# Patient Record
Sex: Female | Born: 2000 | Hispanic: No | Marital: Single | Smoking: Never smoker
Health system: Southern US, Academic
[De-identification: ages and names within clinical notes are randomized; demographics above are authoritative.]

## PROBLEM LIST (undated history)

## (undated) DIAGNOSIS — Z9889 Other specified postprocedural states: Secondary | ICD-10-CM

## (undated) DIAGNOSIS — F32A Depression, unspecified: Secondary | ICD-10-CM

## (undated) DIAGNOSIS — R110 Nausea: Secondary | ICD-10-CM

## (undated) DIAGNOSIS — N390 Urinary tract infection, site not specified: Secondary | ICD-10-CM

## (undated) DIAGNOSIS — E039 Hypothyroidism, unspecified: Secondary | ICD-10-CM

## (undated) DIAGNOSIS — Z87898 Personal history of other specified conditions: Secondary | ICD-10-CM

## (undated) DIAGNOSIS — S92909A Unspecified fracture of unspecified foot, initial encounter for closed fracture: Secondary | ICD-10-CM

## (undated) DIAGNOSIS — K589 Irritable bowel syndrome without diarrhea: Secondary | ICD-10-CM

## (undated) DIAGNOSIS — E669 Obesity, unspecified: Secondary | ICD-10-CM

## (undated) DIAGNOSIS — E063 Autoimmune thyroiditis: Secondary | ICD-10-CM

## (undated) DIAGNOSIS — Z86718 Personal history of other venous thrombosis and embolism: Secondary | ICD-10-CM

## (undated) DIAGNOSIS — R112 Nausea with vomiting, unspecified: Secondary | ICD-10-CM

## (undated) DIAGNOSIS — F17200 Nicotine dependence, unspecified, uncomplicated: Secondary | ICD-10-CM

## (undated) DIAGNOSIS — F411 Generalized anxiety disorder: Secondary | ICD-10-CM

## (undated) DIAGNOSIS — E559 Vitamin D deficiency, unspecified: Secondary | ICD-10-CM

## (undated) DIAGNOSIS — N809 Endometriosis, unspecified: Secondary | ICD-10-CM

## (undated) DIAGNOSIS — K5900 Constipation, unspecified: Secondary | ICD-10-CM

## (undated) DIAGNOSIS — F410 Panic disorder [episodic paroxysmal anxiety] without agoraphobia: Secondary | ICD-10-CM

## (undated) DIAGNOSIS — R519 Headache, unspecified: Secondary | ICD-10-CM

## (undated) DIAGNOSIS — G43909 Migraine, unspecified, not intractable, without status migrainosus: Secondary | ICD-10-CM

## (undated) DIAGNOSIS — F419 Anxiety disorder, unspecified: Secondary | ICD-10-CM

## (undated) HISTORY — DX: Unspecified fracture of unspecified foot, initial encounter for closed fracture: S92.909A

## (undated) HISTORY — PX: HX GALL BLADDER SURGERY/CHOLE: SHX55

## (undated) HISTORY — DX: Autoimmune thyroiditis: E06.3

## (undated) HISTORY — DX: Hypothyroidism, unspecified: E03.9

## (undated) HISTORY — PX: HX TONSILLECTOMY: SHX27

## (undated) HISTORY — PX: HX DENTAL EXTRACTION: 2100001168

## (undated) HISTORY — DX: Headache, unspecified: R51.9

## (undated) HISTORY — DX: Generalized anxiety disorder: F41.1

## (undated) HISTORY — PX: HX APPENDECTOMY: SHX54

## (undated) HISTORY — PX: HX TONSIL AND ADENOIDECTOMY: SHX28

## (undated) HISTORY — PX: GALLBLADDER SURGERY: SHX652

## (undated) HISTORY — DX: Obesity, unspecified: E66.9

## (undated) HISTORY — DX: Vitamin D deficiency, unspecified: E55.9

## (undated) HISTORY — PX: LAPAROSCOPIC ENDOMETRIOSIS FULGURATION: SUR769

## (undated) HISTORY — DX: Nausea: R11.0

## (undated) HISTORY — DX: Endometriosis, unspecified: N80.9

## (undated) HISTORY — PX: HX OTHER: 2100001105

## (undated) HISTORY — PX: HX FOOT SURGERY: 2100001154

## (undated) HISTORY — PX: FOOT SURGERY: SHX648

---
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
## 2018-02-09 ENCOUNTER — Encounter (INDEPENDENT_AMBULATORY_CARE_PROVIDER_SITE_OTHER): Payer: Self-pay | Admitting: NURSE PRACTITIONER

## 2018-03-01 ENCOUNTER — Encounter (INDEPENDENT_AMBULATORY_CARE_PROVIDER_SITE_OTHER): Payer: Self-pay | Admitting: Family Medicine

## 2018-03-08 ENCOUNTER — Telehealth (INDEPENDENT_AMBULATORY_CARE_PROVIDER_SITE_OTHER): Payer: Self-pay | Admitting: Family Medicine

## 2018-03-08 ENCOUNTER — Encounter (INDEPENDENT_AMBULATORY_CARE_PROVIDER_SITE_OTHER): Payer: Self-pay | Admitting: Family Medicine

## 2018-03-08 NOTE — Telephone Encounter (Signed)
Agree with going to ER.  Thank you

## 2018-03-08 NOTE — Telephone Encounter (Signed)
Call from patient, says that she was woken up at 2:00 am having severe stomach, low back and chest pain.  No shortness of breath, dizziness, arm pain etc.  Patient states that she does have someone at home that can drive her. Encouraged her to go to ER as soon as possible and to have someone drive her there. Stressed importance of having an examination by emergency care provider. Patient states understanding.

## 2018-03-08 NOTE — Telephone Encounter (Signed)
PT GAVE VERBAL CONSENT FOR CARE EVERYWHERE

## 2018-03-09 ENCOUNTER — Inpatient Hospital Stay (HOSPITAL_COMMUNITY)
Admission: EM | Admit: 2018-03-09 | Discharge: 2018-03-09 | Disposition: A | Discharge status: Home / Self Care | Payer: BC Managed Care – PPO | Source: Other Acute Inpatient Hospital

## 2018-03-09 DIAGNOSIS — R1031 Right lower quadrant pain: Secondary | ICD-10-CM

## 2018-03-09 NOTE — Telephone Encounter (Signed)
Agree with going to emergency room.  Thank you

## 2018-03-09 NOTE — Telephone Encounter (Signed)
Voicemail from patient, states she wants to talk about symptoms that she called in with yesterday.  Called patient, no answer, left message on machine for patient to call office back.

## 2018-03-09 NOTE — Telephone Encounter (Signed)
Call back from patient crying.  States that she went to [facility] yesterday and was told that she had appendicitis and if symptoms worsened to call PCP.  Patient says that she is having severe stomach and back pain and feels like she is going to vomit.  Advised patient to go to Ed immediately.  Patient states that she works for [employer] and is at work until 4 today.  Says that her boss told her she cannot leave until she finds someone to cover her shift.  Informed patient that she needs to go to ED for her own health and that this is something serious that needs to be taken care of. Patient begging for office to call her work because she says she cannot afford to lose her job.  Called [facility] in [place] and spoke with Alyssa to obtain records from yesterdays visit. Office notes from yesterday to be faxed. Reports obtained and scanned to chart.     Spoke with [employer] from [employer], advised of situation per patient.  Informed them that our office did not examine the patient.  We are going off of patients explanation of symptoms.  Staff and nurse in agreement that if patient has appendicitis and is having severe abdominal pain that she should report to ED.

## 2018-03-19 ENCOUNTER — Encounter (INDEPENDENT_AMBULATORY_CARE_PROVIDER_SITE_OTHER): Payer: Self-pay

## 2018-03-27 ENCOUNTER — Encounter (INDEPENDENT_AMBULATORY_CARE_PROVIDER_SITE_OTHER): Payer: Self-pay

## 2018-05-16 ENCOUNTER — Encounter (INDEPENDENT_AMBULATORY_CARE_PROVIDER_SITE_OTHER): Payer: Self-pay

## 2018-05-17 ENCOUNTER — Ambulatory Visit (INDEPENDENT_AMBULATORY_CARE_PROVIDER_SITE_OTHER): Payer: BC Managed Care – PPO

## 2018-05-17 ENCOUNTER — Encounter (INDEPENDENT_AMBULATORY_CARE_PROVIDER_SITE_OTHER): Payer: Self-pay

## 2018-05-17 VITALS — BP 124/88 | HR 80 | Temp 98.1°F | Ht 63.0 in | Wt 194.2 lb

## 2018-05-17 DIAGNOSIS — E559 Vitamin D deficiency, unspecified: Secondary | ICD-10-CM

## 2018-05-17 DIAGNOSIS — F419 Anxiety disorder, unspecified: Secondary | ICD-10-CM

## 2018-05-17 DIAGNOSIS — E669 Obesity, unspecified: Secondary | ICD-10-CM

## 2018-05-17 DIAGNOSIS — Z862 Personal history of diseases of the blood and blood-forming organs and certain disorders involving the immune mechanism: Secondary | ICD-10-CM

## 2018-05-17 MED ORDER — HYDROXYZINE HCL 10 MG TABLET
ORAL_TABLET | ORAL | 0 refills | Status: DC
Start: 2018-05-17 — End: 2018-05-28

## 2018-05-17 MED ORDER — ESCITALOPRAM 5 MG TABLET
5.0000 mg | ORAL_TABLET | Freq: Every day | ORAL | 0 refills | Status: DC
Start: 2018-05-17 — End: 2018-06-26

## 2018-05-17 NOTE — Nursing Note (Signed)
Pt here to discuss questions about her anxiety meds

## 2018-05-17 NOTE — Progress Notes (Signed)
[facility]  [address] [place] [address]  Phone: [phone]  Fax: [phone]    Encounter Date: 05/17/2018    Patient ID:  Traci [name]  HQR:[id number]    DOB: December 19, 2000  Age: 18 y.o. female    Subjective:     Chief Complaint   Patient presents with   . Medication Question     Pt here to discuss anxiety meds     Patient is here for acute visit. She complains of anxiety, onset "from high school". She was treated with Prozac and Zoloft in the past, but "did not feel right on medications". GAD7 is 16 today. Patient "worries about everything",  crying often, panic attacks+ (chest tightness, can not breathe, needs to go outside, lasting for few minutes). Patient works full time, will go back to school. Appetite: "some days I eat a little, some days  I overeat". Sleep: no insomnia, but due to anxiety sometimes it is difficult to fall asleep.  Depression, PHQ 9 is 13, no SI/SP/HI/HP. Patient denies ETOH/Drugs/tobacco use. She exercises.She denies periods of increased energy/insomnia/excessive spending/elevated mood.  History of anemia:no bleeding, not on supplements; history of Vit D deficiency; high BMI: started GYM 4 times a week, feels good when exercises, history of hypothyroidism: off medications for 2 years; endometriosis: UTD with GYN, can not take OCP, uses condoms, sex active with female partner, patient is working with her GYN closely.        No current outpatient medications on file.     Allergies   Allergen Reactions   . Sulfamethoxazole-Trimethoprim Hives/ Urticaria     Past Medical History:   Diagnosis Date   . Anxiety state    . Foot fracture    . Gastroesophageal reflux disease    . HA (headache)    . Hashimoto's thyroiditis    . Hemorrhage of right ovary    . Hypothyroidism    . Obesity          Past Surgical History:   Procedure Laterality Date   . GALLBLADDER SURGERY     . HX TONSIL AND ADENOIDECTOMY           Family Medical History:     None            Social  History     Tobacco Use   . Smoking status: Never Smoker   . Smokeless tobacco: Never Used   Substance Use Topics   . Alcohol use: Never     Frequency: Never   . Drug use: Never       Review of Systems   Constitutional: Positive for appetite change. Negative for activity change, chills, fatigue, fever and unexpected weight change.   HENT: Negative for congestion and sore throat.    Eyes: Negative for visual disturbance.   Respiratory: Negative for cough, shortness of breath and wheezing.    Cardiovascular: Negative.    Gastrointestinal: Negative for abdominal pain, constipation, diarrhea, nausea and vomiting.   Genitourinary: Negative for dysuria.   Musculoskeletal: Negative for joint swelling.   Skin: Negative for rash.   Neurological: Negative for dizziness and headaches.   Psychiatric/Behavioral: Positive for dysphoric mood and sleep disturbance. Negative for agitation, confusion, hallucinations, self-injury and suicidal ideas. The patient is nervous/anxious. The patient is not hyperactive.      Objective:   Vitals: BP 124/88 (Site: Left, Patient Position: Sitting, Cuff Size: Adult)   Pulse 80   Temp 36.7 C (98.1 F) (Tympanic)  Ht 1.6 m (5\' 3" )   Wt 88.1 kg (194 lb 3.2 oz)   SpO2 99%   BMI 34.40 kg/m   97 %ile (Z= 1.88) based on CDC (Girls, 2-20 Years) BMI-for-age based on BMI available as of 05/17/2018.      Physical Exam  Constitutional:       General: She is not in acute distress.     Appearance: She is not ill-appearing.   HENT:      Head: Normocephalic and atraumatic.      Right Ear: Tympanic membrane, ear canal and external ear normal.      Left Ear: Tympanic membrane, ear canal and external ear normal.      Mouth/Throat:      Mouth: Mucous membranes are moist.      Pharynx: Oropharynx is clear.   Eyes:      Conjunctiva/sclera: Conjunctivae normal.      Pupils: Pupils are equal, round, and reactive to light.   Neck:      Musculoskeletal: Normal range of motion.      Thyroid: No thyroid mass or  thyroid tenderness.      Comments: Thyroid fullness+  Cardiovascular:      Rate and Rhythm: Normal rate and regular rhythm.   Pulmonary:      Effort: Pulmonary effort is normal.      Breath sounds: Normal breath sounds.   Abdominal:      General: Bowel sounds are normal. There is no distension.      Palpations: Abdomen is soft.      Tenderness: There is no abdominal tenderness.      Comments: Old surgical scars (small)   Musculoskeletal:      Right lower leg: No edema.      Left lower leg: No edema.   Lymphadenopathy:      Cervical: No cervical adenopathy.   Skin:     General: Skin is warm and dry.      Findings: No rash.   Neurological:      General: No focal deficit present.      Mental Status: She is alert and oriented to person, place, and time.   Psychiatric:         Attention and Perception: Attention normal.         Mood and Affect: Mood normal.         Speech: Speech normal.         Behavior: Behavior normal.         Thought Content: Thought content normal.         Cognition and Memory: Cognition normal.         Assessment & Plan:     1. Anxiety  - OUTSIDE CONSULT/REFERRAL PROVIDER(AMB)  - TSH w/ Free T4 Reflex; Future  - CBC/DIFF; Future  - Comp Metabolic Panel-Fasting; Future  - hydrOXYzine HCl (ATARAX) 10 mg Oral Tablet; Use one tablet at bedtime as needed for anxiety  Dispense: 10 Tab; Refill: 0  - escitalopram oxalate (LEXAPRO) 5 mg Oral Tablet; Take 1 Tab (5 mg total) by mouth Once a day for 30 days  Dispense: 30 Tab; Refill: 0    2. Obesity (BMI 30.0-34.9)  - TSH w/ Free T4 Reflex; Future  - Comp Metabolic Panel-Fasting; Future    3. Vitamin D deficiency  - Vitamin D; Future    4. History of anemia  - CBC/DIFF; Future  - Ferritin; Future  - Comp Metabolic Panel-Fasting; Future  Gara Kroner, MD

## 2018-05-18 ENCOUNTER — Encounter (INDEPENDENT_AMBULATORY_CARE_PROVIDER_SITE_OTHER): Payer: Self-pay

## 2018-05-28 ENCOUNTER — Telehealth (INDEPENDENT_AMBULATORY_CARE_PROVIDER_SITE_OTHER): Payer: Self-pay | Admitting: Family

## 2018-05-28 DIAGNOSIS — F419 Anxiety disorder, unspecified: Secondary | ICD-10-CM

## 2018-05-28 MED ORDER — HYDROXYZINE HCL 10 MG TABLET
ORAL_TABLET | ORAL | 0 refills | Status: DC
Start: 2018-05-28 — End: 2018-06-26

## 2018-05-28 NOTE — Telephone Encounter (Signed)
Called patient, no answer, mailbox full and unable to leave message.

## 2018-05-28 NOTE — Telephone Encounter (Signed)
Pt called and made aware

## 2018-05-28 NOTE — Telephone Encounter (Signed)
Ok, I will send refill.

## 2018-05-28 NOTE — Telephone Encounter (Signed)
PT CALLED AND SAID THE HYDROXYZINE MEDS YOU GAVE HER FOR HER ANXIETY SEEMS TO BE WORKING VERY WELL.  YOU  HAD GIVEN HER A 10 DAY SUPPLY.  SHE HAD TO PUSH HER NEXT APPT BACK ABOUT A WEEK AND ASKED IF SHE COULD GET ANOTHER SHORT SUPPLY UNTIL SHE COMES IN FOR HER NEXT APPT.

## 2018-05-31 ENCOUNTER — Encounter (INDEPENDENT_AMBULATORY_CARE_PROVIDER_SITE_OTHER): Payer: Self-pay | Admitting: Family

## 2018-06-12 ENCOUNTER — Encounter (INDEPENDENT_AMBULATORY_CARE_PROVIDER_SITE_OTHER): Payer: Self-pay | Admitting: Family

## 2018-06-19 ENCOUNTER — Encounter (INDEPENDENT_AMBULATORY_CARE_PROVIDER_SITE_OTHER): Payer: Self-pay

## 2018-06-25 ENCOUNTER — Encounter (INDEPENDENT_AMBULATORY_CARE_PROVIDER_SITE_OTHER): Payer: Self-pay

## 2018-06-26 ENCOUNTER — Other Ambulatory Visit (INDEPENDENT_AMBULATORY_CARE_PROVIDER_SITE_OTHER): Payer: Self-pay | Admitting: Family Medicine

## 2018-06-26 DIAGNOSIS — F419 Anxiety disorder, unspecified: Secondary | ICD-10-CM

## 2018-06-26 MED ORDER — ESCITALOPRAM 5 MG TABLET
5.0000 mg | ORAL_TABLET | Freq: Every day | ORAL | 0 refills | Status: DC
Start: 2018-06-26 — End: 2018-07-03

## 2018-06-26 MED ORDER — HYDROXYZINE HCL 10 MG TABLET: Tab | ORAL | 0 refills | 0 days | Status: DC

## 2018-06-26 NOTE — Telephone Encounter (Signed)
Patient requesting refill of Lexapro and hydroxyzine. She tells me that she is completely out of medication.     Last OV: 05/17/18  Scheduled OV: 07/03/18  Last Filled:  05/17/18    Refills pended.

## 2018-07-03 ENCOUNTER — Encounter (INDEPENDENT_AMBULATORY_CARE_PROVIDER_SITE_OTHER): Payer: Self-pay

## 2018-07-03 ENCOUNTER — Ambulatory Visit (INDEPENDENT_AMBULATORY_CARE_PROVIDER_SITE_OTHER): Payer: BC Managed Care – PPO

## 2018-07-03 ENCOUNTER — Other Ambulatory Visit: Payer: Self-pay

## 2018-07-03 VITALS — BP 116/76 | HR 84 | Temp 97.6°F | Resp 16 | Ht 63.0 in | Wt 192.4 lb

## 2018-07-03 DIAGNOSIS — R1013 Epigastric pain: Principal | ICD-10-CM

## 2018-07-03 DIAGNOSIS — E669 Obesity, unspecified: Secondary | ICD-10-CM

## 2018-07-03 DIAGNOSIS — H538 Other visual disturbances: Secondary | ICD-10-CM

## 2018-07-03 DIAGNOSIS — F419 Anxiety disorder, unspecified: Secondary | ICD-10-CM

## 2018-07-03 DIAGNOSIS — N809 Endometriosis, unspecified: Secondary | ICD-10-CM

## 2018-07-03 LAB — COMPREHENSIVE METABOLIC PNL, FASTING
ALBUMIN: 4.3
ALKALINE PHOSPHATASE: 60
ALT (SGPT): 14
ANION GAP: 9
AST (SGOT): 17
BILIRUBIN, TOTAL: 0.4
BUN: 8
CALCIUM: 9.1
CARBON DIOXIDE: 22
CHLORIDE: 107
CREATININE: 0.7
ESTIMATED GLOMERULAR FILTRATION RATE: >60
GLUCOSE, FASTING: 82
POTASSIUM: 3.8
SODIUM: 138
SODIUM: 138
TOTAL PROTEIN: 7.5

## 2018-07-03 LAB — CBC/DIFF
BASOS ABS: 0
EOS ABS: 0.5
HCT: 38.5
HGB: 12.9
LYMPHS ABS: 21.2
MCH: 27.5
MCHC: 33.4
MCV: 82.2
MONOS ABS: 5.7
PLATELET COUNT: 280
RBC: 4.69
RDW: 13.9
WBC: 10.3

## 2018-07-03 LAB — THYROID STIMULATING HORMONE (SENSITIVE TSH): TSH: 1.48

## 2018-07-03 LAB — FERRITIN: FERRITIN: 22

## 2018-07-03 LAB — VITAMIN D: VITAMIN D: 23 ng/mL

## 2018-07-03 MED ORDER — ESCITALOPRAM 10 MG TABLET
10.0000 mg | ORAL_TABLET | Freq: Every day | ORAL | 0 refills | Status: DC
Start: 2018-07-03 — End: 2018-08-03

## 2018-07-03 MED ORDER — HYDROXYZINE HCL 10 MG TABLET
ORAL_TABLET | ORAL | 0 refills | Status: DC
Start: 2018-07-03 — End: 2018-11-03

## 2018-07-03 NOTE — Progress Notes (Signed)
[facility]  [address] [place] [address]  Phone: [phone]  Fax: [phone]    Encounter Date: 07/03/2018    Patient ID:  Traci [name]  SHU:[id number]    DOB: 2000-08-19  Age: 18 y.o. female    Subjective:     Chief Complaint   Patient presents with   . Follow Up     medication started   . Eye Redness     Left this morning     Patient is here for follow up, needs refill. She forgot to do her blood work.    Anxiety GAD 7 is 15, PHQ 9 is 10. Patient  feels a little better on Lexapro, no side effects. She takes Atarax as needed for    anxiety and insomnia due to it. Patient was referred to counseling, but did not go.    Patient will have laparocsopic surgery 08/01/2018 (endometriosis)      Patient reports abdominal (epigastric) pain after eating bread and pasta, occasionally nausea.      BM: constipation. She tried multiple medications. Patient is under GI care, will see them next week.      She reports left eye redness and blurry vision upon awakening today. She has contact lense.           Current Outpatient Medications   Medication Sig   . escitalopram oxalate (LEXAPRO) 5 mg Oral Tablet Take 1 Tab (5 mg total) by mouth Once a day   . gabapentin (NEURONTIN) 100 mg Oral Capsule TAKE 1 CAPSULE BY MOUTH THREE TIMES DAILY FOR 2 DAYS PRE-OP. THEN TAKE 1 TO 4 CAPSULES THREE TIMES DAILY POST-OP AS NEEDED FOR PAIN NOT ADEQ   . hydroxyzine HCL (ATARAX) 10 mg Oral Tablet Use one tablet at bedtime as needed for anxiety   . Ibuprofen (MOTRIN) 800 mg Oral Tablet TAKE ONE TABLET BY MOUTH EVERY 8 HOURS BEGINNING 2 DAYS PRIOR TO SURGERY AND CONTINUE FOLLOWING SURGERY AS NEEDED FOR PAIN CONTROL.     Allergies   Allergen Reactions   . Sulfamethoxazole-Trimethoprim Hives/ Urticaria     Past Medical History:   Diagnosis Date   . Anxiety state    . Foot fracture    . Gastroesophageal reflux disease    . HA (headache)    . Hashimoto's thyroiditis    . Hemorrhage of right ovary    .  Hypothyroidism    . Obesity          Past Surgical History:   Procedure Laterality Date   . FOOT SURGERY Right    . GALLBLADDER SURGERY     . HX TONSIL AND ADENOIDECTOMY           Family Medical History:     None            Social History     Tobacco Use   . Smoking status: Never Smoker   . Smokeless tobacco: Never Used   Substance Use Topics   . Alcohol use: Never     Frequency: Never   . Drug use: Never       Review of Systems   Constitutional: Negative.    HENT: Negative for congestion, ear pain, rhinorrhea, sore throat and trouble swallowing.    Eyes: Positive for redness and visual disturbance. Negative for photophobia, pain, discharge and itching.   Respiratory: Negative for cough, shortness of breath and wheezing.    Cardiovascular: Negative.    Gastrointestinal: Positive for abdominal pain,  constipation and nausea. Negative for blood in stool and vomiting.   Genitourinary: Negative for dysuria and hematuria.   Musculoskeletal: Negative for arthralgias.   Skin: Negative for rash.   Neurological: Negative for dizziness and headaches.   Psychiatric/Behavioral: Negative for confusion, hallucinations, self-injury and suicidal ideas. The patient is nervous/anxious.      Objective:   Vitals: BP 116/76 (Site: Left, Patient Position: Sitting, Cuff Size: Adult)   Pulse 84   Temp 36.4 C (97.6 F) (Tympanic)   Resp 16   Ht 1.6 m (5\' 3" )   Wt 87.3 kg (192 lb 6.4 oz)   SpO2 98%   BMI 34.08 kg/m   97 %ile (Z= 1.85) based on CDC (Girls, 2-20 Years) BMI-for-age based on BMI available as of 07/03/2018.      Physical Exam  Constitutional:       General: She is not in acute distress.     Appearance: She is obese.   HENT:      Head: Normocephalic and atraumatic.      Right Ear: Tympanic membrane, ear canal and external ear normal.      Left Ear: Tympanic membrane, ear canal and external ear normal.      Nose: Nose normal.      Mouth/Throat:      Mouth: Mucous membranes are moist.      Pharynx: Oropharynx is clear.      Eyes:      General: Lids are normal.         Right eye: No discharge.         Left eye: No discharge.      Conjunctiva/sclera:      Right eye: Right conjunctiva is not injected. No exudate.     Left eye: Left conjunctiva is injected. No exudate.     Pupils: Pupils are equal, round, and reactive to light.   Cardiovascular:      Rate and Rhythm: Normal rate and regular rhythm.      Pulses: Normal pulses.   Pulmonary:      Effort: Pulmonary effort is normal.      Breath sounds: Normal breath sounds.   Abdominal:      General: Bowel sounds are normal. There is no distension.      Palpations: Abdomen is soft.      Tenderness: There is abdominal tenderness. There is no guarding or rebound.      Comments: Mild epigastric, RUQ tenderness   Musculoskeletal:      Right lower leg: No edema.      Left lower leg: No edema.   Lymphadenopathy:      Cervical: No cervical adenopathy.   Skin:     General: Skin is warm and dry.      Findings: No rash.   Neurological:      General: No focal deficit present.      Mental Status: She is alert and oriented to person, place, and time.   Psychiatric:         Mood and Affect: Mood normal.         Behavior: Behavior normal.         Assessment & Plan:     1. Epigastric pain - mild, diet related, patient will see GI next week (established), she will continue diet (small frequent meals, to avoid bread/pasta)  - Celiac Screening Profile, IGA with Reflex to IGG, Serum; Future    2. Anxiety- Lexapro dose increased, Atarax as needed. She will do blood  work today.  - escitalopram oxalate (LEXAPRO) 10 mg Oral Tablet; Take 1 Tab (10 mg total) by mouth Once a day  Dispense: 30 Tab; Refill: 0  - hydroxyzine HCL (ATARAX) 10 mg Oral Tablet; Use one tablet at bedtime as needed for anxiety  Dispense: 30 Tab; Refill: 0    3. Obesity (BMI 30.0-34.9)    4. Blurry vision, left eye- for one day, patient will call her ophthalmologist today (uses contact lenses)    5. Endometriosis- follow up with GYN, for surgery  08/01/18        Gara Kroner, MD

## 2018-07-03 NOTE — Nursing Note (Signed)
Follow for starting medication, patient also states she woke up and her Left eye is pink and bothering her.

## 2018-07-04 ENCOUNTER — Other Ambulatory Visit (INDEPENDENT_AMBULATORY_CARE_PROVIDER_SITE_OTHER): Payer: Self-pay

## 2018-07-04 DIAGNOSIS — F419 Anxiety disorder, unspecified: Secondary | ICD-10-CM

## 2018-07-04 DIAGNOSIS — Z862 Personal history of diseases of the blood and blood-forming organs and certain disorders involving the immune mechanism: Secondary | ICD-10-CM

## 2018-07-04 DIAGNOSIS — E669 Obesity, unspecified: Secondary | ICD-10-CM

## 2018-07-05 ENCOUNTER — Encounter (INDEPENDENT_AMBULATORY_CARE_PROVIDER_SITE_OTHER): Payer: Self-pay

## 2018-07-06 ENCOUNTER — Telehealth (INDEPENDENT_AMBULATORY_CARE_PROVIDER_SITE_OTHER): Payer: Self-pay

## 2018-07-06 ENCOUNTER — Other Ambulatory Visit (INDEPENDENT_AMBULATORY_CARE_PROVIDER_SITE_OTHER): Payer: Self-pay

## 2018-07-06 DIAGNOSIS — Z862 Personal history of diseases of the blood and blood-forming organs and certain disorders involving the immune mechanism: Secondary | ICD-10-CM

## 2018-07-06 NOTE — Telephone Encounter (Signed)
Called patient, no answer, VM full and unable to leave message.

## 2018-07-06 NOTE — Telephone Encounter (Signed)
-----   Message from Gara Kroner, MD sent at 07/06/2018  9:46 AM EDT -----  Labs: CMP, TSH, CBC are normal. Ferritin low normal.

## 2018-07-09 ENCOUNTER — Other Ambulatory Visit (INDEPENDENT_AMBULATORY_CARE_PROVIDER_SITE_OTHER): Payer: Self-pay

## 2018-07-09 DIAGNOSIS — E559 Vitamin D deficiency, unspecified: Secondary | ICD-10-CM

## 2018-07-09 NOTE — Telephone Encounter (Signed)
Patient aware and verbalized understanding. °

## 2018-07-12 ENCOUNTER — Telehealth (INDEPENDENT_AMBULATORY_CARE_PROVIDER_SITE_OTHER): Payer: Self-pay | Admitting: Family Medicine

## 2018-07-12 NOTE — Telephone Encounter (Signed)
-----   Message from Peter Minium, MD sent at 07/09/2018  2:11 PM EDT -----  Vitamin-D is still low.  Is she taking vitamin-D supplement?  Thank you

## 2018-07-12 NOTE — Telephone Encounter (Signed)
VM full. Unable to leave message. Will try again later.

## 2018-07-16 NOTE — Telephone Encounter (Signed)
2nd attempt to contact patient   Unable to leave message,

## 2018-07-17 MED ORDER — ERGOCALCIFEROL (VITAMIN D2) 1,250 MCG (50,000 UNIT) CAPSULE: 50000 [IU] | Cap | ORAL | 1 refills | 0 days | Status: AC

## 2018-07-17 NOTE — Telephone Encounter (Signed)
Patient returned phone call. She is informed of results and states she has not been prescribed a Vitamin D supplement in approximately 8 months but would be willing to re-start, if necessary.

## 2018-07-17 NOTE — Telephone Encounter (Signed)
Patient aware and verbalized understanding. °

## 2018-07-17 NOTE — Telephone Encounter (Signed)
I sent a supplement to be taken weekly.  After 4-6 weeks please let us know if she is regularly taking it and will recheck level.  Thank you

## 2018-07-31 ENCOUNTER — Encounter (INDEPENDENT_AMBULATORY_CARE_PROVIDER_SITE_OTHER): Payer: Self-pay | Admitting: Internal Medicine

## 2018-07-31 ENCOUNTER — Telehealth (INDEPENDENT_AMBULATORY_CARE_PROVIDER_SITE_OTHER): Payer: Self-pay | Admitting: Family Medicine

## 2018-07-31 NOTE — Telephone Encounter (Signed)
I personally offered the service to the patient, and obtained verbal consent to provide this service.    Damian Leavell, MA    Pt has video call schedule for 4/10 @ 1:30. Call pt at [phone]

## 2018-08-02 ENCOUNTER — Encounter (INDEPENDENT_AMBULATORY_CARE_PROVIDER_SITE_OTHER): Payer: Self-pay | Admitting: Internal Medicine

## 2018-08-02 ENCOUNTER — Telehealth (INDEPENDENT_AMBULATORY_CARE_PROVIDER_SITE_OTHER): Payer: Self-pay | Admitting: Internal Medicine

## 2018-08-02 NOTE — Progress Notes (Signed)
[facility]  [address] [place] [address]    Video Visit     Name: Traci [name]  MRN: [id number]    Date: 08/03/2018  Age: 18 y.o.                             Patient's location: Home [name] [address]   Patient/family aware of provider location: Yes  Patient/family consent for video visit: Yes  Interview and observation performed by: Bufford Buttner, MD    Chief Complaint: Anxiety    History of Present Illness:  Traci [name] is a 18 y.o. female who has provided consent for telemedicine audiovisual two-way visit communication.    Visit is for follow-up regarding her anxiety.  She saw Dr. Kelton Pillar at her last appointment was treated with Lexapro and hydroxyzine for her anxiety.  Doses were adjusted.  She feels that the medication change has helped control symptoms and she feels calmer, but she still has some breakthrough complaints of anxiety.  She uses the hydroxyzine about 4 times per week.  She asked if the Lexapro dose can be increased to assist with better control of symptoms.  She denies any specific triggers that her causing symptoms.  No bad dreams.  She has not had any depressive symptoms.  She reports that she was treated in the past with fluoxetine.  She did not like how the medication made her feel so she did not want to restart the medication at her last appointment.  When the anxiety bothers her she reports that her chest hurts.  No complaint of palpitations.    Patient also is on vitamin-D supplement for a low vitamin-D level.  She has not had any injuries or fractures.  She asked about getting a follow-up lab test completed.    Patient is also concerned about urinary symptoms.  She complains of dysuria that started recently with lower abdominal/suprapubic pressure.  She has not noticed any blood in her urine.  She denies any fever or chills.  No flank pain.  Urine has seems somewhat cloudy.  She started drinking more water due to the onset of symptoms.    Past  Medical History:   Diagnosis Date   . Anxiety state    . Foot fracture     Right   . Gastroesophageal reflux disease 02/19/2014   . HA (headache)    . Hashimoto's thyroiditis    . Hemorrhage of right ovary 02/19/2015   . Hypothyroidism    . Obesity    . Vitamin D deficiency      Past Surgical History:   Procedure Laterality Date   . FOOT SURGERY Right    . GALLBLADDER SURGERY      Cholecystectomy   . HX OTHER  10/30/2015    DIAG LAPARO SEPARATE PROC - fulgration of endometriotic implants   . HX OTHER      ORIF Right foot   . HX TONSIL AND ADENOIDECTOMY       Family Medical History:     Problem Relation (Age of Onset)    No Known Problems Mother, Father        Social History     Tobacco Use   . Smoking status: Never Smoker   . Smokeless tobacco: Never Used   Substance Use Topics   . Alcohol use: Never     Frequency: Never   . Drug use: Never  Allergies   Allergen Reactions   . Sulfamethoxazole-Trimethoprim Hives/ Urticaria     Current Outpatient Medications   Medication Sig   . ergocalciferol, vitamin D2, (VITAMIN D) 1,250 mcg (50,000 unit) Oral Capsule Take 1 Cap (50,000 Units total) by mouth Every 7 days   . escitalopram oxalate (LEXAPRO) 10 mg Oral Tablet Take 1 Tab (10 mg total) by mouth Once a day   . hydroxyzine HCL (ATARAX) 10 mg Oral Tablet Use one tablet at bedtime as needed for anxiety   . Ibuprofen (MOTRIN) 800 mg Oral Tablet TAKE ONE TABLET BY MOUTH EVERY 8 HOURS BEGINNING 2 DAYS PRIOR TO SURGERY AND CONTINUE FOLLOWING SURGERY AS NEEDED FOR PAIN CONTROL.   . Norethindrone, Contraceptive, (equiv to: ORTHO MICRONOR) 0.35 mg Oral Tablet Take 0.35 mg by mouth Once a day     Patient Active Problem List   Diagnosis   . Anxiety   . Endometriosis   . Hashimoto's thyroiditis   . Iron deficiency   . Lisping   . Overweight (BMI 25.0-29.9)   . Pelvic pain   . Vitamin D deficiency     ROS:  Constitutional:  No fever or chills.  Eyes:  No eye drainage or redness.  Respiratory:  No cough or shortness of  breath.  Cardiovascular:  As noted above.  No edema.  GU:  As noted above.  Musculoskeletal:  No flank pain, injuries or fractures.  Skin:  No rashes or jaundice.  Psych/Behavioral:  Anxiety as noted above.    Observational Exam:   CONSTITUTIONAL:  Conversant and in no acute distress.  EYES:  No icterus.  No eye drainage or redness.  No lid-lag or proptosis.  RESPIRATORY:  Normal respiratory effort.  CARDIOVASCULAR:  No peripheral edema.  MUSCULOSKELETAL:  No cyanosis.  Moving upper and lower extremities without difficulty.  NEURO:  Cranial nerves II-XII grossly intact.  SKIN:  No rash, lesions or jaundice.  PSYCH:  Alert and oriented x3.  Cordial affect.  Did not appear anxious.  Conversant and responsive to questions.  Affect appeared upbeat.    Assessment/Plan:      ICD-10-CM    1. Anxiety F41.9 escitalopram oxalate (LEXAPRO) 20 mg Oral Tablet     ECG (External)   2. Vitamin D deficiency E55.9 VITAMIN D   3. UTI (urinary tract infection) N39.0 nitrofurantoin (MACROBID) 100 mg Oral Capsule   4. Other long term (current) drug therapy Z79.899 ECG (External)     Will change Lexapro to 20 mg daily for her anxiety.  She will continue use of hydroxyzine as needed.  Given use of this combination of medications, will check EKG to assess cardiac rhythm status.  Vitamin-D level ordered to follow-up regarding her vitamin-D deficiency.  Will treat with Macrobid 100 mg twice a day for UTI symptoms.  Encouraged patient to maintain good fluid intake.  Will plan for follow-up telemedicine video visit in 1 month or sooner as needed.  Bufford ButtnerLinda Zhanae Proffit, MD    This dictation was transcribed using voice recognition software and may contain minor typographical errors.

## 2018-08-03 ENCOUNTER — Other Ambulatory Visit: Payer: Self-pay

## 2018-08-03 ENCOUNTER — Encounter (INDEPENDENT_AMBULATORY_CARE_PROVIDER_SITE_OTHER): Payer: Self-pay | Admitting: Internal Medicine

## 2018-08-03 ENCOUNTER — Telehealth: Payer: BC Managed Care – PPO | Admitting: Internal Medicine

## 2018-08-03 ENCOUNTER — Encounter (INDEPENDENT_AMBULATORY_CARE_PROVIDER_SITE_OTHER): Payer: Self-pay

## 2018-08-03 DIAGNOSIS — Z79899 Other long term (current) drug therapy: Secondary | ICD-10-CM

## 2018-08-03 DIAGNOSIS — F419 Anxiety disorder, unspecified: Secondary | ICD-10-CM

## 2018-08-03 DIAGNOSIS — Z7189 Other specified counseling: Secondary | ICD-10-CM | POA: Insufficient documentation

## 2018-08-03 DIAGNOSIS — N390 Urinary tract infection, site not specified: Secondary | ICD-10-CM

## 2018-08-03 DIAGNOSIS — E559 Vitamin D deficiency, unspecified: Secondary | ICD-10-CM

## 2018-08-03 MED ORDER — ESCITALOPRAM 20 MG TABLET
20.0000 mg | ORAL_TABLET | Freq: Every day | ORAL | 1 refills | Status: DC
Start: 2018-08-03 — End: 2019-07-05

## 2018-08-03 MED ORDER — NITROFURANTOIN MONOHYDRATE/MACROCRYSTALS 100 MG CAPSULE
100.00 mg | ORAL_CAPSULE | Freq: Two times a day (BID) | ORAL | 0 refills | Status: AC
Start: 2018-08-03 — End: 2018-08-06

## 2018-08-03 NOTE — Telephone Encounter (Signed)
I personally offered the service to the patient, and obtained verbal consent to provide this service.    Traci [name]    Scheduled patient for 1 month follow up video visit on 09/03/2018 @ 3:15    Mailed scripts to patient.    Phone [phone]

## 2018-08-03 NOTE — Telephone Encounter (Signed)
Left message for patient to call back to schedule follow up video call.

## 2018-08-03 NOTE — Telephone Encounter (Signed)
Telemedicine video visit completed.  Please see check out instructions/orders.

## 2018-08-07 ENCOUNTER — Encounter (INDEPENDENT_AMBULATORY_CARE_PROVIDER_SITE_OTHER): Payer: Self-pay | Admitting: Family Medicine

## 2018-08-07 ENCOUNTER — Telehealth (INDEPENDENT_AMBULATORY_CARE_PROVIDER_SITE_OTHER): Payer: Self-pay | Admitting: Family Medicine

## 2018-08-07 NOTE — Telephone Encounter (Signed)
I personally offered the service to the patient, and obtained verbal consent to provide this service.    Traci [name]    Changed patients video visit to 08/08/2018 @ 2:30    Phone [phone]

## 2018-08-07 NOTE — Telephone Encounter (Signed)
I personally offered the service to the patient, and obtained verbal consent to provide this service.    Franciso Bend, RN    Patient reports constipation for the past 1.5 weeks. She has not had a normal BM in this time. She has been trying Miralax OTC but it has been ineffective.     Patient's number: [phone]  Pharmacy confirmed.     Patient has used the video chat on 08/03/18.

## 2018-08-08 ENCOUNTER — Encounter (INDEPENDENT_AMBULATORY_CARE_PROVIDER_SITE_OTHER): Payer: Self-pay | Admitting: Family Medicine

## 2018-08-22 ENCOUNTER — Telehealth (INDEPENDENT_AMBULATORY_CARE_PROVIDER_SITE_OTHER): Payer: Self-pay

## 2018-08-22 NOTE — Telephone Encounter (Signed)
Patient reports she has been vomiting and unable to keep any food or fluids down for the past three days except for small sips of water. She states she is in a lot of abdominal pain and sweating a lot. I advised her to report to the ED. She verbalized understanding.

## 2018-09-03 ENCOUNTER — Encounter (INDEPENDENT_AMBULATORY_CARE_PROVIDER_SITE_OTHER): Payer: Self-pay | Admitting: Internal Medicine

## 2018-09-13 ENCOUNTER — Encounter (INDEPENDENT_AMBULATORY_CARE_PROVIDER_SITE_OTHER): Payer: Self-pay | Admitting: Internal Medicine

## 2018-09-19 ENCOUNTER — Ambulatory Visit (INDEPENDENT_AMBULATORY_CARE_PROVIDER_SITE_OTHER): Payer: BC Managed Care – PPO | Admitting: Internal Medicine

## 2018-09-19 ENCOUNTER — Other Ambulatory Visit: Payer: Self-pay

## 2018-09-19 ENCOUNTER — Encounter (INDEPENDENT_AMBULATORY_CARE_PROVIDER_SITE_OTHER): Payer: Self-pay | Admitting: Internal Medicine

## 2018-09-19 VITALS — BP 122/74 | HR 74 | Temp 97.6°F | Ht 63.0 in | Wt 184.6 lb

## 2018-09-19 DIAGNOSIS — E669 Obesity, unspecified: Secondary | ICD-10-CM

## 2018-09-19 DIAGNOSIS — F419 Anxiety disorder, unspecified: Secondary | ICD-10-CM

## 2018-09-19 DIAGNOSIS — R21 Rash and other nonspecific skin eruption: Secondary | ICD-10-CM

## 2018-09-19 DIAGNOSIS — E559 Vitamin D deficiency, unspecified: Secondary | ICD-10-CM

## 2018-09-19 DIAGNOSIS — R197 Diarrhea, unspecified: Secondary | ICD-10-CM

## 2018-09-19 DIAGNOSIS — Z114 Encounter for screening for human immunodeficiency virus [HIV]: Secondary | ICD-10-CM

## 2018-09-19 MED ORDER — COLESTIPOL 1 GRAM TABLET
1.00 g | ORAL_TABLET | Freq: Two times a day (BID) | ORAL | 1 refills | Status: DC
Start: 2018-09-19 — End: 2018-11-03

## 2018-09-19 NOTE — Progress Notes (Signed)
[facility]  [address]  [place] [address]    Chief Complaint:    Chief Complaint   Patient presents with   . Follow Up     anxiety   . Rash     arms, back     HPI:  Patient is here for follow-up regarding chronic medical problems.  She has anxiety and is on Lexapro for treatment.  She states her family has noted that she seems more calm than she had been in the past and she seems to worry less about things.  She denies any depressive symptoms.  She does note that she sometimes feels anxious.  For example, school started last week and she was a bit anxious because she did have all of her books for her studies.  She does use hydroxyzine as needed.  She used several doses last week due to the school concerns but prior to that she was not using it very often.  She denies any side effects from medication.    Patient is on a vitamin-D supplement due to vitamin-D deficiency.  She has not had any injuries or fractures.  Weight remains elevated and in the obesity category.    Patient has a new complaint of a rash that occurs intermittently on her arms and back.  It seems to happen at night and not during the day.  It is pruritic and it looks red.  She does not feel that the areas of involvement look like hives.  She denies any new soaps, detergents or foods.  No new clothing or bed linens.  She has use Benadryl as needed for the pruritus which resolves the complained.  She does not have any residual the next day.  No one else in the household affected.    Patient also complains of diarrhea which occurs intermittently.  When it occurs she does have abdominal cramping.  She does have a history of cholecystectomy completed last year.  She has tried to watch her diet as it does seem worse with fried food intake.  Denies any vomiting although she has had some nausea at times.    Past Medical History:   Diagnosis Date   . Anxiety state    . Foot fracture     Right   . Gastroesophageal  reflux disease 02/19/2014   . HA (headache)    . Hashimoto's thyroiditis    . Hemorrhage of right ovary 02/19/2015   . Hypothyroidism    . Obesity    . Vitamin D deficiency      Past Surgical History:   Procedure Laterality Date   . FOOT SURGERY Right    . GALLBLADDER SURGERY      Cholecystectomy   . HX OTHER  10/30/2015    DIAG LAPARO SEPARATE PROC - fulgration of endometriotic implants   . HX OTHER      ORIF Right foot   . HX TONSIL AND ADENOIDECTOMY       Family Medical History:     Problem Relation (Age of Onset)    No Known Problems Mother, Father        Social History     Tobacco Use   . Smoking status: Never Smoker   . Smokeless tobacco: Never Used   Substance Use Topics   . Alcohol use: Never     Frequency: Never   . Drug use: Never     Allergies   Allergen Reactions   . Sulfamethoxazole-Trimethoprim  Hives/ Urticaria     Current Outpatient Medications   Medication Sig   . ergocalciferol, vitamin D2, (VITAMIN D) 1,250 mcg (50,000 unit) Oral Capsule Take 1 Cap (50,000 Units total) by mouth Every 7 days   . escitalopram oxalate (LEXAPRO) 20 mg Oral Tablet Take 1 Tab (20 mg total) by mouth Once a day   . hydroxyzine HCL (ATARAX) 10 mg Oral Tablet Use one tablet at bedtime as needed for anxiety   . Ibuprofen (MOTRIN) 800 mg Oral Tablet TAKE ONE TABLET BY MOUTH EVERY 8 HOURS BEGINNING 2 DAYS PRIOR TO SURGERY AND CONTINUE FOLLOWING SURGERY AS NEEDED FOR PAIN CONTROL.   . Norethindrone, Contraceptive, (equiv to: ORTHO MICRONOR) 0.35 mg Oral Tablet Take 0.35 mg by mouth Once a day     ROS:  Constitutional:  No fever or chills.  No recent illnesses.  No exposure to any individuals with coronavirus infection.  Eyes:  No eye drainage or redness.  Respiratory:  No cough or shortness of breath.  GI:  As noted above.  Skin:  As noted above.  No jaundice.  Psych/Behavioral:  Anxiety as noted above.  No depressive symptoms.  Allergic:  Rash as noted above with pruritus.  No urticaria.    Physical Exam:  BP 122/74 (Site:  Left, Patient Position: Sitting, Cuff Size: Adult)   Pulse 74   Temp 36.4 C (97.6 F) (Tympanic)   Ht 1.6 m (5\' 3" )   Wt 83.7 kg (184 lb 9.6 oz)   SpO2 98%   BMI 32.70 kg/m   96 %ile (Z= 1.73) based on CDC (Girls, 2-20 Years) BMI-for-age based on BMI available as of 09/19/2018.    Alert and pleasant.  No acute distress.  HEENT:  Pupils reactive without icterus.  No eye drainage.  TMs without erythema.  Nares patent without drainage.  Moist mucous membranes without erythema or exudate.  Neck:  Supple, nontender without adenopathy or masses.  Lungs:  Clear to auscultation bilaterally.  No wheezing, rales or rhonchi.  Chest wall expansion symmetrical.  No labored breathing.  No cough or tachypnea.  Cardiac:  Regular rate and rhythm.  No murmur, gallop or rub.  No ectopic beats.  No irregularity or tachycardia.  Abdomen:  Soft, nontender, non-distended with normoactive bowel sounds.  No appreciable organomegaly.  Extremities:  Warm with no cyanosis or edema.  No erythema.  Capillary refill less than 3 seconds.  Normal skin turgor.  Neuro:  Speech is clear and tongue is midline.  No facial asymmetry.  No tremor.  Ambulated without use of assistive device.  Skin:  No skin lesions on the back or upper extremities.  No jaundice.  Psych:  Answered questions appropriately.  Did not appear anxious.  Patient was alert and interactive during the visit.  Affect appeared upbeat.      ICD-10-CM    1. Anxiety F41.9    2. Vitamin D deficiency E55.9    3. Obesity (BMI 30.0-34.9) E66.9    4. Rash R21    5. Diarrhea, unspecified type - post cholecystectomy R19.7    6. Encounter for screening for HIV Z11.4 HIV1/HIV2 SCREEN, COMBINED ANTIGEN AND ANTIBODY     Will continue Lexapro for anxiety.  She may use hydroxyzine as needed for anxiety as needed and she may also use this medication for pruritus as well.  Would avoid use of Benadryl with use of the hydroxyzine.  Continue vitamin-D supplement given her vitamin-D deficiency.   Weight and BMI values reviewed.  BMI is in the obesity category.  Encouraged patient to continue working toward weight reduction.  No evidence of skin rash noted on exam today.  Patient did bring a photo on her cell phone today with evidence of mild erythema noted on the upper back region but does not seem typical for urticaria, pustules or vesicles.  Eyedrops design as needed for pruritus.  If symptoms would persist, consider referral to dermatology.  Recommended that she monitor her dietary intake to see if there is any particular food trigger.  Also recommended that she discuss with family members or her significant other regarding any new changes in products or colognes for example that may potentially be contributing to her complaints.  She has a history of cholecystectomy with complaint of diarrhea.  Will try a trial of Colestid 1 g twice a day.  Use the medication dosing reviewed with the patient.  HIV screen ordered.  Follow-up in 8 weeks or sooner as needed.    Bufford Buttner, MD    This dictation was transcribed using voice recognition software and may contain minor typographical errors.

## 2018-09-19 NOTE — Nursing Note (Signed)
Patient is here for a follow up for anxiety, mother notices improvements after starting medication    Also states she has a rash on her arms and back.

## 2018-10-05 ENCOUNTER — Encounter (INDEPENDENT_AMBULATORY_CARE_PROVIDER_SITE_OTHER): Payer: Self-pay | Admitting: Family Medicine

## 2018-10-06 ENCOUNTER — Inpatient Hospital Stay (HOSPITAL_COMMUNITY)
Admission: EM | Admit: 2018-10-06 | Discharge: 2018-10-06 | Disposition: A | Discharge status: Home / Self Care | Payer: BC Managed Care – PPO | Source: Other Acute Inpatient Hospital

## 2018-10-06 DIAGNOSIS — R1110 Vomiting, unspecified: Secondary | ICD-10-CM

## 2018-10-06 DIAGNOSIS — R10817 Generalized abdominal tenderness: Secondary | ICD-10-CM

## 2018-10-06 DIAGNOSIS — K651 Peritoneal abscess: Secondary | ICD-10-CM

## 2018-10-06 DIAGNOSIS — Z9889 Other specified postprocedural states: Secondary | ICD-10-CM | POA: Insufficient documentation

## 2018-10-08 ENCOUNTER — Encounter (INDEPENDENT_AMBULATORY_CARE_PROVIDER_SITE_OTHER): Payer: Self-pay | Admitting: Family Medicine

## 2018-10-09 ENCOUNTER — Encounter (INDEPENDENT_AMBULATORY_CARE_PROVIDER_SITE_OTHER): Payer: Self-pay | Admitting: Family Medicine

## 2018-10-13 DIAGNOSIS — Z8742 Personal history of other diseases of the female genital tract: Secondary | ICD-10-CM

## 2018-10-13 DIAGNOSIS — K651 Peritoneal abscess: Secondary | ICD-10-CM

## 2018-10-13 DIAGNOSIS — D72829 Elevated white blood cell count, unspecified: Secondary | ICD-10-CM

## 2018-10-13 DIAGNOSIS — Z4803 Encounter for change or removal of drains: Secondary | ICD-10-CM

## 2018-10-13 DIAGNOSIS — T8149XD Infection following a procedure, other surgical site, subsequent encounter: Secondary | ICD-10-CM

## 2018-10-15 ENCOUNTER — Encounter (INDEPENDENT_AMBULATORY_CARE_PROVIDER_SITE_OTHER): Payer: Self-pay | Admitting: Family Medicine

## 2018-10-15 MED ORDER — CEFDINIR 300 MG CAPSULE
300.00 mg | ORAL_CAPSULE | ORAL | Status: DC
Start: ? — End: 2018-10-15

## 2018-10-15 MED ORDER — GABAPENTIN 100 MG CAPSULE
100.00 mg | ORAL_CAPSULE | ORAL | Status: DC
Start: 2018-10-12 — End: 2018-10-15

## 2018-10-15 MED ORDER — FAMOTIDINE 20 MG TABLET
20.00 mg | ORAL_TABLET | ORAL | Status: DC
Start: 2018-10-12 — End: 2018-10-15

## 2018-10-15 MED ORDER — OXYCODONE 5 MG TABLET
5.00 mg | ORAL_TABLET | ORAL | Status: DC | PRN
Start: ? — End: 2018-10-15

## 2018-10-15 MED ORDER — ONDANSETRON HCL (PF) 4 MG/2 ML INJECTION SOLUTION
4.00 mg | Freq: Three times a day (TID) | INTRAMUSCULAR | Status: DC
Start: 2018-10-12 — End: 2018-10-15

## 2018-10-15 MED ORDER — ESCITALOPRAM 20 MG TABLET
20.00 mg | ORAL_TABLET | ORAL | Status: DC
Start: 2018-10-13 — End: 2018-10-15

## 2018-10-15 MED ORDER — METOCLOPRAMIDE 5 MG/ML INJECTION SOLUTION
5.00 mg | Freq: Four times a day (QID) | INTRAMUSCULAR | Status: DC | PRN
Start: ? — End: 2018-10-15

## 2018-10-15 MED ORDER — IBUPROFEN 600 MG TABLET
600.00 mg | ORAL_TABLET | Freq: Four times a day (QID) | ORAL | Status: DC
Start: 2018-10-12 — End: 2018-10-15

## 2018-10-15 MED ORDER — OXYCODONE 5 MG TABLET
10.00 mg | ORAL_TABLET | ORAL | Status: DC | PRN
Start: ? — End: 2018-10-15

## 2018-10-15 MED ORDER — METRONIDAZOLE 500 MG TABLET
500.00 mg | ORAL_TABLET | Freq: Two times a day (BID) | ORAL | Status: DC
Start: ? — End: 2018-10-15

## 2018-10-29 ENCOUNTER — Encounter (INDEPENDENT_AMBULATORY_CARE_PROVIDER_SITE_OTHER): Payer: Self-pay | Admitting: Family Medicine

## 2018-11-02 ENCOUNTER — Encounter (INDEPENDENT_AMBULATORY_CARE_PROVIDER_SITE_OTHER): Payer: Self-pay | Admitting: NURSE PRACTITIONER

## 2018-11-03 ENCOUNTER — Encounter (INDEPENDENT_AMBULATORY_CARE_PROVIDER_SITE_OTHER): Payer: Self-pay | Admitting: NURSE PRACTITIONER

## 2018-11-03 ENCOUNTER — Other Ambulatory Visit

## 2018-11-03 ENCOUNTER — Ambulatory Visit (INDEPENDENT_AMBULATORY_CARE_PROVIDER_SITE_OTHER): Payer: BC Managed Care – PPO | Admitting: NURSE PRACTITIONER

## 2018-11-03 VITALS — BP 112/76 | HR 87 | Temp 97.5°F | Ht 63.0 in | Wt 172.2 lb

## 2018-11-03 DIAGNOSIS — M791 Myalgia, unspecified site: Secondary | ICD-10-CM

## 2018-11-03 DIAGNOSIS — R112 Nausea with vomiting, unspecified: Secondary | ICD-10-CM

## 2018-11-03 DIAGNOSIS — Z09 Encounter for follow-up examination after completed treatment for conditions other than malignant neoplasm: Secondary | ICD-10-CM

## 2018-11-03 DIAGNOSIS — N9489 Other specified conditions associated with female genital organs and menstrual cycle: Secondary | ICD-10-CM

## 2018-11-03 DIAGNOSIS — Z5189 Encounter for other specified aftercare: Secondary | ICD-10-CM

## 2018-11-03 DIAGNOSIS — R634 Abnormal weight loss: Secondary | ICD-10-CM

## 2018-11-03 DIAGNOSIS — M2550 Pain in unspecified joint: Secondary | ICD-10-CM

## 2018-11-03 DIAGNOSIS — R5383 Other fatigue: Secondary | ICD-10-CM

## 2018-11-03 MED ORDER — FAMOTIDINE 10 MG TABLET
10.00 mg | ORAL_TABLET | Freq: Two times a day (BID) | ORAL | 0 refills | Status: DC
Start: 2018-11-03 — End: 2019-07-05

## 2018-11-03 MED ORDER — HONEY 80 % TOPICAL GEL
1.0000 | Freq: Two times a day (BID) | CUTANEOUS | 0 refills | Status: AC
Start: 2018-11-03 — End: 2018-11-17

## 2018-11-03 MED ORDER — PROMETHAZINE 12.5 MG RECTAL SUPPOSITORY
12.5000 mg | Freq: Four times a day (QID) | RECTAL | 0 refills | Status: DC | PRN
Start: 2018-11-03 — End: 2019-07-05

## 2018-11-03 NOTE — Nursing Note (Signed)
Pt in office following 2 laparoscopic procedures for endometriosis,  Surgeon states that the nausea and IBS symptoms are not from the surgeries.

## 2018-11-05 NOTE — Progress Notes (Signed)
General Exam    Subjective:    Traci [name] is an 18 y.o. female who is here for a hospital follow-up appointment.  She originally had a laparoscopic excision of endometriosis on October 03, 2018. She was discharged home and presented to the [facility] on October 06, 2018. She was transferred to [facility] with nausea, vomiting, and lower abdominal pain.  While in-patient she was treated for a presumed postoperative infection.  She is receiving IV antibiotics and her WBC increased in spite of antibiotic therapy and a repeat CT scan showed a pelvic hematoma.  Interventional radiology was unable to place a drain and she went back to the OR for a washout.  She had a JP drain placed for a pelvic hematoma in the left lower quadrant.  She was discharged home with 7 days of cefdinir and Flagyl.  She finished her course of antibiotics on Tuesday.  Since being discharged home on October 12, 2018 from the hospital she continues to have nausea and vomiting.  She has lost approximately 12 lbs since her last office visit on Sep 19, 2018. She is following with gynecology Dr. Franciso Bend at [facility].  She tells me Dr. Franciso Bend said something about the possibility of pyoderma gangrenosum.  She does have another CT scan of her abdomen scheduled for this coming Thursday which was ordered by her gynecologist.  She did have her JP drain removed from the left lower quadrant and has home care coming in once a week.  She tells me she has a small wound in the left lower quadrant of her abdomen.  She denies fever or chills.  For the nausea and vomiting she tells me Reglan did not help however, Zofran helps a little.  She says she is having fatigue, muscle soreness, and joint soreness all over.    Allergies   Allergen Reactions   . Sulfamethoxazole-Trimethoprim Hives/ Urticaria       Current Outpatient Medications:   .  escitalopram oxalate (LEXAPRO) 20 mg Oral Tablet, Take 1 Tab (20 mg total) by mouth Once a day,  Disp: 90 Tab, Rfl: 1  .  famotidine (PEPCID) 10 mg Oral Tablet, Take 1 Tab (10 mg total) by mouth Twice daily, Disp: 60 Tab, Rfl: 0  .  hydrOXYzine pamoate (VISTARIL) 25 mg Oral Capsule, Take 25 mg by mouth Three times a day as needed for Itching, Disp: , Rfl:   .  Ibuprofen (MOTRIN) 800 mg Oral Tablet, TAKE ONE TABLET BY MOUTH EVERY 8 HOURS BEGINNING 2 DAYS PRIOR TO SURGERY AND CONTINUE FOLLOWING SURGERY AS NEEDED FOR PAIN CONTROL., Disp: , Rfl:   .  Lactobac no.41/Bifidobact no.7 (PROBIOTIC-10 ORAL), Take by mouth, Disp: , Rfl:   .  leptospermum honey (MEDIHONEY, HONEY,) 80 % Gel, 1 Tube by Apply Topically route Twice daily for 14 days, Disp: 1 Tube, Rfl: 0  .  Norethindrone, Contraceptive, (equiv to: ORTHO MICRONOR) 0.35 mg Oral Tablet, Take 0.35 mg by mouth Once a day, Disp: , Rfl:   .  ondansetron (ZOFRAN) 4 mg Oral Tablet, Take 4 mg by mouth Every 8 hours as needed for Nausea/Vomiting, Disp: , Rfl:   .  promethazine (PHENERGAN) 12.5 mg Rectal Suppository, 1 Suppository (12.5 mg total) by Rectal route Every 6 hours as needed for Nausea/Vomiting, Disp: 14 Suppository, Rfl: 0  Past Medical History:   Diagnosis Date   . Anxiety state    . Endometriosis    . Foot fracture  Right   . Gastroesophageal reflux disease 02/19/2014   . HA (headache)    . Hashimoto's thyroiditis    . Hemorrhage of right ovary 02/19/2015   . Hypothyroidism    . Obesity    . Vitamin D deficiency          Past Surgical History:   Procedure Laterality Date   . FOOT SURGERY Right    . GALLBLADDER SURGERY      Cholecystectomy   . HX OTHER  10/30/2015    DIAG LAPARO SEPARATE PROC - fulgration of endometriotic implants   . HX OTHER      ORIF Right foot   . HX TONSIL AND ADENOIDECTOMY     . LAPAROSCOPIC ENDOMETRIOSIS FULGURATION     . LAPAROSCOPIC ENDOMETRIOSIS FULGURATION      2nd surgery was to also drain a blood clot.     Social History     Socioeconomic History   . Marital status: Single     Spouse name: Not on file   . Number of children:  Not on file   . Years of education: Not on file   . Highest education level: Not on file   Tobacco Use   . Smoking status: Never Smoker   . Smokeless tobacco: Never Used   Substance and Sexual Activity   . Alcohol use: Never     Frequency: Never   . Drug use: Never   . Sexual activity: Yes     Birth control/protection: Condom       ROS:  General:  + weight loss and fatigue.  Denies fevers or chills.  Eyes:  Denies visual changes.  HENT:  Denies nasal congestion, hearing loss, ear pain, sore throat.  Neck:  Denies neck fullness, pain.  Heart:  Denies chest pain, palpitations.  Lungs:  Denies SOB, pleuritic pain, cough.  Gastrointestinal:  + nausea and vomiting.  Denies any abd pain, diarrhea, or constipation.  Genitourinary: Denies any dysuria or hematuria  Musculoskeletal: + any joint pain, + muscle pain. Denies  edema.  Neurologic: Denies headaches, numbness, tingling.  Integumentary : Denies any rashes or lesions.  + abdominal wound LLQ.     Otherwise all other review of systems are negative    Objective:    Physical Exam:  Vitals:    11/03/18 0920   BP: 112/76   Pulse: 87   Temp: 36.4 C (97.5 F)   TempSrc: Thermal Scan   SpO2: 98%   Weight: 78.1 kg (172 lb 3.2 oz)   Height: 1.6 m (5\' 3" )   BMI: 30.57       General:  Well-appearing in no acute distress.  Skin:  Warm, dry, normal.  Without rash, lesion.  Eyes:  Pupils are reactive to light.  EOM intact.  Conjunctiva pink.  No scleral icterus.  HENT:  Gross hearing intact.  Ear canals without cerumen impaction.  Tympanic membranes without erythema, effusion.  No pharyngeal lesions, exudate.  Tongue midline.  Neck:  Supple.  No JVD, thyromegaly, lymphadenopathy.  No carotid bruits.  Heart:  RRR.  No murmur, heave, lift, thrill.  Heart sounds are normal.  No S3, S4 gallop.  Lungs:  Lungs are clear to auscultation bilaterally.  No wheezes, crackles, rubs.  Abdomen:  Nontender.  No hepatomegaly, masses, bruits.  Bowel sounds are normal. + 1 cm wound LLQ from removed  JP site.  There is minimal clear drainage.  No erythema, edema, or odor.  Musculoskeletal:  Back is nontender to  palpation.  No clubbing, cyanosis, edema.  Neuro:  Alert, oriented, cooperative.  Gait is stable.  No focal findings.        Assessment and Plan:  Orders Placed This Encounter   . [facility]PN EXTERNAL GI PROCEDURES   . Rheumatoid Factor   . Cyclic Citrullinated Peptide Antibodies, IGG   . SEDIMENTATION RATE   . C-REACTIVE PROTEIN(CRP),INFLAMMATION   . ANA (ANTINUCLEAR ANTIBODIES), SERUM   . CBC/DIFF   . COMPREHENSIVE METABOLIC PNL, FASTING   . COMPREHENSIVE METABOLIC PNL, FASTING   . promethazine (PHENERGAN) 12.5 mg Rectal Suppository   . famotidine (PEPCID) 10 mg Oral Tablet   . leptospermum honey (MEDIHONEY, HONEY,) 80 % Gel     (N94.89) Pelvic hematoma in female  (primary encounter diagnosis)  Plan: COMPREHENSIVE METABOLIC PNL, FASTING,         COMPREHENSIVE METABOLIC PNL, FASTING        Patient advised to proceed with CT scan as scheduled this Thursday and follow-up with gynecology as scheduled.    (R53.83) Fatigue, unspecified type  Plan: Rheumatoid Factor, Cyclic Citrullinated Peptide        Antibodies, IGG, SEDIMENTATION RATE, C-REACTIVE        PROTEIN(CRP),INFLAMMATION, ANA (ANTINUCLEAR         ANTIBODIES), SERUM, CBC/DIFF, COMPREHENSIVE         METABOLIC PNL, FASTING        Patient advised to have blood work completed as ordered.    (M79.10) Myalgia  Plan: Rheumatoid Factor, Cyclic Citrullinated Peptide        Antibodies, IGG, SEDIMENTATION RATE, C-REACTIVE        PROTEIN(CRP),INFLAMMATION, ANA (ANTINUCLEAR         ANTIBODIES), SERUM, CBC/DIFF, COMPREHENSIVE         METABOLIC PNL, FASTING        Patient advised to have blood work completed as ordered.    (M25.50) Arthralgia, unspecified joint  Plan: Rheumatoid Factor, Cyclic Citrullinated Peptide        Antibodies, IGG, SEDIMENTATION RATE, C-REACTIVE        PROTEIN(CRP),INFLAMMATION, ANA (ANTINUCLEAR         ANTIBODIES), SERUM, CBC/DIFF, COMPREHENSIVE          METABOLIC PNL, FASTING        Patient advised to have blood work completed as ordered.    (R63.4) Weight loss  Plan: [facility]PN EXTERNAL GI PROCEDURES, COMPREHENSIVE         METABOLIC PNL, FASTING        Patient advised to have blood work completed as ordered.  EGD ordered.  Patient was referred to Dr[facility] Kem[facility]ke at [facility].    (R11.2) Nausea & vomiting  Plan: [facility] EXTERNAL GI PROCEDURES, COMPREHENSIVE         METABOLIC PNL, FASTING        Pepcid sent to the pharmacy for the patient.  Patient was advised to proceed with EGD.  She was given a prescription for Phenergan suppositories today to help with nausea.    [facility](Z09) Hospital discharge follow-up  Plan: CBC/DIFF, [facility]PN EXTERNAL GI PROCEDURES,         COMPREHENSIVE METABOLIC PNL, FASTING,         COMPREHENSIVE METABOLIC PNL, FASTING        Patient advised to follow-up with gynecology as scheduled.  I did reconcile her discharge medication list.  Patient advised to stop taking Reglan since it is not helping.  Phenergan suppositories sent to the pharmacy for the patient.  I also sent Pepcid to  the pharmacy for her.  She has completed her course of antibiotic therapy.  Medihoney was sent to the pharmacy for the wound of the left lower quadrant.  A work excuse was provided that she will need further workup and may need days off.    (Z51.89) Visit for wound check  Plan:  Medihoney sent to the pharmacy for the patient.  Continue with once a week home care.      Follow Up:  Return in 6 weeks or if symptoms worsen or fail to improve.  Patient instructed to call the office with questions or concerns in the interim.    This dictation was transcribed using voice recognition software and may contain minor typographical errors.

## 2018-11-07 ENCOUNTER — Telehealth (INDEPENDENT_AMBULATORY_CARE_PROVIDER_SITE_OTHER): Payer: Self-pay | Admitting: NURSE PRACTITIONER

## 2018-11-07 LAB — CBC/DIFF
HCT: 35.9
HGB: 12
MCH: 26.5
MCHC: 33.5
MCV: 79.1
MPV: 10.5
PLATELET COUNT: 275
RBC: 4.54
RDW: 14.9
WBC: 9.4

## 2018-11-07 LAB — C-REACTIVE PROTEIN (CRP): C-REACTIVE PROTEIN: 6.8

## 2018-11-07 LAB — RHEUMATOID FACTOR: RHEUMATOID FACTOR: <14

## 2018-11-07 LAB — COMPREHENSIVE METABOLIC PNL, FASTING
ALBUMIN: 4.7
ALKALINE PHOSPHATASE: 64
ALT (SGPT): 15
ANION GAP: 9
AST (SGOT): 17
BILIRUBIN, TOTAL: 0.3
BUN: 9
CALCIUM: 9.7
CARBON DIOXIDE: 24
CHLORIDE: 108
CREATININE: 0.8
ESTIMATED GLOMERULAR FILTRATION RATE: >60
GLUCOSE, FASTING: 89
POTASSIUM: 4.2
SODIUM: 141
TOTAL PROTEIN: 8.1

## 2018-11-07 LAB — ANTINUCLEAR ANTIBODIES, DIRECT - LABCORP: ANA DIRECT: NEGATIVE

## 2018-11-07 LAB — CYCLIC CITRULLINATED PEPTIDE ANTIBODIES, IGG, SERUM: ANTI-CCP: <16

## 2018-11-07 LAB — SEDIMENTATION RATE: SEDIMENTATION RATE: 26

## 2018-11-07 NOTE — Telephone Encounter (Signed)
PT WAS GIVEN A REFERRAL TO DR. Waverly ([facility]).  REFERRAL, DEMOGRAPHICS, INSURANCE CARD AND OFFICE NOTES WERE FAXED TO DR. CLARK'S OFFICE.

## 2018-11-08 ENCOUNTER — Other Ambulatory Visit (INDEPENDENT_AMBULATORY_CARE_PROVIDER_SITE_OTHER): Payer: Self-pay

## 2018-11-08 ENCOUNTER — Other Ambulatory Visit (INDEPENDENT_AMBULATORY_CARE_PROVIDER_SITE_OTHER): Payer: Self-pay | Admitting: NURSE PRACTITIONER

## 2018-11-08 DIAGNOSIS — M791 Myalgia, unspecified site: Secondary | ICD-10-CM

## 2018-11-08 DIAGNOSIS — R634 Abnormal weight loss: Secondary | ICD-10-CM

## 2018-11-08 DIAGNOSIS — R112 Nausea with vomiting, unspecified: Secondary | ICD-10-CM

## 2018-11-08 DIAGNOSIS — R5383 Other fatigue: Secondary | ICD-10-CM

## 2018-11-08 DIAGNOSIS — M2550 Pain in unspecified joint: Secondary | ICD-10-CM

## 2018-11-08 DIAGNOSIS — Z09 Encounter for follow-up examination after completed treatment for conditions other than malignant neoplasm: Secondary | ICD-10-CM

## 2018-11-08 DIAGNOSIS — N9489 Other specified conditions associated with female genital organs and menstrual cycle: Secondary | ICD-10-CM

## 2018-11-09 ENCOUNTER — Telehealth (INDEPENDENT_AMBULATORY_CARE_PROVIDER_SITE_OTHER): Payer: Self-pay | Admitting: NURSE PRACTITIONER

## 2018-11-09 DIAGNOSIS — Z20822 Contact with and (suspected) exposure to covid-19: Secondary | ICD-10-CM

## 2018-11-09 DIAGNOSIS — Z20828 Contact with and (suspected) exposure to other viral communicable diseases: Secondary | ICD-10-CM

## 2018-11-09 NOTE — Telephone Encounter (Signed)
Called and spoke with patient, advised testing ordered. Patient says that she will most likely go on Monday to have testing. Script faxed to [facility].

## 2018-11-09 NOTE — Telephone Encounter (Signed)
Call from patient asking for script for COVID 19 testing.     She states that her coworker developed symptoms on 11/05/18, was tested on 11/05/18 and received positive result on 11/08/18.  Patient reports that she was around this coworker multiple days from 10/29/18-11/08/18. Patient was in the office on 11/03/2018, [place] notified.     Also, patient reports that her aunt developed symptoms on 11/06/18 and was tested, she was at her aunts house on 11/07/2018.  Aunt received notification of positive result on 11/08/2018.

## 2018-11-09 NOTE — Telephone Encounter (Signed)
Covid testing ordered, thank you.

## 2018-11-12 LAB — COVID-19 SCREENING - SEND-OUT: SARS CORONAVIRUS RNA: NOT DETECTED

## 2018-11-13 ENCOUNTER — Other Ambulatory Visit (INDEPENDENT_AMBULATORY_CARE_PROVIDER_SITE_OTHER): Payer: Self-pay

## 2018-11-13 ENCOUNTER — Encounter (INDEPENDENT_AMBULATORY_CARE_PROVIDER_SITE_OTHER): Payer: Self-pay | Admitting: NURSE PRACTITIONER

## 2018-11-13 DIAGNOSIS — R5383 Other fatigue: Secondary | ICD-10-CM

## 2018-11-13 DIAGNOSIS — M2550 Pain in unspecified joint: Secondary | ICD-10-CM

## 2018-11-13 DIAGNOSIS — M791 Myalgia, unspecified site: Secondary | ICD-10-CM

## 2018-11-14 ENCOUNTER — Ambulatory Visit (INDEPENDENT_AMBULATORY_CARE_PROVIDER_SITE_OTHER): Payer: Self-pay | Admitting: Internal Medicine

## 2018-11-14 ENCOUNTER — Encounter (INDEPENDENT_AMBULATORY_CARE_PROVIDER_SITE_OTHER): Payer: Self-pay | Admitting: Internal Medicine

## 2018-11-16 ENCOUNTER — Other Ambulatory Visit (INDEPENDENT_AMBULATORY_CARE_PROVIDER_SITE_OTHER): Payer: Self-pay

## 2018-11-16 DIAGNOSIS — Z20822 Contact with and (suspected) exposure to covid-19: Secondary | ICD-10-CM

## 2018-11-16 DIAGNOSIS — Z20828 Contact with and (suspected) exposure to other viral communicable diseases: Secondary | ICD-10-CM

## 2018-11-20 ENCOUNTER — Ambulatory Visit (INDEPENDENT_AMBULATORY_CARE_PROVIDER_SITE_OTHER): Payer: Self-pay | Admitting: Internal Medicine

## 2018-12-03 ENCOUNTER — Telehealth (INDEPENDENT_AMBULATORY_CARE_PROVIDER_SITE_OTHER): Payer: Self-pay | Admitting: NURSE PRACTITIONER

## 2018-12-03 DIAGNOSIS — Z20828 Contact with and (suspected) exposure to other viral communicable diseases: Secondary | ICD-10-CM

## 2018-12-03 DIAGNOSIS — R519 Headache, unspecified: Secondary | ICD-10-CM

## 2018-12-03 DIAGNOSIS — Z20822 Contact with and (suspected) exposure to covid-19: Secondary | ICD-10-CM

## 2018-12-03 DIAGNOSIS — H9209 Otalgia, unspecified ear: Secondary | ICD-10-CM

## 2018-12-03 NOTE — Telephone Encounter (Signed)
COVID-19 testing ordered.  Patient advised to quarantine per CDC guidelines.  May provide work excuse if needed.

## 2018-12-03 NOTE — Telephone Encounter (Signed)
Call from patient stating that she was around her cousins on Saturday and they received results today that they are positive for COVID 19.  Patient states that when she woke up this morning she had an ear ache and a headache.  Patient asking for script to be tested for COVID.

## 2018-12-03 NOTE — Telephone Encounter (Signed)
Attempted to contact patient- no answer and unable to leave message r/t VM full    Script faxed to [facility] test site

## 2018-12-17 ENCOUNTER — Encounter (INDEPENDENT_AMBULATORY_CARE_PROVIDER_SITE_OTHER): Payer: Self-pay | Admitting: NURSE PRACTITIONER

## 2018-12-26 ENCOUNTER — Telehealth (INDEPENDENT_AMBULATORY_CARE_PROVIDER_SITE_OTHER): Payer: Self-pay | Admitting: NURSE PRACTITIONER

## 2018-12-26 NOTE — Telephone Encounter (Signed)
Call from patient stating that she is having pain and burning with urination and believes she has a UTI.  Patient asking for script to [facility].  Advised patient that there are new rules that she may need office visit.  However, per 12/03/18 telephone encounter the patient stated that she had exposure to [place].  Asked patient if she had covid testing completed.  She says that she did not because she did not know anyone called her back.  Patient says that family she was exposed to now tested negative, but patient never had testing herself.  Patient denies symptoms at this time.  Let her know would send message to provider before scheduling.

## 2018-12-27 ENCOUNTER — Encounter (INDEPENDENT_AMBULATORY_CARE_PROVIDER_SITE_OTHER): Payer: Self-pay | Admitting: Family Medicine

## 2018-12-27 ENCOUNTER — Other Ambulatory Visit: Payer: Self-pay

## 2018-12-27 ENCOUNTER — Encounter (INDEPENDENT_AMBULATORY_CARE_PROVIDER_SITE_OTHER): Payer: Self-pay | Admitting: NURSE PRACTITIONER

## 2018-12-27 ENCOUNTER — Ambulatory Visit (INDEPENDENT_AMBULATORY_CARE_PROVIDER_SITE_OTHER): Payer: BC Managed Care – PPO | Admitting: NURSE PRACTITIONER

## 2018-12-27 VITALS — BP 114/86 | HR 89 | Temp 96.8°F | Ht 63.0 in | Wt 181.0 lb

## 2018-12-27 DIAGNOSIS — R112 Nausea with vomiting, unspecified: Secondary | ICD-10-CM

## 2018-12-27 DIAGNOSIS — R300 Dysuria: Secondary | ICD-10-CM

## 2018-12-27 DIAGNOSIS — N926 Irregular menstruation, unspecified: Secondary | ICD-10-CM

## 2018-12-27 DIAGNOSIS — R102 Pelvic and perineal pain: Secondary | ICD-10-CM

## 2018-12-27 LAB — POCT URINE DIPSTICK
BLOOD: NEGATIVE
LEUKOCYTES: NEGATIVE
NITRITE: NEGATIVE
PH: 7.5
PROTEIN: NEGATIVE
SPECIFIC GRAVITY: 1.005

## 2018-12-27 LAB — POCT URINE HCG: PREGNANCY, URINE: NEGATIVE

## 2018-12-27 MED ORDER — PHENAZOPYRIDINE 100 MG TABLET
100.00 mg | ORAL_TABLET | Freq: Three times a day (TID) | ORAL | 0 refills | Status: DC | PRN
Start: 2018-12-27 — End: 2019-07-05

## 2018-12-27 MED ORDER — NITROFURANTOIN MONOHYDRATE/MACROCRYSTALS 100 MG CAPSULE
100.00 mg | ORAL_CAPSULE | Freq: Two times a day (BID) | ORAL | 0 refills | Status: AC
Start: 2018-12-27 — End: 2019-01-03

## 2018-12-27 NOTE — Telephone Encounter (Signed)
She may come in for office visit if able.  Thank you.

## 2018-12-27 NOTE — Nursing Note (Signed)
12/27/18 1500   Urine test  (S[employer] Multistix 10 SG)   Time collected 1549   Color Yellow   Clarity Clear   Glucose Negative   Bilirubin Negative   Ketones (!) Moderate (40 mg/dl)   Urine Specific Gravity 1.005   Blood (urine) Negative   pH 7.5   Protein Negative   Urobilinogen Normal    Nitrite Negative   Leukocytes Negative   Lot # [id number]   Expiration Date 02/23/20   Initials as

## 2018-12-27 NOTE — Nursing Note (Signed)
12/27/18 1500   HGB   Time Performed 1552   HGB g/dl neg   LOT # [id number]   Expiration Date 02/23/20

## 2018-12-27 NOTE — Nursing Note (Signed)
12/27/18 1500   Urine test  (Siemens Multistix 10 SG)   Time collected 1549   Color Yellow   Clarity Clear   Glucose Negative   Bilirubin Negative   Ketones (!) Moderate (40 mg/dl)   Urine Specific Gravity 1.005   Blood (urine) Negative   pH 7.5   Protein Negative   Urobilinogen Normal    Nitrite Negative   Leukocytes Negative   Lot # [id number]   Expiration Date 05/23/20   Initials as

## 2018-12-27 NOTE — Nursing Note (Signed)
Pt in office for UTI symptoms.

## 2018-12-27 NOTE — Telephone Encounter (Signed)
LM for pt to call back to schedule appt.

## 2018-12-28 LAB — URINE CULTURE

## 2018-12-28 LAB — NEISSERIA GONORRHOEAE DNA BY PCR: NEISSERIA GONORRHOEAE: NOT DETECTED

## 2019-01-01 ENCOUNTER — Other Ambulatory Visit (INDEPENDENT_AMBULATORY_CARE_PROVIDER_SITE_OTHER): Payer: Self-pay

## 2019-01-01 ENCOUNTER — Telehealth (INDEPENDENT_AMBULATORY_CARE_PROVIDER_SITE_OTHER): Payer: Self-pay | Admitting: NURSE PRACTITIONER

## 2019-01-01 DIAGNOSIS — R300 Dysuria: Secondary | ICD-10-CM

## 2019-01-01 DIAGNOSIS — R102 Pelvic and perineal pain: Secondary | ICD-10-CM

## 2019-01-01 NOTE — Telephone Encounter (Signed)
Please have patient have ultrasound as ordered.  Thank you.

## 2019-01-01 NOTE — Telephone Encounter (Signed)
Called and spoke with patient, advised of results states understanding.  Patient asking what else could be going on as she is still having pain and burning with urination?

## 2019-01-01 NOTE — Telephone Encounter (Signed)
Please let patient know her urine culture was negative.  She may stop taking Macrobid that was prescribed.

## 2019-01-01 NOTE — Progress Notes (Signed)
General Exam    Subjective:    Traci [name] is an 18 y.o. female who is here for an acute visit.  The patient tells me she has been having burning with urination and some low back pain for the past 1 week.  She reports her period has been irregular in her last menstrual period was in July.  She says it is burning a great detail every time she urinates especially toward the end of her stream.  She has been following regularly with gynecology.  She did have a laparoscopic endometrial procedure done and had some complications afterwards.  She tells me she has an appointment with gynecology in mid October.  She denies vaginal discharge or odor.  There has been no fevers or chills.    Allergies   Allergen Reactions   . Sulfamethoxazole-Trimethoprim Hives/ Urticaria       Current Outpatient Medications:   .  escitalopram oxalate (LEXAPRO) 20 mg Oral Tablet, Take 1 Tab (20 mg total) by mouth Once a day, Disp: 90 Tab, Rfl: 1  .  famotidine (PEPCID) 10 mg Oral Tablet, Take 1 Tab (10 mg total) by mouth Twice daily (Patient not taking: Reported on 12/27/2018), Disp: 60 Tab, Rfl: 0  .  hydrOXYzine pamoate (VISTARIL) 25 mg Oral Capsule, Take 25 mg by mouth Three times a day as needed for Itching, Disp: , Rfl:   .  Ibuprofen (MOTRIN) 800 mg Oral Tablet, TAKE ONE TABLET BY MOUTH EVERY 8 HOURS BEGINNING 2 DAYS PRIOR TO SURGERY AND CONTINUE FOLLOWING SURGERY AS NEEDED FOR PAIN CONTROL., Disp: , Rfl:   .  Lactobac no.41/Bifidobact no.7 (PROBIOTIC-10 ORAL), Take by mouth, Disp: , Rfl:   .  nitrofurantoin (MACROBID) 100 mg Oral Capsule, Take 1 Cap (100 mg total) by mouth Twice daily for 7 days, Disp: 14 Cap, Rfl: 0  .  Norethindrone, Contraceptive, (equiv to: ORTHO MICRONOR) 0.35 mg Oral Tablet, Take 0.35 mg by mouth Once a day, Disp: , Rfl:   .  ondansetron (ZOFRAN) 4 mg Oral Tablet, Take 4 mg by mouth Every 8 hours as needed for Nausea/Vomiting, Disp: , Rfl:   .  phenazopyridine (PYRIDIUM) 100 mg Oral Tablet, Take 1 Tab (100 mg total)  by mouth Three times a day as needed for Pain, Disp: 30 Tab, Rfl: 0  .  promethazine (PHENERGAN) 12.5 mg Rectal Suppository, 1 Suppository (12.5 mg total) by Rectal route Every 6 hours as needed for Nausea/Vomiting (Patient not taking: Reported on 12/27/2018), Disp: 14 Suppository, Rfl: 0     Past Medical History:   Diagnosis Date   . Anxiety state    . Endometriosis    . Foot fracture     Right   . Gastroesophageal reflux disease 02/19/2014   . HA (headache)    . Hashimoto's thyroiditis    . Hemorrhage of right ovary 02/19/2015   . Hypothyroidism    . Obesity    . Vitamin D deficiency      Past Surgical History:   Procedure Laterality Date   . FOOT SURGERY Right    . GALLBLADDER SURGERY      Cholecystectomy   . HX OTHER  10/30/2015    DIAG LAPARO SEPARATE PROC - fulgration of endometriotic implants   . HX OTHER      ORIF Right foot   . HX TONSIL AND ADENOIDECTOMY     . LAPAROSCOPIC ENDOMETRIOSIS FULGURATION     . LAPAROSCOPIC ENDOMETRIOSIS FULGURATION      2nd  surgery was to also drain a blood clot.     Social History     Socioeconomic History   . Marital status: Single     Spouse name: Not on file   . Number of children: Not on file   . Years of education: Not on file   . Highest education level: Not on file   Tobacco Use   . Smoking status: Never Smoker   . Smokeless tobacco: Never Used   Substance and Sexual Activity   . Alcohol use: Never     Frequency: Never   . Drug use: Never   . Sexual activity: Yes     Birth control/protection: Condom       ROS:  General:  Denies significant weight change, fevers, chills, fatigue.  Eyes:  Denies visual changes.  HENT:  Denies nasal congestion, hearing loss, ear pain, sore throat.  Neck:  Denies neck fullness, pain.  Heart:  Denies chest pain, palpitations.  Lungs:  Denies SOB, pleuritic pain, cough.  Gastrointestinal: Denies any abd pain, N/V/D, constipation.  Genitourinary: + burning with urination.  Musculoskeletal: + low back pain.  Neurologic: Denies headaches, numbness,  tingling.  Integumentary : Denies any rashes, lesions, wounds.    Otherwise all other review of systems are negative    Objective:    Physical Exam:  Vitals:    12/27/18 1534   BP: 114/86   Pulse: 89   Temp: 36 C (96.8 F)   TempSrc: Thermal Scan   SpO2: 98%   Weight: 82.1 kg (181 lb)   Height: 1.6 m (5\' 3" )   BMI: 32.13     Facility age limit for growth percentiles is 20 years.    General:  Well-appearing in no acute distress.  Skin:  Warm, dry, normal.  Without rash, lesion.  Eyes:  Pupils are reactive to light.  EOM intact.  Conjunctiva pink.  No scleral icterus.  HENT:  Gross hearing intact.  Ear canals without cerumen impaction.  Tympanic membranes without erythema, effusion.  No pharyngeal lesions, exudate.  Tongue midline.  Neck:  Supple.  No JVD, thyromegaly, lymphadenopathy.  No carotid bruits.  Heart:  RRR.  No murmur, heave, lift, thrill.  Heart sounds are normal.  No S3, S4 gallop.  Lungs:  Lungs are clear to auscultation bilaterally.  No wheezes, crackles, rubs.  Abdomen:  Mildly tender in the suprapubic area.  She does report CVA tenderness to palpation.  No hepatomegaly, masses, bruits.  Bowel sounds are normal.  Musculoskeletal:  Back is nontender to palpation.  No clubbing, cyanosis, edema.  Neuro:  Alert, oriented, cooperative.  Gait is stable.  No focal findings.    Assessment and Plan:  Orders Placed This Encounter   . Urine Culture   . US TRANSVAGINAL NON OB        . Urinalysis, Macroscopic and Microscopic w/Culture Reflex   . NEISSERIA GONORRHOEAE DNA BY PCR   . CHLAMYDIA / NEISSERIA DNA BY PCR   . OUTSIDE CONSULT/REFERRAL PROVIDER(AMB)   . POCT Urine Dipstick   . POCT Urine HCG   . phenazopyridine (PYRIDIUM) 100 mg Oral Tablet   . nitrofurantoin (MACROBID) 100 mg Oral Capsule     (R30.0) Dysuria  (primary encounter diagnosis)  Plan: POCT Urine Dipstick, Urine Culture, Urinalysis,        Macroscopic and Microscopic w/Culture Reflex,         US TRANSVAGINAL NON OB  Urine dipstick in  the office today was positive for ketones been negative for nitrites and leukocytes.  Urine pregnancy test in the office today was negative.  Will send urine off for further evaluation at the lab for culture.  Will also include gonorrhea and chlamydia testing from the arm.  Based on patient's symptoms will send a prescription for Macrobid to the pharmacy.  Due to patient's recent gynecological complications will order a transvaginal ultrasound.  She did have some tenderness the suprapubic area upon palpation.    (N92.6) Missed period  Plan: POCT Urine HCG, US TRANSVAGINAL NON OB            Same as above    (R10.2) Pelvic pain  Plan: NEISSERIA GONORRHOEAE DNA BY PCR, CHLAMYDIA /         NEISSERIA DNA BY PCR, US TRANSVAGINAL NON OB           Set as above    (R11.2) Nausea and vomiting, intractability of vomiting not specified, unspecified vomiting type  Plan: OUTSIDE CONSULT/REFERRAL PROVIDER(AMB)        Says above      Follow Up:  Return as needed or if symptoms worsen or fail to improve.  Patient advised to follow-up with gynecology as scheduled in October.  Patient instructed to call the office with questions or concerns in the interim.    This dictation was transcribed using voice recognition software and may contain minor typographical errors.

## 2019-01-02 NOTE — Telephone Encounter (Signed)
Called patient to make aware.  No answer, LMOM for patient to call office back.

## 2019-01-03 MED ORDER — ONDANSETRON HCL 4 MG TABLET
4.0000 mg | ORAL_TABLET | Freq: Three times a day (TID) | ORAL | 0 refills | Status: DC | PRN
Start: 2019-01-03 — End: 2020-01-21

## 2019-01-03 NOTE — Telephone Encounter (Signed)
Attempted to contact patient.No answer. Left message for patient to return call.

## 2019-01-03 NOTE — Telephone Encounter (Signed)
Zofran sent to the pharmacy for patient.  Thank you.

## 2019-01-03 NOTE — Telephone Encounter (Signed)
Patient aware and verbalized understanding. 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 Is calling to talk to [facility] about scheduling the [place] today.     States she is not feeling well, has been throwing up and feeling extremely nauseated. This has been present 2 days. Patient states she does NOT have a fever.     Patient states she has been having stomach issues but they just keep getting worse.

## 2019-01-03 NOTE — Telephone Encounter (Signed)
I had placed referral for her to see Dr. Michail Sermon at [facility] twice now.  Has she been able to get an appointment as of yet?  I can send her some Zofran to the pharmacy from her if needed?  If she continues vomiting and unable to hold anything down I would advise she report to the ED.  Thank you.

## 2019-01-03 NOTE — Telephone Encounter (Signed)
Patient aware and verbalized understanding. °

## 2019-01-03 NOTE — Addendum Note (Signed)
Addended byRonaldo Miyamoto B on: 01/03/2019 02:14 PM     Modules accepted: Orders

## 2019-01-03 NOTE — Telephone Encounter (Signed)
Call back from patient, advised of message from provider.  Says that she has been calling to make an appointment, but she has not been able to reach anyone.  She says that she is going to call down there again today.  Also, patient says that she would like Zofran.

## 2019-01-04 ENCOUNTER — Telehealth (INDEPENDENT_AMBULATORY_CARE_PROVIDER_SITE_OTHER): Payer: Self-pay | Admitting: Family Medicine

## 2019-01-04 DIAGNOSIS — Z111 Encounter for screening for respiratory tuberculosis: Secondary | ICD-10-CM

## 2019-01-04 NOTE — Telephone Encounter (Signed)
Okay, reschedule as nurse visit.  Thank you

## 2019-01-04 NOTE — Telephone Encounter (Signed)
I SCHEDULED PT FOR PPD ON NURSES SCHEDULE ON SEPT 15, 2020 AT 1:45 PM- PT AWARE

## 2019-01-04 NOTE — Telephone Encounter (Signed)
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 She needs it completed by Friday of next week. Order pended.     Please call to schedule once ordered.

## 2019-01-04 NOTE — Telephone Encounter (Signed)
Pt needs 1 step PPD.  Can I schedule?

## 2019-01-04 NOTE — Telephone Encounter (Signed)
Pt needs PPD

## 2019-01-07 ENCOUNTER — Ambulatory Visit (INDEPENDENT_AMBULATORY_CARE_PROVIDER_SITE_OTHER): Payer: Self-pay

## 2019-01-08 ENCOUNTER — Ambulatory Visit (INDEPENDENT_AMBULATORY_CARE_PROVIDER_SITE_OTHER): Payer: BC Managed Care – PPO

## 2019-01-08 ENCOUNTER — Other Ambulatory Visit: Payer: Self-pay

## 2019-01-08 VITALS — Temp 97.1°F

## 2019-01-08 DIAGNOSIS — Z111 Encounter for screening for respiratory tuberculosis: Secondary | ICD-10-CM

## 2019-01-09 ENCOUNTER — Encounter (INDEPENDENT_AMBULATORY_CARE_PROVIDER_SITE_OTHER): Payer: Self-pay | Admitting: Family Medicine

## 2019-01-10 ENCOUNTER — Other Ambulatory Visit: Payer: Self-pay

## 2019-01-10 ENCOUNTER — Other Ambulatory Visit (INDEPENDENT_AMBULATORY_CARE_PROVIDER_SITE_OTHER): Payer: Self-pay

## 2019-01-10 ENCOUNTER — Telehealth (INDEPENDENT_AMBULATORY_CARE_PROVIDER_SITE_OTHER): Payer: Self-pay | Admitting: NURSE PRACTITIONER

## 2019-01-10 ENCOUNTER — Ambulatory Visit (INDEPENDENT_AMBULATORY_CARE_PROVIDER_SITE_OTHER): Payer: BC Managed Care – PPO

## 2019-01-10 DIAGNOSIS — Z111 Encounter for screening for respiratory tuberculosis: Secondary | ICD-10-CM

## 2019-01-10 LAB — POCT PPD READING

## 2019-01-10 NOTE — Telephone Encounter (Signed)
Patient was seen today via nurse visit for PPD read.     Patient asking for letter from [employer] stating she has stomach issues and needs to be excused from class at times due to illness. Patient states she is on academic probation. Advised patient that we can only provide her with an excuse for dates that she is seen in the office as we have referred her to see the specialist multiple times.     Patient verbalized understanding and states that she will call us for an appt when she is not well.

## 2019-01-10 NOTE — Telephone Encounter (Signed)
I agree with that recommendation as discussed.  Thank you.

## 2019-01-14 ENCOUNTER — Encounter (INDEPENDENT_AMBULATORY_CARE_PROVIDER_SITE_OTHER): Payer: Self-pay | Admitting: Family Medicine

## 2019-01-15 ENCOUNTER — Encounter (INDEPENDENT_AMBULATORY_CARE_PROVIDER_SITE_OTHER): Payer: Self-pay | Admitting: Internal Medicine

## 2019-01-21 ENCOUNTER — Encounter (INDEPENDENT_AMBULATORY_CARE_PROVIDER_SITE_OTHER): Payer: Self-pay | Admitting: Internal Medicine

## 2019-01-23 ENCOUNTER — Telehealth (INDEPENDENT_AMBULATORY_CARE_PROVIDER_SITE_OTHER): Payer: Self-pay | Admitting: NURSE PRACTITIONER

## 2019-01-23 NOTE — Telephone Encounter (Signed)
Vm from patient stating that she needs PPD sent "somewhere".  Called patient back, no answer, LMOM for patient to call office back.

## 2019-02-18 ENCOUNTER — Encounter (INDEPENDENT_AMBULATORY_CARE_PROVIDER_SITE_OTHER): Payer: Self-pay | Admitting: NURSE PRACTITIONER

## 2019-02-18 ENCOUNTER — Ambulatory Visit (INDEPENDENT_AMBULATORY_CARE_PROVIDER_SITE_OTHER): Payer: BC Managed Care – PPO | Admitting: NURSE PRACTITIONER

## 2019-02-18 ENCOUNTER — Other Ambulatory Visit: Payer: Self-pay

## 2019-02-18 VITALS — BP 122/80 | HR 88 | Temp 97.5°F | Resp 18 | Ht 63.0 in | Wt 194.0 lb

## 2019-02-18 DIAGNOSIS — R109 Unspecified abdominal pain: Secondary | ICD-10-CM

## 2019-02-18 MED ORDER — GI COCKTAIL (ANTACID SUSP, LIDOCAINE)
15.0000 mL | Freq: Every day | 1 refills | Status: DC
Start: 2019-02-18 — End: 2019-07-05

## 2019-02-18 NOTE — Progress Notes (Signed)
[facility]  [address] [place] [address]  Phone: [phone]  Fax: [phone]    Encounter Date: 02/18/2019    Patient ID:  Traci [name]  VQQ:[id number]    DOB: October 25, 2000  Age: 18 y.o. female    Subjective:     Chief Complaint   Patient presents with   . Abdominal Pain     The patient is here today for an acute visit.  She is complaining of mid abdominal discomfort.  She says she is nauseated which she has taken Zofran for and does help however, she says the pain has been bad the last few days.  She did recently have an EGD with Gastroenterology at [facility].  She has not yet had her follow-up appointment which will be until November 4th.  She was given a prescription for Protonix to take after her EGD.  She tells me she did take it for a week however, she felt as though it was making her abdominal discomfort worse.  She describes the discomfort as a burning in her stomach.  She has not had any vomiting, diarrhea, or blood in stool.        Current Outpatient Medications   Medication Sig   . escitalopram oxalate (LEXAPRO) 20 mg Oral Tablet Take 1 Tab (20 mg total) by mouth Once a day   . famotidine (PEPCID) 10 mg Oral Tablet Take 1 Tab (10 mg total) by mouth Twice daily (Patient not taking: Reported on 12/27/2018)   . hydrOXYzine HCL (ATARAX) 10 mg Oral Tablet Take 20 mg by mouth   . hydrOXYzine pamoate (VISTARIL) 25 mg Oral Capsule Take 25 mg by mouth Three times a day as needed for Itching   . Ibuprofen (MOTRIN) 800 mg Oral Tablet TAKE ONE TABLET BY MOUTH EVERY 8 HOURS BEGINNING 2 DAYS PRIOR TO SURGERY AND CONTINUE FOLLOWING SURGERY AS NEEDED FOR PAIN CONTROL.   Francoise Schaumann no.41/Bifidobact no.7 (PROBIOTIC-10 ORAL) Take by mouth   . Norethindrone, Contraceptive, (equiv to: ORTHO MICRONOR) 0.35 mg Oral Tablet Take 0.35 mg by mouth Once a day   . ondansetron (ZOFRAN ODT) 8 mg Oral Tablet, Rapid Dissolve Take 8 mg by mouth Every 8 hours   . ondansetron (ZOFRAN) 4 mg  Oral Tablet Take 1 Tab (4 mg total) by mouth Every 8 hours as needed for Nausea/Vomiting   . pantoprazole (PROTONIX) 40 mg Oral Tablet, Delayed Release (E.C.) TAKE ONE TABLET BY MOUTH EVERY DAY   . phenazopyridine (PYRIDIUM) 100 mg Oral Tablet Take 1 Tab (100 mg total) by mouth Three times a day as needed for Pain (Patient not taking: Reported on 02/18/2019)   . promethazine (PHENERGAN) 12.5 mg Rectal Suppository 1 Suppository (12.5 mg total) by Rectal route Every 6 hours as needed for Nausea/Vomiting (Patient not taking: Reported on 12/27/2018)     Allergies   Allergen Reactions   . Sulfamethoxazole-Trimethoprim Hives/ Urticaria     Past Medical History:   Diagnosis Date   . Anxiety state    . Endometriosis    . Foot fracture     Right   . Gastroesophageal reflux disease 02/19/2014   . HA (headache)    . Hashimoto's thyroiditis    . Hemorrhage of right ovary 02/19/2015   . Hypothyroidism    . Obesity    . Vitamin D deficiency          Past Surgical History:   Procedure Laterality Date   . FOOT SURGERY  Right    . GALLBLADDER SURGERY      Cholecystectomy   . HX OTHER  10/30/2015    DIAG LAPARO SEPARATE PROC - fulgration of endometriotic implants   . HX OTHER      ORIF Right foot   . HX TONSIL AND ADENOIDECTOMY     . LAPAROSCOPIC ENDOMETRIOSIS FULGURATION     . LAPAROSCOPIC ENDOMETRIOSIS FULGURATION      2nd surgery was to also drain a blood clot.         Family Medical History:     Problem Relation (Age of Onset)    No Known Problems Mother, Father            Social History     Tobacco Use   . Smoking status: Never Smoker   . Smokeless tobacco: Never Used   Substance Use Topics   . Alcohol use: Never     Frequency: Never   . Drug use: Never       Review of Systems   Constitutional: Negative.    HENT: Negative.    Eyes: Negative.    Respiratory: Negative.    Cardiovascular: Negative.    Gastrointestinal: Positive for abdominal pain and nausea.   Endocrine: Negative.    Genitourinary: Negative.    Musculoskeletal:  Negative.    Skin: Negative.    Allergic/Immunologic: Negative.    Neurological: Negative.    Hematological: Negative.    Psychiatric/Behavioral: Negative.      Objective:   Vitals: BP 120/90   Pulse 88   Temp 36.4 C (97.5 F)   Resp 18   Ht 1.6 m (5\' 3" )   Wt 88 kg (194 lb)   SpO2 99%   BMI 34.37 kg/m   Facility age limit for growth percentiles is 20 years.      Physical Exam  Constitutional:       Appearance: Normal appearance.   HENT:      Head: Normocephalic and atraumatic.      Right Ear: Tympanic membrane normal.      Left Ear: Tympanic membrane normal.      Nose: Nose normal.      Mouth/Throat:      Mouth: Mucous membranes are moist.   Eyes:      Extraocular Movements: Extraocular movements intact.      Pupils: Pupils are equal, round, and reactive to light.   Cardiovascular:      Rate and Rhythm: Normal rate and regular rhythm.      Pulses: Normal pulses.      Heart sounds: Normal heart sounds.   Pulmonary:      Effort: Pulmonary effort is normal.      Breath sounds: Normal breath sounds.   Abdominal:      General: There is no distension.      Palpations: Abdomen is soft.      Tenderness: There is abdominal tenderness. There is no guarding or rebound.      Hernia: No hernia is present.   Musculoskeletal: Normal range of motion.   Skin:     General: Skin is warm and dry.      Capillary Refill: Capillary refill takes less than 2 seconds.   Neurological:      General: No focal deficit present.      Mental Status: She is alert and oriented to person, place, and time.   Psychiatric:         Mood and Affect: Mood normal.  Behavior: Behavior normal.         Assessment & Plan:       ICD-10-CM    1. Abdominal pain, unspecified abdominal location  R10.9      Orders Placed This Encounter   . GI cocktail (ANTACID SUSP, LIDOCAINE) oral solution     Patient was advised to follow-up with gastroenterology as scheduled.  I did send GI cocktail to the pharmacy for her today, 2 doses.  She was advised to eat a  brat diet and advanced as tolerated.  Discussed the symptoms she is experiencing is probable gastritis.  Advised to call our office if no improvement of symptoms.  A work excuse was given for yesterday and today.    [employer], CRNP

## 2019-02-18 NOTE — Nursing Note (Signed)
Pt is here for abdominal pain X 4 days

## 2019-02-20 ENCOUNTER — Telehealth (INDEPENDENT_AMBULATORY_CARE_PROVIDER_SITE_OTHER): Payer: Self-pay | Admitting: Family

## 2019-02-20 NOTE — Telephone Encounter (Signed)
Would recommend she report to the ER for evaluation or schedule an appointment

## 2019-02-20 NOTE — Telephone Encounter (Signed)
Pt called in she seen Jen on the 26th of October and was given the GI cocktail. Pt called in today stating she has taken 2 doses and has no relief of the abdominal pain. Pt was wondering what she should do.

## 2019-02-21 NOTE — Telephone Encounter (Signed)
02/21/19 pt notified she will go to the er.

## 2019-04-15 ENCOUNTER — Encounter (INDEPENDENT_AMBULATORY_CARE_PROVIDER_SITE_OTHER): Payer: Self-pay | Admitting: Internal Medicine

## 2019-05-20 ENCOUNTER — Ambulatory Visit (INDEPENDENT_AMBULATORY_CARE_PROVIDER_SITE_OTHER): Payer: Self-pay | Admitting: NURSE PRACTITIONER

## 2019-05-20 DIAGNOSIS — Z20822 Contact with and (suspected) exposure to covid-19: Secondary | ICD-10-CM

## 2019-05-20 NOTE — Telephone Encounter (Signed)
Regarding: [place]/ Covid test  ----- Message from Marden Noble sent at 05/20/2019  8:43 AM EST -----  Peter Minium, MD    Pt exposed to Covid would like order for test faxed to [facility]   [phone]    Thanks  Marden Noble

## 2019-05-20 NOTE — Telephone Encounter (Signed)
Faxed script to registration as requested.

## 2019-05-20 NOTE — Telephone Encounter (Signed)
COVID screening ordered for patient.  Please fax order to the testing center.  Thank you.

## 2019-05-20 NOTE — Telephone Encounter (Signed)
Patient is requesting Covid testing, rapid if possible, at [facility]. Patient was exposed to coworker who tested positive and needs a negative test to be able to return to work. Patient herself is asypmtomatic.

## 2019-05-21 LAB — COVID-19 SCREENING - SEND-OUT: SARS-COV-2 RNA: NOT DETECTED

## 2019-05-24 ENCOUNTER — Other Ambulatory Visit (INDEPENDENT_AMBULATORY_CARE_PROVIDER_SITE_OTHER): Payer: Self-pay

## 2019-05-24 ENCOUNTER — Telehealth (INDEPENDENT_AMBULATORY_CARE_PROVIDER_SITE_OTHER): Payer: Self-pay | Admitting: NURSE PRACTITIONER

## 2019-05-24 DIAGNOSIS — Z20822 Contact with and (suspected) exposure to covid-19: Secondary | ICD-10-CM

## 2019-05-24 NOTE — Telephone Encounter (Signed)
Please let patient know her code testing was negative.  Thank you.

## 2019-05-24 NOTE — Telephone Encounter (Signed)
Left vm waiting on call back

## 2019-05-24 NOTE — Telephone Encounter (Signed)
Patient aware. Now states she has fever, nausea, and diarrhea. Appointment made for Monday am

## 2019-05-27 ENCOUNTER — Encounter (INDEPENDENT_AMBULATORY_CARE_PROVIDER_SITE_OTHER): Payer: Self-pay | Admitting: Physician Assistant

## 2019-06-06 ENCOUNTER — Other Ambulatory Visit: Payer: Self-pay

## 2019-06-06 ENCOUNTER — Telehealth (INDEPENDENT_AMBULATORY_CARE_PROVIDER_SITE_OTHER): Payer: Self-pay | Admitting: NURSE PRACTITIONER

## 2019-06-06 ENCOUNTER — Telehealth: Payer: Self-pay | Admitting: NURSE PRACTITIONER

## 2019-06-06 DIAGNOSIS — R102 Pelvic and perineal pain: Secondary | ICD-10-CM

## 2019-06-06 DIAGNOSIS — R509 Fever, unspecified: Secondary | ICD-10-CM

## 2019-06-06 DIAGNOSIS — R198 Other specified symptoms and signs involving the digestive system and abdomen: Secondary | ICD-10-CM

## 2019-06-06 DIAGNOSIS — R112 Nausea with vomiting, unspecified: Secondary | ICD-10-CM

## 2019-06-06 DIAGNOSIS — R109 Unspecified abdominal pain: Secondary | ICD-10-CM

## 2019-06-06 LAB — COMPREHENSIVE METABOLIC PNL, FASTING
ALBUMIN: 4.2
ALKALINE PHOSPHATASE: 51
ALT (SGPT): 18
ANION GAP: 7
AST (SGOT): 16
BILIRUBIN, TOTAL: 0.3
BUN: 8
CALCIUM: 9.1
CARBON DIOXIDE: 25
CHLORIDE: 107
CREATININE: 0.7
GLUCOSE, FASTING: 77
POTASSIUM: 4.1
SODIUM: 139
TOTAL PROTEIN: 6.9

## 2019-06-06 LAB — CBC
HCT: 37.6
HGB: 12.3
MCH: 25.9
MCHC: 32.7
MCV: 79.3
MPV: 9.4
PLATELET COUNT: 288
RBC: 4.75
RDW: 15.3
WBC: 10.8

## 2019-06-06 NOTE — Telephone Encounter (Signed)
Spoke with Cordelia Pen in scheduling and she does not have to pay any money up front and patient is aware of this. She agreed to have CT done today. Patient aware to report to the hospital now.

## 2019-06-06 NOTE — Telephone Encounter (Signed)
Patient called back and states that she will be applying for financial assistance through the hospital today. It takes 6-8 hours to know whether she is approved. Patient requesting to wait and have STAT testing done tomorrow once she knows that she is approved. She also states that they are requiring her to pay $600 up front and she is not able to afford the cost. Patient will call office back once she hears if she is approved.

## 2019-06-06 NOTE — Telephone Encounter (Signed)
Saw patient as a video visit.  Stat CBC, CMP and stat CT scan of the abdomen and pelvis was ordered for the patient.  She will need to be set up for her CT scan and notified on what time to report.  Thank you.

## 2019-06-06 NOTE — Telephone Encounter (Signed)
Reordered, thank you

## 2019-06-06 NOTE — Telephone Encounter (Signed)
Please clarify if you would like CT Chest Abd/pelvis or just CT Abd/pelvis. Order was done CT Chest Abdomen Pelvis. Please cancel and reorder if needs changed. Back to me to authorize. Thanks

## 2019-06-06 NOTE — Progress Notes (Signed)
PRIMARY CARE, [facility]  [address] [place] [address]    Video Visit     Name: Traci [name]  MRN: [id number]    Date: 06/06/2019  Age: 19 y.o.                            Patient's location: Home [name] [address]   Patient/family aware of provider location: Yes  Patient/family consent for video visit: Yes  Interview and observation performed by: Ilsa Iha, CRNP    Chief Complaint: Fever and Vomiting    History of Present Illness:  Traci [name] is a 19 y.o. female who was placed on the schedule today for a video visit.  The patient tells me she has been having lower abdominal pain, fever, and vomiting for about the past 2 weeks.  She says she has been getting progressively worse over the last 1 week.  She says her abdominal pain is mostly in her lower abdomen but greater on her right lower quadrant.  She says she is tender in the right lower quadrant when she pushes on that area.  She does have a history of endometriosis and did have an exploratory laparotomy this past summer which led to an abscess.  She was following with gynecology for the abscess.  She has had continued abdominal pain and was referred to gastroenterology.  At the moment, she is unable to see her gynecologist and gastroenterologist due to insurance change.  She tells me the pain she is having now is different than previously.  She has become more nauseated.  She has vomited several times over the past 2 weeks.  She says she is also having change in bowel habits.  She will have diarrhea 1 day and then be constipated the next day.  She denies blood in her bowel movements.  She denies urinary frequency, burning with urination, or foul odor to her urine.    Past Medical History:  She has a past medical history of Anxiety state, Endometriosis, Foot fracture, Gastroesophageal reflux disease (02/19/2014), HA (headache), Hashimoto's thyroiditis, Hemorrhage of right ovary (02/19/2015), Hypothyroidism,  Obesity, and Vitamin D deficiency.    Past Surgical History:  She has a past medical history of Anxiety state, Endometriosis, Foot fracture, Gastroesophageal reflux disease (02/19/2014), HA (headache), Hashimoto's thyroiditis, Hemorrhage of right ovary (02/19/2015), Hypothyroidism, Obesity, and Vitamin D deficiency.    Problem List:  She has Anxiety; Endometriosis; Hashimoto's thyroiditis; Iron deficiency; Lisping; Overweight (BMI 25.0-29.9); Pelvic pain; Vitamin D deficiency; Advice given about COVID-19 virus infection; and Postoperative nausea and vomiting on their problem list.    Medications:  .  escitalopram oxalate  .  famotidine  .  gi cocktail  .  hydrOXYzine HCL  .  hydrOXYzine pamoate  .  Ibuprofen  .  Lactobac no.41/Bifidobact no.7 (PROBIOTIC-10 ORAL)  .  Norethindrone (Contraceptive)  .  ondansetron  .  ondansetron  .  pantoprazole  .  phenazopyridine  .  promethazine     Review of Systems:  Review of Systems   Constitutional: Positive for chills, fever and malaise/fatigue.   HENT: Negative.    Eyes: Negative.    Respiratory: Negative.    Cardiovascular: Negative.    Gastrointestinal: Positive for abdominal pain, constipation, diarrhea, nausea and vomiting. Negative for blood in stool and melena.   Genitourinary: Negative.    Musculoskeletal: Negative.    Skin: Negative.    Neurological: Negative.  Psychiatric/Behavioral: Negative.        Observational Exam:   During the observational exam the patient does not appear to be in any acute distress.  She is able to answer questions without hesitancy.  She is able to speak in a full sentence without stopping to take a breath.  She does appear to be splinting her right lower quadrant drain the video exam.  She pushes on her abdomen and says it hurts in her right quadrant.    Assessment/Plan:  Stat CT scan of the abdomen and pelvis ordered for the patient as well as CBC and CMP.  She was advised to have completed as soon as possible.  Concerning for possible  appendicitis.  Patient was advised to go to the emergency department for any worsening of pain.      ICD-10-CM    1. Abdominal pain, unspecified abdominal location  R10.9 COMPREHENSIVE METABOLIC PNL, FASTING     CBC     CT Abdomen Pelvis WO     Orders Placed This Encounter   . CANCELED: CT Chest Abdomen Pelvis WO   . CT Abdomen Pelvis WO   . COMPREHENSIVE METABOLIC PNL, FASTING   . CBC     Follow Up:  patient advised to keep routine follow-up appointment as scheduled.    [employer], CRNP

## 2019-06-06 NOTE — Telephone Encounter (Signed)
Patient states her insurance termed on 05-27-19 and is not reinstated until March. Patient would like to call billing at [facility] to check the cost of testing and will call office back if she would like to proceed with STAT testing.

## 2019-06-07 ENCOUNTER — Other Ambulatory Visit (INDEPENDENT_AMBULATORY_CARE_PROVIDER_SITE_OTHER): Payer: Self-pay

## 2019-06-07 ENCOUNTER — Ambulatory Visit (INDEPENDENT_AMBULATORY_CARE_PROVIDER_SITE_OTHER): Payer: Self-pay | Admitting: Family Medicine

## 2019-06-07 ENCOUNTER — Telehealth (INDEPENDENT_AMBULATORY_CARE_PROVIDER_SITE_OTHER): Payer: Self-pay | Admitting: NURSE PRACTITIONER

## 2019-06-07 DIAGNOSIS — R911 Solitary pulmonary nodule: Secondary | ICD-10-CM

## 2019-06-07 DIAGNOSIS — R109 Unspecified abdominal pain: Secondary | ICD-10-CM

## 2019-06-07 DIAGNOSIS — R509 Fever, unspecified: Secondary | ICD-10-CM

## 2019-06-07 DIAGNOSIS — R102 Pelvic and perineal pain: Secondary | ICD-10-CM

## 2019-06-07 NOTE — Telephone Encounter (Signed)
Discussed in a different message

## 2019-06-07 NOTE — Telephone Encounter (Signed)
Regarding: Dr Peter Minium, MD, Stat CT scan results  ----- Message from [facility] sent at 06/07/2019  4:16 PM EST -----  Peter Minium, MD    Patient called in regards to results of STAT CT scan in chart.     Thank you,  Rinaldo Ratel

## 2019-06-07 NOTE — Telephone Encounter (Signed)
Pt aware. Please schedule

## 2019-06-07 NOTE — Telephone Encounter (Addendum)
Please advise patient that CT scan was negative for appendicitis.  There was a lung nodule noted on CT scan, an order will be placed for pulmonology for evaluation.  CMP was WNL, advise patient to report tot he ED for worsening pain/discomfort over the weekend.  Thank you.

## 2019-06-10 NOTE — Telephone Encounter (Signed)
Faxed paperwork to [facility] and they will call patient to schedule.

## 2019-06-10 NOTE — Telephone Encounter (Signed)
Need auth

## 2019-06-12 ENCOUNTER — Other Ambulatory Visit (INDEPENDENT_AMBULATORY_CARE_PROVIDER_SITE_OTHER): Payer: Self-pay

## 2019-06-12 DIAGNOSIS — R109 Unspecified abdominal pain: Secondary | ICD-10-CM

## 2019-07-04 ENCOUNTER — Inpatient Hospital Stay (INDEPENDENT_AMBULATORY_CARE_PROVIDER_SITE_OTHER): Payer: Medicaid Other | Admitting: NURSE PRACTITIONER

## 2019-07-04 ENCOUNTER — Inpatient Hospital Stay (HOSPITAL_COMMUNITY)
Admission: EM | Admit: 2019-07-04 | Discharge: 2019-07-04 | Disposition: A | Discharge status: Home / Self Care | Payer: Medicaid Other | Source: Other Acute Inpatient Hospital

## 2019-07-04 ENCOUNTER — Other Ambulatory Visit: Payer: Self-pay

## 2019-07-04 DIAGNOSIS — R0602 Shortness of breath: Secondary | ICD-10-CM

## 2019-07-04 DIAGNOSIS — R071 Chest pain on breathing: Secondary | ICD-10-CM

## 2019-07-04 DIAGNOSIS — R05 Cough: Secondary | ICD-10-CM

## 2019-07-05 ENCOUNTER — Encounter (INDEPENDENT_AMBULATORY_CARE_PROVIDER_SITE_OTHER): Payer: Self-pay | Admitting: NURSE PRACTITIONER

## 2019-07-05 ENCOUNTER — Ambulatory Visit (INDEPENDENT_AMBULATORY_CARE_PROVIDER_SITE_OTHER): Payer: Medicaid Other | Admitting: NURSE PRACTITIONER

## 2019-07-05 VITALS — BP 120/80 | HR 87 | Temp 97.1°F | Ht 62.0 in | Wt 195.0 lb

## 2019-07-05 DIAGNOSIS — R0602 Shortness of breath: Secondary | ICD-10-CM

## 2019-07-05 DIAGNOSIS — F419 Anxiety disorder, unspecified: Secondary | ICD-10-CM

## 2019-07-05 DIAGNOSIS — Z6835 Body mass index (BMI) 35.0-35.9, adult: Secondary | ICD-10-CM

## 2019-07-05 DIAGNOSIS — R05 Cough: Secondary | ICD-10-CM

## 2019-07-05 DIAGNOSIS — R059 Cough, unspecified: Secondary | ICD-10-CM

## 2019-07-05 DIAGNOSIS — F329 Major depressive disorder, single episode, unspecified: Secondary | ICD-10-CM

## 2019-07-05 MED ORDER — VENLAFAXINE 37.5 MG TABLET
37.5000 mg | ORAL_TABLET | Freq: Every day | ORAL | 1 refills | Status: DC
Start: 2019-07-05 — End: 2019-08-06

## 2019-07-05 NOTE — Nursing Note (Signed)
Patient is here for an ED follow up from [facility] for shortness of breath

## 2019-07-05 NOTE — Progress Notes (Signed)
PRIMARY CARE, [facility]  [address] [place] [address]  Phone: [phone]  Fax: [phone]    Encounter Date: 07/05/2019    Patient ID:  Traci [name]  IRC:[id number]    DOB: 2000-10-03  Age: 19 y.o. female    Subjective:     Chief Complaint   Patient presents with   . ED Follow-up     SOB     The patient is here for an emergency department follow-up appointment.  She was seen in the emergency department at the [facility] on July 04, 2019 for the chief complaint cough and shortness of breath.  She was noted to have a lung nodule on CT scan on June 06, 2019 and was referred to pulmonology at that time.  While in the emergency department she had a chest x-ray which was negative for any cardiopulmonary process.  EKG showed normal sinus rhythm, D-dimer negative, and white count was within normal limits.  Was discharged home to follow-up with PCP. She has been referred to pul,onology and had a visit last Tuesday.  She was prescribed MDI, Doxycycline, and Prednisone and just picked them up this morning.  She has not started taking her medication as of yet.  She tells me she feels short of breath and like her heart is going to beat out of her chest sometimes.  She has depression and anxiety and has tried Zoloft, Lexapro, and Vistaril in the past without much improvement.  She has stopped taking her Lexapro around October.  She tells currently.  She is tearful and tells me she is having a hard time her job she supposed to be starting school again soon.  She denies suicidal homicidal ideations.  She tells me she is open to seeing psychiatry.  She would like to try a new medication for her depression and anxiety.        Current Outpatient Medications   Medication Sig   . albuterol sulfate (PROVENTIL OR VENTOLIN OR PROAIR) 90 mcg/actuation Inhalation HFA Aerosol Inhaler INHALE TWO PUFFS BY MOUTH EVERY 4 HOURS AS NEEDED FOR WHEEZING OR SHORTNESS OF BREATH.   Marland Kitchen doxycycline hyclate  (VIBRAMYCIN) 100 mg Oral Capsule Take 100 mg by mouth   . drospirenone, contraceptive, 4 mg (28) Oral Tablet Take 4 mg by mouth   . escitalopram oxalate (LEXAPRO) 20 mg Oral Tablet Take 1 Tab (20 mg total) by mouth Once a day (Patient not taking: Reported on 07/05/2019)   . famotidine (PEPCID) 10 mg Oral Tablet Take 1 Tab (10 mg total) by mouth Twice daily (Patient not taking: Reported on 07/05/2019)   . hydrOXYzine HCL (ATARAX) 10 mg Oral Tablet Take 20 mg by mouth (Patient not taking: Reported on 07/05/2019)   . hydrOXYzine pamoate (VISTARIL) 25 mg Oral Capsule Take 25 mg by mouth Three times a day as needed for Itching (Patient not taking: Reported on 07/05/2019)   . Ibuprofen (MOTRIN) 800 mg Oral Tablet TAKE ONE TABLET BY MOUTH EVERY 8 HOURS BEGINNING 2 DAYS PRIOR TO SURGERY AND CONTINUE FOLLOWING SURGERY AS NEEDED FOR PAIN CONTROL. (Patient not taking: Reported on 07/05/2019)   . Lactobac no.41/Bifidobact no.7 (PROBIOTIC-10 ORAL) Take by mouth (Patient not taking: Reported on 07/05/2019)   . Norethindrone, Contraceptive, (equiv to: ORTHO MICRONOR) 0.35 mg Oral Tablet Take 0.35 mg by mouth Once a day (Patient not taking: Reported on 07/05/2019)   . ondansetron (ZOFRAN ODT) 8 mg Oral Tablet, Rapid Dissolve Take 8 mg by mouth  Every 8 hours (Patient not taking: Reported on 07/05/2019)   . ondansetron (ZOFRAN) 4 mg Oral Tablet Take 1 Tab (4 mg total) by mouth Every 8 hours as needed for Nausea/Vomiting   . pantoprazole (PROTONIX) 40 mg Oral Tablet, Delayed Release (E.C.) TAKE ONE TABLET BY MOUTH EVERY DAY (Patient not taking: Reported on 07/05/2019)   . phenazopyridine (PYRIDIUM) 100 mg Oral Tablet Take 1 Tab (100 mg total) by mouth Three times a day as needed for Pain (Patient not taking: Reported on 02/18/2019)   . predniSONE (DELTASONE) 10 mg Oral Tablet Take 20 mg by mouth   . promethazine (PHENERGAN) 12.5 mg Rectal Suppository 1 Suppository (12.5 mg total) by Rectal route Every 6 hours as needed for Nausea/Vomiting  (Patient not taking: Reported on 12/27/2018)     Allergies   Allergen Reactions   . Sulfamethoxazole-Trimethoprim Hives/ Urticaria     Past Medical History:   Diagnosis Date   . Anxiety state    . Endometriosis    . Foot fracture     Right   . Gastroesophageal reflux disease 02/19/2014   . HA (headache)    . Hashimoto's thyroiditis    . Hemorrhage of right ovary 02/19/2015   . Hypothyroidism    . Obesity    . Vitamin D deficiency          Past Surgical History:   Procedure Laterality Date   . FOOT SURGERY Right    . GALLBLADDER SURGERY      Cholecystectomy   . HX OTHER  10/30/2015    DIAG LAPARO SEPARATE PROC - fulgration of endometriotic implants   . HX OTHER      ORIF Right foot   . HX TONSIL AND ADENOIDECTOMY     . LAPAROSCOPIC ENDOMETRIOSIS FULGURATION     . LAPAROSCOPIC ENDOMETRIOSIS FULGURATION      2nd surgery was to also drain a blood clot.         Family Medical History:     Problem Relation (Age of Onset)    No Known Problems Mother, Father            Social History     Tobacco Use   . Smoking status: Never Smoker   . Smokeless tobacco: Never Used   Vaping Use   . Vaping Use: Every day   Substance Use Topics   . Alcohol use: Never   . Drug use: Never       Review of Systems   Constitutional: Negative.    HENT: Negative.    Eyes: Negative.    Respiratory: Positive for cough and shortness of breath.    Cardiovascular: Negative.    Gastrointestinal: Negative.    Endocrine: Negative.    Genitourinary: Negative.    Musculoskeletal: Negative.    Skin: Negative.    Neurological: Negative.    Hematological: Negative.    Psychiatric/Behavioral: Positive for agitation and decreased concentration. Negative for suicidal ideas. The patient is nervous/anxious.      Objective:   Vitals: BP 120/80 (Site: Left, Patient Position: Sitting, Cuff Size: Adult)   Pulse 87   Temp 36.2 C (97.1 F) (Thermal Scan)   Ht 1.575 m (5\' 2" )   Wt 88.5 kg (195 lb)   SpO2 99%   BMI 35.67 kg/m   Facility age limit for growth percentiles  is 20 years.      Physical Exam  Constitutional:       Appearance: Normal appearance.   HENT:  Head: Normocephalic.      Right Ear: Tympanic membrane normal.      Left Ear: Tympanic membrane normal.      Nose: Nose normal.      Mouth/Throat:      Mouth: Mucous membranes are moist.   Eyes:      Extraocular Movements: Extraocular movements intact.      Pupils: Pupils are equal, round, and reactive to light.   Cardiovascular:      Rate and Rhythm: Normal rate and regular rhythm.      Pulses: Normal pulses.      Heart sounds: Normal heart sounds.   Pulmonary:      Effort: Pulmonary effort is normal.      Breath sounds: Normal breath sounds. No stridor or decreased air movement. No decreased breath sounds, wheezing, rhonchi or rales.   Abdominal:      Palpations: Abdomen is soft.      Tenderness: There is no abdominal tenderness.   Musculoskeletal:         General: Normal range of motion.      Cervical back: Normal range of motion and neck supple.   Skin:     General: Skin is warm and dry.      Capillary Refill: Capillary refill takes less than 2 seconds.   Neurological:      General: No focal deficit present.      Mental Status: She is alert and oriented to person, place, and time.   Psychiatric:         Attention and Perception: Attention normal.         Mood and Affect: Mood is anxious. Affect is tearful.         Speech: Speech normal.         Behavior: Behavior normal. Behavior is cooperative.         Thought Content: Thought content normal.         Assessment & Plan:       ICD-10-CM    1. Shortness of breath  R06.02    2. Cough  R05    3. Anxiety and depression  F41.9 OUTSIDE CONSULT/REFERRAL PROVIDER(AMB)    F32.9    4. BMI 35.0-35.9,adult  Z68.35      Orders Placed This Encounter   . OUTSIDE CONSULT/REFERRAL PROVIDER(AMB)   . venlafaxine (EFFEXOR) 37.5 mg Oral Tablet     Patient was advised to start her steroids, MDI inhaler, and doxycycline as prescribed by pulmonology.  She will continue to follow with  pulmonology as scheduled.  I did send Effexor to the pharmacy for the patient today.  She expressed interest in therapy and I placed a consult for Psychiatry.  Patient will follow-up in 1 month to evaluate effectiveness of medication.  I advised her to return to clinic if her symptoms should worsen or fail to improve at all.    [employer], CRNP

## 2019-07-08 ENCOUNTER — Other Ambulatory Visit (INDEPENDENT_AMBULATORY_CARE_PROVIDER_SITE_OTHER): Payer: Self-pay

## 2019-07-12 ENCOUNTER — Ambulatory Visit (INDEPENDENT_AMBULATORY_CARE_PROVIDER_SITE_OTHER): Payer: Self-pay | Admitting: Family Medicine

## 2019-07-12 NOTE — Telephone Encounter (Signed)
Attempted to call patient to schedule appointment. No answer. Unable to leave message on machine

## 2019-07-12 NOTE — Telephone Encounter (Signed)
Regarding: Talaman-Perez Pt // Rx request  ----- Message from [facility] sent at 07/12/2019  8:09 AM EDT -----  Peter Minium, MD    Pt calling in stating she has a UTI and would like something called in. She states that she was recently on an antibiotic.        Preferred Pharmacy     [id number] - [name], [place] - [address]    [address] [place] [address]    Phone: [phone] Fax: [phone]    Hours: Not open 24 hours        Thanks,  [employer]

## 2019-07-15 ENCOUNTER — Encounter (INDEPENDENT_AMBULATORY_CARE_PROVIDER_SITE_OTHER): Payer: Self-pay | Admitting: Physician Assistant

## 2019-07-16 ENCOUNTER — Ambulatory Visit (INDEPENDENT_AMBULATORY_CARE_PROVIDER_SITE_OTHER): Payer: Self-pay | Admitting: NURSE PRACTITIONER

## 2019-07-16 NOTE — Telephone Encounter (Signed)
Yes, that would be okay.  Please call if no better.  Thank you.

## 2019-07-16 NOTE — Telephone Encounter (Signed)
Patient states she has been itchy, currently taking hydroxizine qhs. She is wondering if it is OK to take OTC non-drowsy benadryl during the day. Please advise

## 2019-07-16 NOTE — Telephone Encounter (Signed)
Patient aware.

## 2019-07-16 NOTE — Telephone Encounter (Signed)
Regarding: Talaman-Perez Pt // Call back request  ----- Message from Leanna Sato [place] sent at 07/16/2019  3:41 PM EDT -----  Peter Minium, MD    Pt calling in requesting a call back from the nurses about a "medication follow-up". Please call back and advise.    Thanks,  [employer]

## 2019-07-16 NOTE — Telephone Encounter (Signed)
Tried to call pt vm is full. Unable to leave vm

## 2019-08-02 ENCOUNTER — Encounter (INDEPENDENT_AMBULATORY_CARE_PROVIDER_SITE_OTHER): Payer: Self-pay | Admitting: NURSE PRACTITIONER

## 2019-08-06 ENCOUNTER — Other Ambulatory Visit: Payer: Self-pay

## 2019-08-06 ENCOUNTER — Encounter (INDEPENDENT_AMBULATORY_CARE_PROVIDER_SITE_OTHER): Payer: Self-pay | Admitting: NURSE PRACTITIONER

## 2019-08-06 ENCOUNTER — Ambulatory Visit (INDEPENDENT_AMBULATORY_CARE_PROVIDER_SITE_OTHER): Payer: No Typology Code available for payment source | Admitting: NURSE PRACTITIONER

## 2019-08-06 VITALS — BP 122/84 | HR 85 | Temp 97.1°F | Ht 62.0 in | Wt 193.2 lb

## 2019-08-06 DIAGNOSIS — F419 Anxiety disorder, unspecified: Secondary | ICD-10-CM

## 2019-08-06 DIAGNOSIS — R109 Unspecified abdominal pain: Secondary | ICD-10-CM

## 2019-08-06 DIAGNOSIS — E669 Obesity, unspecified: Secondary | ICD-10-CM

## 2019-08-06 DIAGNOSIS — F410 Panic disorder [episodic paroxysmal anxiety] without agoraphobia: Secondary | ICD-10-CM

## 2019-08-06 MED ORDER — VENLAFAXINE 37.5 MG TABLET
37.50 mg | ORAL_TABLET | Freq: Every day | ORAL | 1 refills | Status: DC
Start: 2019-08-06 — End: 2019-12-24

## 2019-08-06 MED ORDER — HYDROXYZINE PAMOATE 25 MG CAPSULE
25.00 mg | ORAL_CAPSULE | Freq: Three times a day (TID) | ORAL | 1 refills | Status: DC | PRN
Start: 2019-08-06 — End: 2020-04-26

## 2019-08-06 NOTE — Nursing Note (Signed)
Patient here for 4 week follow up for anxiety she states has been getting really bad headaches and has had 2 pass out spells where she lost consiousness and it took about 5 minutes for her to come to no seizure activity per patient , she also states that her chest is still hurting her.

## 2019-08-08 NOTE — Progress Notes (Signed)
General Exam    Subjective:    Traci [name] is an 19 y.o. female who is here for a follow-up appointment after being started on Effexor for anxiety depression.  She was seen on July 05, 2019 and started on Effexor for anxiety and depression.  Previous medications she has tried included Zoloft, Lexapro, BuSpar, and Vistaril.  She has been referred to Psychiatry several times in the past and has not been compliant with scheduling an appointment.  She says she has frequent panic attacks and anxiety.  When she has her panic attacks she feels incapacitated and feels chest pressure and heaviness.  She has shortness of breath during these episodes.  She says these episodes happen frequently.  She also has been having abdominal pain which is chronic she was seeing Dr. Carlis Abbott at [facility] and tells me she is unable to get in contact with their office.  She would like to have a referral for gastroenterologist locally.  She denies depressed thoughts.  She denies suicidal or homicidal ideations.  She does tell me out of all the medication she has tried, the Vistaril has probably helped the most.  She would like to have a refill of this today.  She says she has not noticed a difference at all with the use of Effexor.  She wants to try and wean herself off of the Effexor.    Allergies   Allergen Reactions    Sulfamethoxazole-Trimethoprim Hives/ Urticaria       Current Outpatient Medications:     albuterol sulfate (PROVENTIL OR VENTOLIN OR PROAIR) 90 mcg/actuation Inhalation HFA Aerosol Inhaler, INHALE TWO PUFFS BY MOUTH EVERY 4 HOURS AS NEEDED FOR WHEEZING OR SHORTNESS OF BREATH., Disp: , Rfl:     drospirenone, contraceptive, 4 mg (28) Oral Tablet, Take 4 mg by mouth, Disp: , Rfl:     hydrOXYzine pamoate (VISTARIL) 25 mg Oral Capsule, Take 1 Capsule (25 mg total) by mouth Three times a day as needed for Itching or Anxiety, Disp: 30 Capsule, Rfl: 1    ondansetron (ZOFRAN) 4 mg Oral Tablet, Take 1 Tab (4 mg total) by mouth Every 8  hours as needed for Nausea/Vomiting, Disp: 15 Tab, Rfl: 0    venlafaxine (EFFEXOR) 37.5 mg Oral Tablet, Take 1 Tablet (37.5 mg total) by mouth Once a day, Disp: 30 Tablet, Rfl: 1  Past Medical History:   Diagnosis Date    Anxiety state     Endometriosis     Foot fracture     Right    Gastroesophageal reflux disease 02/19/2014    HA (headache)     Hashimoto's thyroiditis     Hemorrhage of right ovary 02/19/2015    Hypothyroidism     Obesity     Vitamin D deficiency          Past Surgical History:   Procedure Laterality Date    FOOT SURGERY Right     GALLBLADDER SURGERY      Cholecystectomy    HX OTHER  10/30/2015    DIAG LAPARO SEPARATE PROC - fulgration of endometriotic implants    HX OTHER      ORIF Right foot    HX TONSIL AND ADENOIDECTOMY      LAPAROSCOPIC ENDOMETRIOSIS FULGURATION      LAPAROSCOPIC ENDOMETRIOSIS FULGURATION      2nd surgery was to also drain a blood clot.         Social History     Socioeconomic History    Marital status:  Single     Spouse name: Not on file    Number of children: Not on file    Years of education: Not on file    Highest education level: Not on file   Occupational History    Not on file   Tobacco Use    Smoking status: Never Smoker    Smokeless tobacco: Never Used   Vaping Use    Vaping Use: Every day   Substance and Sexual Activity    Alcohol use: Never    Drug use: Never    Sexual activity: Yes     Birth control/protection: Condom   Other Topics Concern    Not on file   Social History Narrative    Not on file     Social Determinants of Health     Financial Resource Strain:     Difficulty of Paying Living Expenses:    Food Insecurity:     Worried About Programme researcher, broadcasting/film/video in the Last Year:     Barista in the Last Year:    Transportation Needs:     Freight forwarder (Medical):     Lack of Transportation (Non-Medical):    Physical Activity:     Days of Exercise per Week:     Minutes of Exercise per Session:    Stress:     Feeling  of Stress :    Intimate Partner Violence:     Fear of Current or Ex-Partner:     Emotionally Abused:     Physically Abused:     Sexually Abused:          ROS:  General:  Denies significant weight change, fevers, chills, fatigue.  Eyes:  Denies visual changes.  HENT:  Denies nasal congestion, hearing loss, ear pain, sore throat.  Neck:  Denies neck fullness, pain.  Heart:  Denies chest pain, palpitations.  Lungs:  Denies SOB, pleuritic pain, cough.  Gastrointestinal: Denies any N/V/D, constipation. + abdominal pain.  Genitourinary: Denies any dysuria or hematuria  Musculoskeletal: Denies any joint pain, edema.  Neurologic: Denies headaches, numbness, tingling.  Integumentary : Denies any rashes, lesions, wounds.  Psychiatric: + anxiety, denies depression, denies suicidal homicidal ideations.  Otherwise all other review of systems are negative    Objective:    Physical Exam:  Vitals:    08/06/19 1539   BP: 122/84   Pulse: 85   Temp: 36.2 C (97.1 F)   SpO2: 98%   Weight: 87.6 kg (193 lb 3.2 oz)   Height: 1.575 m (5\' 2" )   BMI: 35.41     Facility age limit for growth percentiles is 20 years.    General:  Well-appearing in no acute distress.  Skin:  Warm, dry, normal.  Without rash, lesion.  Eyes:  Pupils are reactive to light.  EOM intact.  Conjunctiva pink.  No scleral icterus.  HENT:  Gross hearing intact.  Ear canals without cerumen impaction.  Tympanic membranes without erythema, effusion.  No pharyngeal lesions, exudate.  Tongue midline.  Neck:  Supple.    Heart:  RRR.   Lungs:  Lungs are clear to auscultation bilaterally.  No wheezes, crackles, rubs.  Abdomen:  + tenderness to palpation throughout.  Bowel sounds are normal.  Musculoskeletal:  Back is nontender to palpation.  No clubbing, cyanosis, edema.  Neuro:  Alert, oriented, cooperative.  Gait is stable.  No focal findings.  Psychiatric:  Affect tearful.      Assessment and Plan:  Orders Placed This Encounter    CANCELED: OUTSIDE CONSULT/REFERRAL  PROVIDER(AMB)    OUTSIDE CONSULT/REFERRAL PROVIDER(AMB)    OUTSIDE CONSULT/REFERRAL PROVIDER(AMB)    venlafaxine (EFFEXOR) 37.5 mg Oral Tablet    hydrOXYzine pamoate (VISTARIL) 25 mg Oral Capsule     (F41.9) Anxiety  (primary encounter diagnosis)  Plan: OUTSIDE CONSULT/REFERRAL PROVIDER(AMB),         CANCELED: OUTSIDE CONSULT/REFERRAL         PROVIDER(AMB)        I advised patient she needs to see Psychiatry.  We did discuss tapering off of the Effexor.  A refill of Vistaril was sent to the pharmacy for the patient since she tells me this has worked the best.  Multiple SSRIs and an SNRI has been ineffective for the patient's anxiety.  Proceed with psychiatry as recommended.  Patient advised to proceed to the emergency department should her symptoms worsen.    (F41.0) Panic attacks  Plan: OUTSIDE CONSULT/REFERRAL PROVIDER(AMB),         CANCELED: OUTSIDE CONSULT/REFERRAL         PROVIDER(AMB)         I advised patient she needs to see Psychiatry.  We did discuss tapering off of the Effexor.  A refill of Vistaril was sent to the pharmacy for the patient since she tells me this has worked the best.  Multiple SSRIs and an SNRI has been ineffective for the patient's anxiety.  Proceed with psychiatry as recommended.  Patient advised to proceed to the emergency department should her symptoms worsen.    (R10.9) Abdominal pain, unspecified abdominal location  Plan: OUTSIDE CONSULT/REFERRAL PROVIDER(AMB)        Patient has been complaining of diffuse abdominal pain for months.  She has seen Gastroenterology as well as Gynecology.  Patient advised to follow with Gastroenterology and Gynecology as scheduled.  She has been unable to get a hold of her gastroenterologists at [facility] and is requesting a gastroenterologist locally.  A referral has been placed for [facility].    (E66.9) Obesity (BMI 35.0-39.9 without comorbidity)  Plan:  Patient made aware her BMI places her in the obesity category discussed weight reduction through  lifestyle modifications with healthy diet and exercise.      Follow Up:  Return for routine follow-up as scheduled or if symptoms worsen or fail to improve.  Patient instructed to call the office with questions or concerns in the interim.    This dictation was transcribed using voice recognition software and may contain minor typographical errors.

## 2019-08-13 ENCOUNTER — Ambulatory Visit (INDEPENDENT_AMBULATORY_CARE_PROVIDER_SITE_OTHER): Payer: Self-pay | Admitting: Family Medicine

## 2019-08-13 NOTE — Telephone Encounter (Signed)
Patient advised to report to ED, states she will go to [facility].

## 2019-08-13 NOTE — Telephone Encounter (Signed)
Regarding: Talaman-Perez/Pt requesting call back  ----- Message from Kenn File sent at 08/13/2019  2:10 PM EDT -----  Peter Minium, MD    Patient calling in and stating that she called earlier and they made her an appt.  Now she is calling back and is feeling worse, with symptoms of fever 101.3  Vomiting, dizziness, her vision is blurry and feeling like she is going to pass out.  I tried going down the triage line noone available at time of call.  Please advise.    Thanks  [employer]

## 2019-08-14 ENCOUNTER — Encounter (INDEPENDENT_AMBULATORY_CARE_PROVIDER_SITE_OTHER): Payer: Self-pay | Admitting: Family Medicine

## 2019-08-17 ENCOUNTER — Emergency Department (HOSPITAL_COMMUNITY): Payer: Self-pay | Admitting: EMERGENCY MEDICINE

## 2019-08-17 ENCOUNTER — Inpatient Hospital Stay (HOSPITAL_COMMUNITY)
Admission: EM | Admit: 2019-08-17 | Discharge: 2019-08-17 | Disposition: A | Discharge status: Home / Self Care | Payer: No Typology Code available for payment source | Source: Other Acute Inpatient Hospital

## 2019-08-17 DIAGNOSIS — R10813 Right lower quadrant abdominal tenderness: Secondary | ICD-10-CM

## 2019-08-17 DIAGNOSIS — Z20822 Contact with and (suspected) exposure to covid-19: Secondary | ICD-10-CM

## 2019-08-17 DIAGNOSIS — N83201 Unspecified ovarian cyst, right side: Secondary | ICD-10-CM

## 2019-08-22 ENCOUNTER — Encounter (INDEPENDENT_AMBULATORY_CARE_PROVIDER_SITE_OTHER): Payer: Self-pay | Admitting: NURSE PRACTITIONER

## 2019-08-22 ENCOUNTER — Inpatient Hospital Stay (INDEPENDENT_AMBULATORY_CARE_PROVIDER_SITE_OTHER): Payer: Self-pay | Admitting: Family

## 2019-08-23 ENCOUNTER — Inpatient Hospital Stay (INDEPENDENT_AMBULATORY_CARE_PROVIDER_SITE_OTHER): Payer: No Typology Code available for payment source | Admitting: Internal Medicine

## 2019-09-09 ENCOUNTER — Ambulatory Visit (INDEPENDENT_AMBULATORY_CARE_PROVIDER_SITE_OTHER): Payer: Self-pay | Admitting: Family Medicine

## 2019-09-09 NOTE — Telephone Encounter (Signed)
Patient states she developed congestion, headache, wheezing, "chest pressure", nausea, vomiting, diarrhea Saturday night. She was tested for covid at [facility] yesterday, negative. Advised patient to report to ER with symptoms of chest pressure and dehydration. Verbalized understanding.

## 2019-09-09 NOTE — Telephone Encounter (Signed)
Regarding: call back request  ----- Message from Marveen Reeks II sent at 09/09/2019  9:13 AM EDT -----  Peter Minium, MD    Pt calling in requesting a call back from the nurse to discuss if they should go to the hospital for dehydration. Please advise. Thank you.

## 2019-09-10 ENCOUNTER — Encounter (INDEPENDENT_AMBULATORY_CARE_PROVIDER_SITE_OTHER): Payer: Self-pay | Admitting: NURSE PRACTITIONER

## 2019-09-10 ENCOUNTER — Telehealth (INDEPENDENT_AMBULATORY_CARE_PROVIDER_SITE_OTHER): Payer: Self-pay | Admitting: NURSE PRACTITIONER

## 2019-09-10 ENCOUNTER — Other Ambulatory Visit: Payer: Self-pay

## 2019-09-10 ENCOUNTER — Ambulatory Visit (INDEPENDENT_AMBULATORY_CARE_PROVIDER_SITE_OTHER): Payer: No Typology Code available for payment source | Admitting: NURSE PRACTITIONER

## 2019-09-10 VITALS — BP 124/86 | HR 104 | Temp 97.9°F | Ht 62.0 in | Wt 180.0 lb

## 2019-09-10 DIAGNOSIS — R519 Headache, unspecified: Secondary | ICD-10-CM

## 2019-09-10 DIAGNOSIS — R112 Nausea with vomiting, unspecified: Secondary | ICD-10-CM

## 2019-09-10 DIAGNOSIS — E669 Obesity, unspecified: Secondary | ICD-10-CM

## 2019-09-10 DIAGNOSIS — J029 Acute pharyngitis, unspecified: Secondary | ICD-10-CM

## 2019-09-10 DIAGNOSIS — R109 Unspecified abdominal pain: Secondary | ICD-10-CM

## 2019-09-10 LAB — POCT RAPID STREP A: RAPID STREP A (POCT): NEGATIVE

## 2019-09-10 MED ORDER — ALBUTEROL SULFATE HFA 90 MCG/ACTUATION AEROSOL INHALER
1.0000 | INHALATION_SPRAY | Freq: Four times a day (QID) | RESPIRATORY_TRACT | 0 refills | Status: DC | PRN
Start: 2019-09-10 — End: 2019-12-24

## 2019-09-10 MED ORDER — GUAIFENESIN ER 600 MG TABLET, EXTENDED RELEASE 12 HR
1200.00 mg | EXTENDED_RELEASE_TABLET | Freq: Two times a day (BID) | ORAL | 0 refills | Status: DC | PRN
Start: 2019-09-10 — End: 2019-12-24

## 2019-09-10 MED ORDER — FLUTICASONE PROPIONATE 50 MCG/ACTUATION NASAL SPRAY,SUSPENSION
1.00 | Freq: Every day | NASAL | 0 refills | Status: DC
Start: 2019-09-10 — End: 2019-12-24

## 2019-09-10 NOTE — Telephone Encounter (Signed)
Referral placed. Thank you

## 2019-09-10 NOTE — Nursing Note (Signed)
09/10/19 1000   Rapid Strep   Time Performed 1019   Rapid Strep Negative   Lot# [id number]   Expiration Date 02/23/20   Internal Control Valid yes   Nurse initials ib   Ordering Physician chaikcic   Throat Culture Sent Yes

## 2019-09-10 NOTE — Telephone Encounter (Signed)
When pt stopped at check out she thought she was being referred to GI.  Please advise.

## 2019-09-10 NOTE — Progress Notes (Signed)
PRIMARY CARE, [facility]  [address] [place] [address]  Phone: [phone]  Fax: [phone]    Encounter Date: 09/10/2019    Patient ID:  Traci [name]  MVH:[id number]    DOB: 11/17/2000  Age: 19 y.o. female    Subjective:     Chief Complaint   Patient presents with    Congestion     The patient is here today for an acute appointment.  She is complaining of sinus congestion, sore throat, stomach pain, headache, diarrhea, and vomiting since Sunday.  She reports she was tested for COVID at [facility] on Sunday and was told those results are negative.  Her symptoms did not start until Sunday.  She reports she has been trying Dayquil and Nyquil at home without relief.  She tells me she has been running a fever at home which has come down with Tylenol.  She denies cough or shortness of breath.  Additionally, she tells me she would like to have another referral for gastroenterology.  She has an appointment scheduled and will need to have a referral.          Current Outpatient Medications   Medication Sig    albuterol sulfate (PROVENTIL OR VENTOLIN OR PROAIR) 90 mcg/actuation Inhalation HFA Aerosol Inhaler INHALE TWO PUFFS BY MOUTH EVERY 4 HOURS AS NEEDED FOR WHEEZING OR SHORTNESS OF BREATH.    drospirenone, contraceptive, 4 mg (28) Oral Tablet Take 4 mg by mouth    hydrOXYzine pamoate (VISTARIL) 25 mg Oral Capsule Take 1 Capsule (25 mg total) by mouth Three times a day as needed for Itching or Anxiety    ondansetron (ZOFRAN) 4 mg Oral Tablet Take 1 Tab (4 mg total) by mouth Every 8 hours as needed for Nausea/Vomiting    venlafaxine (EFFEXOR) 37.5 mg Oral Tablet Take 1 Tablet (37.5 mg total) by mouth Once a day (Patient not taking: Reported on 09/10/2019)     Allergies   Allergen Reactions    Sulfamethoxazole-Trimethoprim Hives/ Urticaria     Past Medical History:   Diagnosis Date    Anxiety state     Endometriosis     Foot fracture     Right    Gastroesophageal reflux disease  02/19/2014    HA (headache)     Hashimoto's thyroiditis     Hemorrhage of right ovary 02/19/2015    Hypothyroidism     Obesity     Vitamin D deficiency          Past Surgical History:   Procedure Laterality Date    FOOT SURGERY Right     GALLBLADDER SURGERY      Cholecystectomy    HX OTHER  10/30/2015    DIAG LAPARO SEPARATE PROC - fulgration of endometriotic implants    HX OTHER      ORIF Right foot    HX TONSIL AND ADENOIDECTOMY      LAPAROSCOPIC ENDOMETRIOSIS FULGURATION      LAPAROSCOPIC ENDOMETRIOSIS FULGURATION      2nd surgery was to also drain a blood clot.         Family Medical History:     Problem Relation (Age of Onset)    No Known Problems Mother, Father            Social History     Tobacco Use    Smoking status: Never Smoker    Smokeless tobacco: Never Used   Vaping Use    Vaping Use: Every day  Substance Use Topics    Alcohol use: Never    Drug use: Never       Review of Systems   Constitutional: Negative.    HENT: Positive for congestion and sore throat.    Eyes: Negative.    Respiratory: Negative.    Cardiovascular: Negative.    Gastrointestinal: Positive for abdominal pain, diarrhea, nausea and vomiting.   Endocrine: Negative.    Genitourinary: Negative.    Musculoskeletal: Negative.    Skin: Negative.    Neurological: Positive for headaches.   Hematological: Negative.    Psychiatric/Behavioral: Negative.      Objective:   Vitals: BP 124/86 (Site: Left, Patient Position: Sitting, Cuff Size: Adult Large)    Pulse (!) 104    Temp 36.6 C (97.9 F) (Thermal Scan)    Ht 1.575 m (5\' 2" )    Wt 81.6 kg (180 lb)    SpO2 99%    BMI 32.92 kg/m   Facility age limit for growth percentiles is 20 years.      Physical Exam  Constitutional:       Appearance: Normal appearance.   HENT:      Head: Normocephalic and atraumatic.      Right Ear: Tympanic membrane normal.      Left Ear: Tympanic membrane normal.      Nose: Nose normal.      Mouth/Throat:      Mouth: Mucous membranes are moist.       Pharynx: Oropharynx is clear. Posterior oropharyngeal erythema present. No pharyngeal swelling, oropharyngeal exudate or uvula swelling.      Tonsils: No tonsillar exudate or tonsillar abscesses.   Eyes:      Extraocular Movements: Extraocular movements intact.      Pupils: Pupils are equal, round, and reactive to light.   Cardiovascular:      Rate and Rhythm: Normal rate and regular rhythm.      Pulses: Normal pulses.      Heart sounds: Normal heart sounds.   Pulmonary:      Effort: Pulmonary effort is normal.      Breath sounds: Normal breath sounds.   Abdominal:      Palpations: Abdomen is soft.      Tenderness: There is no abdominal tenderness.   Musculoskeletal:         General: Normal range of motion.      Cervical back: Normal range of motion and neck supple.   Skin:     General: Skin is warm and dry.      Capillary Refill: Capillary refill takes less than 2 seconds.   Neurological:      General: No focal deficit present.      Mental Status: She is alert and oriented to person, place, and time.   Psychiatric:         Mood and Affect: Mood normal.         Behavior: Behavior normal.         Assessment & Plan:       ICD-10-CM    1. Sore throat  J02.9 POCT RAPID STREP A     Throat Culture Beta Hemolytic Strep     COVID-19 SCREENING OUTPATIENT AND DRIVE UP TESTING   2. Headache  R51.9 POCT RAPID STREP A     Throat Culture Beta Hemolytic Strep     COVID-19 SCREENING OUTPATIENT AND DRIVE UP TESTING   3. Abdominal pain, unspecified abdominal location  R10.9 COVID-19 SCREENING OUTPATIENT AND DRIVE UP TESTING  OUTSIDE CONSULT/REFERRAL PROVIDER(AMB)   4. Nausea & vomiting  R11.2 COVID-19 SCREENING OUTPATIENT AND DRIVE UP TESTING     OUTSIDE CONSULT/REFERRAL PROVIDER(AMB)   5. Obesity (BMI 30.0-34.9)  E66.9      Orders Placed This Encounter    Throat Culture Beta Hemolytic Strep    COVID-19 SCREENING OUTPATIENT AND DRIVE UP TESTING    OUTSIDE CONSULT/REFERRAL PROVIDER(AMB)    POCT RAPID STREP A    guaiFENesin  (MUCINEX) 600 mg Oral Tablet Extended Release 12hr    fluticasone propionate (FLONASE) 50 mcg/actuation Nasal Spray, Suspension    albuterol sulfate (PROAIR HFA) 90 mcg/actuation Inhalation HFA Aerosol Inhaler     Rapid strep in the office today was negative.  The specimen will be sent to the lab for culture.  I did order COVID-19 screening on the patient, I feel she may have been tested to soon.  She was advised if no improvement or worsening of symptoms by Wednesday or Thursday to have her test repeated.  Mucinex, Flonase, albuterol sent to the pharmacy for the patient.  She does have some Zofran at home.  She was advised on brat diet and advanced as tolerated.  Advised patient to return to clinic for any worsening of symptoms.  I did place referral today for Gastroenterology for the patient.    Advised to keep routine follow-up appointment as scheduled.    [employer], CRNP

## 2019-09-10 NOTE — Nursing Note (Signed)
Patient is here today with sinus congestion, sore throat, stomach pain, headache, diarrhea, vomitning. This has been since Sunday. Patient was covid tested at [facility] on Sunday results were negative.

## 2019-09-11 ENCOUNTER — Ambulatory Visit (INDEPENDENT_AMBULATORY_CARE_PROVIDER_SITE_OTHER): Payer: Self-pay | Admitting: Family Medicine

## 2019-09-11 NOTE — Telephone Encounter (Signed)
Regarding: Traci [name] pt//return call   ----- Message from Alen Blew sent at 09/11/2019 10:37 AM EDT -----  Peter Minium, MD     PT states that her GI doctor prescribed her a medication but when she looked online, she said it showed an interaction with some of her medication prescribed by Dr Norberto Sorenson     She says she was prescribed Compazine by GI but Dr Norberto Sorenson has her on Zofran     She is asking for a return call to discuss     Please advise   Thank you

## 2019-09-11 NOTE — Telephone Encounter (Signed)
Patient instructed to call [facility]. Verbalized understanding

## 2019-09-12 ENCOUNTER — Other Ambulatory Visit (INDEPENDENT_AMBULATORY_CARE_PROVIDER_SITE_OTHER): Payer: Self-pay | Admitting: NURSE PRACTITIONER

## 2019-09-12 DIAGNOSIS — R519 Headache, unspecified: Secondary | ICD-10-CM

## 2019-09-12 DIAGNOSIS — J029 Acute pharyngitis, unspecified: Secondary | ICD-10-CM

## 2019-09-16 ENCOUNTER — Other Ambulatory Visit (INDEPENDENT_AMBULATORY_CARE_PROVIDER_SITE_OTHER): Payer: Self-pay | Admitting: NURSE PRACTITIONER

## 2019-09-16 DIAGNOSIS — R519 Headache, unspecified: Secondary | ICD-10-CM

## 2019-09-16 DIAGNOSIS — R112 Nausea with vomiting, unspecified: Secondary | ICD-10-CM

## 2019-09-16 DIAGNOSIS — R109 Unspecified abdominal pain: Secondary | ICD-10-CM

## 2019-09-16 DIAGNOSIS — J029 Acute pharyngitis, unspecified: Secondary | ICD-10-CM

## 2019-09-26 ENCOUNTER — Ambulatory Visit (INDEPENDENT_AMBULATORY_CARE_PROVIDER_SITE_OTHER): Payer: Self-pay | Admitting: Family

## 2019-09-26 ENCOUNTER — Ambulatory Visit (INDEPENDENT_AMBULATORY_CARE_PROVIDER_SITE_OTHER): Payer: No Typology Code available for payment source | Admitting: Family

## 2019-11-01 ENCOUNTER — Encounter (INDEPENDENT_AMBULATORY_CARE_PROVIDER_SITE_OTHER): Payer: Self-pay | Admitting: NURSE PRACTITIONER

## 2019-11-04 ENCOUNTER — Encounter (INDEPENDENT_AMBULATORY_CARE_PROVIDER_SITE_OTHER): Payer: No Typology Code available for payment source | Admitting: NURSE PRACTITIONER

## 2019-11-07 ENCOUNTER — Encounter (INDEPENDENT_AMBULATORY_CARE_PROVIDER_SITE_OTHER): Payer: No Typology Code available for payment source | Admitting: NURSE PRACTITIONER

## 2019-11-26 ENCOUNTER — Encounter (INDEPENDENT_AMBULATORY_CARE_PROVIDER_SITE_OTHER): Payer: Self-pay | Admitting: PHYSICIAN ASSISTANT

## 2019-12-17 ENCOUNTER — Encounter (INDEPENDENT_AMBULATORY_CARE_PROVIDER_SITE_OTHER): Payer: Self-pay | Admitting: PHYSICIAN ASSISTANT

## 2019-12-24 ENCOUNTER — Other Ambulatory Visit: Payer: Self-pay

## 2019-12-24 ENCOUNTER — Other Ambulatory Visit: Payer: No Typology Code available for payment source | Attending: PHYSICIAN ASSISTANT | Admitting: PHYSICIAN ASSISTANT

## 2019-12-24 ENCOUNTER — Ambulatory Visit (INDEPENDENT_AMBULATORY_CARE_PROVIDER_SITE_OTHER): Payer: No Typology Code available for payment source | Admitting: PHYSICIAN ASSISTANT

## 2019-12-24 ENCOUNTER — Encounter (INDEPENDENT_AMBULATORY_CARE_PROVIDER_SITE_OTHER): Payer: Self-pay | Admitting: PHYSICIAN ASSISTANT

## 2019-12-24 VITALS — BP 138/88 | HR 76 | Temp 99.2°F | Ht 62.0 in | Wt 183.0 lb

## 2019-12-24 DIAGNOSIS — R300 Dysuria: Secondary | ICD-10-CM

## 2019-12-24 DIAGNOSIS — Z79899 Other long term (current) drug therapy: Secondary | ICD-10-CM

## 2019-12-24 DIAGNOSIS — E559 Vitamin D deficiency, unspecified: Secondary | ICD-10-CM

## 2019-12-24 DIAGNOSIS — R519 Headache, unspecified: Secondary | ICD-10-CM

## 2019-12-24 DIAGNOSIS — R350 Frequency of micturition: Secondary | ICD-10-CM

## 2019-12-24 DIAGNOSIS — E611 Iron deficiency: Secondary | ICD-10-CM

## 2019-12-24 DIAGNOSIS — R109 Unspecified abdominal pain: Secondary | ICD-10-CM | POA: Insufficient documentation

## 2019-12-24 DIAGNOSIS — M545 Low back pain, unspecified: Secondary | ICD-10-CM

## 2019-12-24 DIAGNOSIS — N809 Endometriosis, unspecified: Secondary | ICD-10-CM

## 2019-12-24 DIAGNOSIS — Z7951 Long term (current) use of inhaled steroids: Secondary | ICD-10-CM

## 2019-12-24 DIAGNOSIS — E063 Autoimmune thyroiditis: Secondary | ICD-10-CM

## 2019-12-24 LAB — POCT URINE DIPSTICK
BILIRUBIN: 1
GLUCOSE: NORMAL
KETONE: 5
LEUKOCYTES: 15
NITRITE: NEGATIVE
PH: 6.5
PROTEIN: 15
SPECIFIC GRAVITY: 1.02
UROBILINOGEN: 0.2

## 2019-12-24 MED ORDER — CEPHALEXIN 500 MG CAPSULE
500.0000 mg | ORAL_CAPSULE | Freq: Two times a day (BID) | ORAL | 0 refills | Status: AC
Start: 2019-12-24 — End: 2019-12-31

## 2019-12-24 NOTE — Nursing Note (Signed)
12/24/19 1500   Urine test  (Siemens Multistix 10 SG)   Time collected 1511   Color Yellow   Clarity Cloudy   Glucose Negative   Bilirubin (!) 1+   Ketones (!) Trace (5 mg/dl)   Urine Specific Gravity 1.020   Blood (urine) (!) Small (1+)   pH 6.5   Protein (!) Trace   Urobilinogen 0.2mg /dL (Normal)   Nitrite Negative   Leukocytes Trace   Lot # [id number]   Expiration Date 06/28/21   Initials ib

## 2019-12-24 NOTE — Nursing Note (Signed)
Patient states for about 6 days she has been having flank pain. And last 2 weeks she has had burning and frequency.

## 2019-12-24 NOTE — Progress Notes (Signed)
PRIMARY CARE, [facility]  [address] [place] [address]       Name: Traci [name] MRN:  [id number]   Date: 12/24/2019 Age: 19 y.o.        Traci [name] is a 19 y.o. female who presents to the office today for   Chief Complaint   Patient presents with   . Low Back Pain   . Pain on Urination   . Urinary Frequency           Subjective:   Traci [name] is a 19 y.o. female with PMH of endometriosis, right hemorrhagic ovarian cyst who presents to the office today for concerns of dysuria and urinary frequency x's 2 weeks.  She states that symptoms are not improving and she is concerned that she has a UTI.  She reports history of frequent UTIs given history of endometriosis.  She does follow with Urogynecology at [facility].  patient notes that she developed low back pain starting yesterday which concerned her as well.  She denies any kidney pain.  She does admit to intermittent fevers with T-max of 100 101.2 on Friday.  She denies any nausea or vomiting.  She denies any hematuria or vaginal bleeding.  Patient denies any history of nephrolithiasis.  She is able to tolerate p.o. intake without any issues.    Patient also reports frequent headaches that started following initiation of Depo-Provera.  She states that her gynecologist told her to discuss this with her PCP.  Patient has no prior history of headaches or migraines before starting Depo-Provera according to patient.  She denies any associated symptoms including nausea, vomiting, floaters or blurry vision.  She notes that her blood pressure has been elevated as well and is not sure if this has been contributing to her symptoms.  She states that her sister has been checking her blood pressure and the other day it was around 180/110.  No chest pain or dizziness.  She has no known history of hypertension.    Patient has not had any recent blood work on file and is willing to have this completed at this time.  She does have history of Hashimoto's  thyroiditis which she states was diagnosed at age 3.  She says that she was taken off of Synthroid at around age 64 due to normal labs consecutively.  She has not had any recent thyroid testing completed.  She does also note history of iron deficiency anemia and vitamin-D deficiency.  She is not currently taking over-the-counter supplements.      Review of Systems   Constitutional: Positive for fever. Negative for chills.   Respiratory: Negative for cough and shortness of breath.    Gastrointestinal: Negative for abdominal pain, nausea and vomiting.   Genitourinary: Positive for dysuria and frequency. Negative for flank pain and hematuria.   Musculoskeletal: Positive for back pain.   Neurological: Positive for headaches.        Past Medical History:   Diagnosis Date   . Anxiety state    . Endometriosis    . Foot fracture     Right   . Gastroesophageal reflux disease 02/19/2014   . HA (headache)    . Hashimoto's thyroiditis    . Hemorrhage of right ovary 02/19/2015   . Hypothyroidism    . Obesity    . Vitamin D deficiency             Current Outpatient Medications   Medication  Sig   . albuterol sulfate (PROAIR HFA) 90 mcg/actuation Inhalation HFA Aerosol Inhaler Take 1-2 Puffs by inhalation Every 6 hours as needed   . albuterol sulfate (PROVENTIL OR VENTOLIN OR PROAIR) 90 mcg/actuation Inhalation HFA Aerosol Inhaler INHALE TWO PUFFS BY MOUTH EVERY 4 HOURS AS NEEDED FOR WHEEZING OR SHORTNESS OF BREATH.   . drospirenone, contraceptive, 4 mg (28) Oral Tablet Take 4 mg by mouth   . fluticasone propionate (FLONASE) 50 mcg/actuation Nasal Spray, Suspension 1 Spray by Each Nostril route Once a day   . guaiFENesin (MUCINEX) 600 mg Oral Tablet Extended Release 12hr Take 2 Tablets (1,200 mg total) by mouth Twice per day as needed (congestion)   . hydrOXYzine pamoate (VISTARIL) 25 mg Oral Capsule Take 1 Capsule (25 mg total) by mouth Three times a day as needed for Itching or Anxiety   . ondansetron (ZOFRAN) 4 mg Oral Tablet  Take 1 Tab (4 mg total) by mouth Every 8 hours as needed for Nausea/Vomiting   . venlafaxine (EFFEXOR) 37.5 mg Oral Tablet Take 1 Tablet (37.5 mg total) by mouth Once a day (Patient not taking: Reported on 09/10/2019)         Objective:   BP 138/88 (Site: Left, Patient Position: Sitting, Cuff Size: Adult Large)   Pulse 76   Temp 37.3 C (99.2 F) (Thermal Scan)   Ht 1.575 m (5\' 2" )   Wt 83 kg (183 lb)   SpO2 98%   BMI 33.47 kg/m       Physical Exam  Constitutional:       General: She is not in acute distress.     Appearance: Normal appearance. She is normal weight. She is not toxic-appearing.   HENT:      Head: Normocephalic and atraumatic.      Nose: Nose normal.      Mouth/Throat:      Mouth: Mucous membranes are moist.   Cardiovascular:      Rate and Rhythm: Normal rate and regular rhythm.      Pulses: Normal pulses.      Heart sounds: No murmur heard.     Pulmonary:      Effort: Pulmonary effort is normal. No respiratory distress.      Breath sounds: Normal breath sounds.   Abdominal:      Tenderness: There is no right CVA tenderness or left CVA tenderness.   Musculoskeletal:      Comments: Mild tenderness to palpation of lumbar region.    Neurological:      General: No focal deficit present.      Mental Status: She is alert and oriented to person, place, and time.   Psychiatric:         Mood and Affect: Mood normal.         Behavior: Behavior normal.          Urine Dip  LEUKOCYTES   Date Value Ref Range Status   12/24/2019 15  Final     NITRITE   Date Value Ref Range Status   12/24/2019 negative  Final     UROBILINOGEN   Date Value Ref Range Status   12/24/2019 0.2  Final     PROTEIN   Date Value Ref Range Status   12/24/2019 15  Final     PH   Date Value Ref Range Status   12/24/2019 6.5  Final     BLOOD   Date Value Ref Range Status   12/24/2019 5-10  Final  SPECIFIC GRAVITY   Date Value Ref Range Status   12/24/2019 1.020  Final     KETONE   Date Value Ref Range Status   12/24/2019 5  Final      BILIRUBIN   Date Value Ref Range Status   12/24/2019 1  Final     GLUCOSE   Date Value Ref Range Status   12/24/2019 normal  Final         Assessment/Plan:   1. Dysuria  - POCT URINE DIPSTICK  - Urine Culture, Routine; Future  - CBC; Future  - cephalexin (KEFLEX) 500 mg Oral Capsule; Take 1 Capsule (500 mg total) by mouth Twice daily for 7 days  Dispense: 14 Capsule; Refill: 0  - Comp Metabolic Panel-Fasting; Future    2. Urinary frequency  - POCT URINE DIPSTICK  - Urine Culture, Routine; Future  - CBC; Future  - cephalexin (KEFLEX) 500 mg Oral Capsule; Take 1 Capsule (500 mg total) by mouth Twice daily for 7 days  Dispense: 14 Capsule; Refill: 0    3. Low back pain  - POCT URINE DIPSTICK  - Urine Culture, Routine; Future  - CBC; Future  - cephalexin (KEFLEX) 500 mg Oral Capsule; Take 1 Capsule (500 mg total) by mouth Twice daily for 7 days  Dispense: 14 Capsule; Refill: 0    4. Headache  Patient will continue to monitor and follow up in two weeks.  She notes blood pressure has been elevated at home intermittently over the past several weeks.  May be due to infection but stable in the office today.  Advised patient to keep a log of her blood pressure readings and bring to her next appointment in 2 weeks.  Patient notes that headaches only occurred after starting Depo-Provera.  Denies any history of migraines or any associated symptoms.  Can discuss more at next office visit but may need to discuss with gynecologist if no other etiology present for headaches and history of symptoms starting following initiation of Depo-Provera.  - CBC; Future    5. Hashimoto's thyroiditis  Not on Synthroid.  States she was taken off of Synthroid at age 19 due to normal thyroid labs consistently.  Will check at this time to ensure stable.  - TSH w/ Free T4 Reflex; Future    6. Endometriosis  Continue follow-up with gynecology.  - CBC; Future    7. Iron deficiency  - CBC; Future  - Ferritin; Future  - Iron, Total and Total Iron  Binding Capacity; Future    8. Vitamin D deficiency  - VITAMIN D 25 TOTAL; Future    Patient is to call the office or report to the ER with any new or worsening symptoms including no improvement in symptoms on antibiotics, high fevers that are not coming down, vomiting, flank pain, lethargy or inability to tolerate fluids/oral intake.  Patient verbalized understanding.  She does have low-grade fever today in the office of 99. Urine dip was positive for leukocytes and blood but negative for nitrites.  Will send off for urine culture bowl start patient on antibiotic at this time given symptoms and history of frequent UTIs.  Advised patient will call pending culture results if changes need be made at that time.  Vitals stable in the office and she does not appear to be in any acute distress at this time. She is to follow-up in the office in 2 weeks or sooner if needed.     Orders Placed This Encounter   .  Urine Culture, Routine   . CBC   . TSH w/ Free T4 Reflex   . Ferritin   . Iron, Total and Total Iron Binding Capacity   . CANCELED: Basic Metabolic Panel-Non Fasting   . VITAMIN D 25 TOTAL   . Comp Metabolic Panel-Fasting   . POCT URINE DIPSTICK   . cephalexin (KEFLEX) 500 mg Oral Capsule         The supervising/collaborating physician on site for this visit was Dr. Cyndi Lennert.           Shea Evans, PA-C  12/24/2019

## 2019-12-26 LAB — URINE CULTURE,ROUTINE: URINE CULTURE: 40000

## 2020-01-07 ENCOUNTER — Encounter (INDEPENDENT_AMBULATORY_CARE_PROVIDER_SITE_OTHER): Payer: Self-pay | Admitting: PHYSICIAN ASSISTANT

## 2020-01-13 ENCOUNTER — Other Ambulatory Visit: Payer: Self-pay

## 2020-01-13 ENCOUNTER — Ambulatory Visit: Payer: No Typology Code available for payment source | Attending: PHYSICIAN ASSISTANT

## 2020-01-13 DIAGNOSIS — E063 Autoimmune thyroiditis: Secondary | ICD-10-CM | POA: Insufficient documentation

## 2020-01-13 DIAGNOSIS — E559 Vitamin D deficiency, unspecified: Secondary | ICD-10-CM | POA: Insufficient documentation

## 2020-01-13 DIAGNOSIS — N809 Endometriosis, unspecified: Secondary | ICD-10-CM | POA: Insufficient documentation

## 2020-01-13 DIAGNOSIS — R519 Headache, unspecified: Secondary | ICD-10-CM | POA: Insufficient documentation

## 2020-01-13 DIAGNOSIS — E611 Iron deficiency: Secondary | ICD-10-CM

## 2020-01-13 DIAGNOSIS — R300 Dysuria: Secondary | ICD-10-CM | POA: Insufficient documentation

## 2020-01-13 DIAGNOSIS — R350 Frequency of micturition: Secondary | ICD-10-CM | POA: Insufficient documentation

## 2020-01-13 LAB — CBC
HCT: 42.9 % (ref 34.8–46.0)
HGB: 14 g/dL (ref 11.5–16.0)
MCH: 26.4 pg (ref 26.0–32.0)
MCHC: 32.6 g/dL (ref 31.0–35.5)
MCV: 80.9 fL (ref 78.0–100.0)
MPV: 11.6 fL (ref 8.7–12.5)
PLATELETS: 300 10*3/uL (ref 150–400)
RBC: 5.3 10*6/uL — ABNORMAL HIGH (ref 3.85–5.22)
RDW-CV: 14 % (ref 11.5–15.5)
WBC: 9.4 10*3/uL (ref 3.7–11.0)

## 2020-01-13 LAB — COMPREHENSIVE METABOLIC PNL, FASTING
ALBUMIN/GLOBULIN RATIO: 1.5 (ref >=1.0)
ALBUMIN: 4.9 g/dL (ref 3.5–5.2)
ALKALINE PHOSPHATASE: 62 U/L (ref 41–133)
ALT (SGPT): 11 U/L (ref 0–55)
ANION GAP: 10 mmol/L (ref 6–15)
AST (SGOT): 14 U/L (ref 5–34)
BILIRUBIN TOTAL: 0.5 mg/dL (ref 0.2–1.2)
BUN: 10 mg/dL (ref 7–21)
CALCIUM: 9.6 mg/dL (ref 8.0–10.6)
CHLORIDE: 105 mmol/L (ref 98–107)
CO2 TOTAL: 24 mmol/L (ref 21–32)
CREATININE: 0.88 mg/dL (ref 0.80–1.60)
ESTIMATED GFR: >60 mL/min/{1.73_m2}
GLUCOSE: 92 mg/dL (ref 70–100)
POTASSIUM: 3.9 mmol/L (ref 3.3–5.1)
PROTEIN TOTAL: 8.2 g/dL (ref 6.4–8.3)
SODIUM: 139 mmol/L (ref 136–146)

## 2020-01-14 ENCOUNTER — Ambulatory Visit (INDEPENDENT_AMBULATORY_CARE_PROVIDER_SITE_OTHER): Payer: Self-pay | Admitting: Family Medicine

## 2020-01-14 DIAGNOSIS — E559 Vitamin D deficiency, unspecified: Secondary | ICD-10-CM

## 2020-01-14 LAB — IRON, TOTAL AND TOTAL IRON BINDING CAPACITY
% SATURATION: 13 % — ABNORMAL LOW (ref 16–45)
IRON BINDING CAPACITY: 408 ug/dL (ref 250–450)
IRON, TOTAL: 53 ug/dL (ref 40–190)

## 2020-01-14 LAB — THYROID STIMULATING HORMONE WITH FREE T4 REFLEX: TSH W/REFLEX TO FT4: 2.89 mIU/L

## 2020-01-14 LAB — URINE CULTURE,ROUTINE: URINE CULTURE: 5000 — AB

## 2020-01-14 LAB — FERRITIN: FERRITIN: 31 ng/mL (ref 16–154)

## 2020-01-14 LAB — VITAMIN D 25 TOTAL: VITAMIN D,25-OH,TOTAL,IA: 22 ng/mL — ABNORMAL LOW (ref 30–100)

## 2020-01-14 MED ORDER — CHOLECALCIFEROL (VITAMIN D3) 25 MCG (1,000 UNIT) TABLET
2000.0000 [IU] | ORAL_TABLET | Freq: Every day | ORAL | 3 refills | Status: DC
Start: 2020-01-14 — End: 2020-01-21

## 2020-01-14 NOTE — Telephone Encounter (Signed)
Okay supplement sent to the pharmacy.

## 2020-01-14 NOTE — Telephone Encounter (Signed)
Regarding: lab questions  ----- Message from Donald Pore [facility] sent at 01/14/2020 11:16 AM EDT -----  Peter Minium, MD  Pt has question regarding lab work she had done. Please call back advise        Thank You Rene Kocher

## 2020-01-14 NOTE — Telephone Encounter (Signed)
Patient aware of labs per Kaytlin:  Please let patient know that vitamin d was low. Would recommend she take 2,000 IU supplement daily if not already taking one. Can send one over if patient is not taking one currently. Thanks    Verbalized understanding. She is not taking vitamin d and would like script sent to pharmacy

## 2020-01-16 ENCOUNTER — Encounter (INDEPENDENT_AMBULATORY_CARE_PROVIDER_SITE_OTHER): Payer: Self-pay | Admitting: PHYSICIAN ASSISTANT

## 2020-01-20 ENCOUNTER — Encounter (INDEPENDENT_AMBULATORY_CARE_PROVIDER_SITE_OTHER): Payer: Self-pay | Admitting: PHYSICIAN ASSISTANT

## 2020-01-21 ENCOUNTER — Ambulatory Visit (INDEPENDENT_AMBULATORY_CARE_PROVIDER_SITE_OTHER): Payer: No Typology Code available for payment source | Admitting: PHYSICIAN ASSISTANT

## 2020-01-21 ENCOUNTER — Encounter (INDEPENDENT_AMBULATORY_CARE_PROVIDER_SITE_OTHER): Payer: Self-pay | Admitting: PHYSICIAN ASSISTANT

## 2020-01-21 ENCOUNTER — Other Ambulatory Visit: Payer: Self-pay

## 2020-01-21 VITALS — BP 136/88 | HR 97 | Temp 97.9°F | Ht 62.0 in | Wt 181.0 lb

## 2020-01-21 DIAGNOSIS — R110 Nausea: Secondary | ICD-10-CM

## 2020-01-21 DIAGNOSIS — K219 Gastro-esophageal reflux disease without esophagitis: Secondary | ICD-10-CM

## 2020-01-21 DIAGNOSIS — E559 Vitamin D deficiency, unspecified: Secondary | ICD-10-CM

## 2020-01-21 DIAGNOSIS — M546 Pain in thoracic spine: Secondary | ICD-10-CM

## 2020-01-21 DIAGNOSIS — M545 Low back pain, unspecified: Secondary | ICD-10-CM

## 2020-01-21 DIAGNOSIS — N920 Excessive and frequent menstruation with regular cycle: Secondary | ICD-10-CM

## 2020-01-21 MED ORDER — FAMOTIDINE 20 MG TABLET
20.0000 mg | ORAL_TABLET | Freq: Two times a day (BID) | ORAL | 0 refills | Status: DC
Start: 2020-01-21 — End: 2020-10-22

## 2020-01-21 MED ORDER — ONDANSETRON HCL 4 MG TABLET
4.0000 mg | ORAL_TABLET | Freq: Three times a day (TID) | ORAL | 0 refills | Status: DC | PRN
Start: 2020-01-21 — End: 2020-05-01

## 2020-01-21 MED ORDER — CHOLECALCIFEROL (VITAMIN D3) 25 MCG (1,000 UNIT) TABLET
2000.0000 [IU] | ORAL_TABLET | Freq: Every day | ORAL | 3 refills | Status: DC
Start: 2020-01-21 — End: 2020-05-01

## 2020-01-21 NOTE — Progress Notes (Signed)
PRIMARY CARE, [facility]  [address] [place] [address]       Name: Traci [name] MRN:  [id number]   Date: 01/21/2020 Age: 19 y.o.        ANACAREN KOHAN is a 19 y.o. female who presents to the office today for   Chief Complaint   Patient presents with    Back Pain       Subjective:   Traci [name] is a 19 y.o. female with PMH of endometriosis who presents to the office today for concerns of back pain. She reports pain has been going on for about 1 month now. No injury or trauma. She was seen last with similar symptoms but had fever and urinary symptoms as well and was treated for UTI. Patient states urinary symptoms have improved and denies any fevers. She reports that pain is worse with bending over or picking heavy objects up. She does admit to some associated numbness/tingling in b/l legs at times. No loss of bowel/bladder function. She works at [employer] and states that work does allow some restriction right now due to her symptoms and does not allow her to lift greater than 10 lbs while working. She is following with a chiropractor and states that they did do xray which she reports were read as normal.     She also reports concern for possible pregnancy and would like serum HCG testing completed. She is following with OBGYN in [place] and states she was told to contact PCP about testing. She has taken two at home urine pregnancy testing which were both negative. She was concerned as she was having some spotting and breast tenderness. She is currently on depo provera and has not missed any injections. Patient reports nausea in the morning and at night. She does have history of GERD and reports she has been having more frequent symptoms. She states that she had been on pepcid twice a day but had been out of medication. She usually follows with [facility] and gets medication filled by them. She does admit to vomiting sometime and says that it tastes likes acid. No abdominal pain.        Review of Systems   Constitutional: Negative for chills and fever.   Cardiovascular: Negative for chest pain.   Gastrointestinal: Positive for heartburn and nausea. Negative for abdominal pain and vomiting.   Genitourinary: Negative for dysuria, frequency, hematuria and urgency.   Musculoskeletal: Positive for back pain. Negative for falls.   Skin: Negative for rash.        Past Medical History:   Diagnosis Date    Anxiety state     Endometriosis     Foot fracture     Right    Gastroesophageal reflux disease 02/19/2014    HA (headache)     Hashimoto's thyroiditis     Hemorrhage of right ovary 02/19/2015    Hypothyroidism     Obesity     Vitamin D deficiency             Current Outpatient Medications   Medication Sig    cholecalciferol, vitamin D3, 25 mcg (1,000 unit) Oral Tablet Take 2 Tablets (2,000 Units total) by mouth Once a day (Patient not taking: Reported on 01/21/2020 )    famotidine (PEPCID) 20 mg Oral Tablet Take 20 mg by mouth Twice daily    hydrOXYzine pamoate (VISTARIL) 25 mg Oral Capsule Take 1 Capsule (25 mg total) by mouth Three  times a day as needed for Itching or Anxiety    methylprednisolone acetate (DEPO-MEDROL INJ) by Injection route    ondansetron (ZOFRAN) 4 mg Oral Tablet Take 1 Tab (4 mg total) by mouth Every 8 hours as needed for Nausea/Vomiting (Patient not taking: Reported on 01/21/2020 )         Objective:     BP 136/88 (Site: Left, Patient Position: Sitting, Cuff Size: Adult Large)    Pulse 97    Temp 36.6 C (97.9 F) (Thermal Scan)    Ht 1.575 m (5\' 2" )    Wt 82.1 kg (181 lb)    SpO2 100%    BMI 33.11 kg/m         Physical Exam  Constitutional:       General: She is not in acute distress.     Appearance: Normal appearance. She is normal weight. She is not toxic-appearing.   HENT:      Head: Normocephalic and atraumatic.      Right Ear: Tympanic membrane and ear canal normal.      Left Ear: Tympanic membrane and ear canal normal.      Nose: Nose normal.       Mouth/Throat:      Mouth: Mucous membranes are moist.   Eyes:      Conjunctiva/sclera: Conjunctivae normal.   Cardiovascular:      Rate and Rhythm: Normal rate and regular rhythm.      Pulses: Normal pulses.      Heart sounds: No murmur heard.     Pulmonary:      Effort: Pulmonary effort is normal. No respiratory distress.      Breath sounds: Normal breath sounds.   Musculoskeletal:      Comments: No rash or erythema. No ecchymosis or swelling. No muscle spasm. Pain in the thoracic and lumbar region.    Skin:     Findings: No rash.   Neurological:      General: No focal deficit present.      Mental Status: She is alert and oriented to person, place, and time.   Psychiatric:         Mood and Affect: Mood normal.         Behavior: Behavior normal.            Assessment/Plan:   1. Low back pain  - HCG, SERUM QUALITATIVE, PREGNANCY; Future  - OUTSIDE CONSULT/REFERRAL PROVIDER(AMB)    2. Thoracic back pain  - HCG, SERUM QUALITATIVE, PREGNANCY; Future  - OUTSIDE CONSULT/REFERRAL PROVIDER(AMB)    3. Spotting  - HCG, SERUM QUALITATIVE, PREGNANCY; Future    4. Gastroesophageal reflux disease, unspecified whether esophagitis present  - famotidine (PEPCID) 20 mg Oral Tablet; Take 1 Tablet (20 mg total) by mouth Twice daily  Dispense: 180 Tablet; Refill: 0    5. Nausea  - HCG, SERUM QUALITATIVE, PREGNANCY; Future  - famotidine (PEPCID) 20 mg Oral Tablet; Take 1 Tablet (20 mg total) by mouth Twice daily  Dispense: 180 Tablet; Refill: 0  - ondansetron (ZOFRAN) 4 mg Oral Tablet; Take 1 Tablet (4 mg total) by mouth Every 8 hours as needed for Nausea/Vomiting  Dispense: 15 Tablet; Refill: 0    6. Vitamin D insufficiency  - cholecalciferol, vitamin D3, 25 mcg (1,000 unit) Oral Tablet; Take 2 Tablets (2,000 Units total) by mouth Once a day  Dispense: 90 Tablet; Refill: 3      Patient presents to the office today for acute visit for concern of  back pain that has been going on for the past 1 month. She states she had xray completed by  chiropractor but no results available to review. She is concerned about possible pregnancy and will get serum HCG. Will hold off on imaging until pregnancy testing comes back. She is willing to have physical therapy completed and will order this for patient. Patient sent refill of Pepcid to help with nausea and GERD. She is aware to call the office or report to the ER with any new or worsening symptoms including worsening pain, loss of bowel/bladder function. She is to follow up in 6 weeks or sooner if needed.     The supervising/collaborating physician on site for this visit was Dr. Cyndi Lennert.        Shea Evans, PA-C  01/21/2020

## 2020-01-21 NOTE — Nursing Note (Signed)
Patient is here for back pain. She has had the pain for the last month. Patient states she would also like to have a pregnancy test. She states she has been spotting for last 6 days, nauseous in the mornings, and tender breast. She states she has taken 2 home test that were negative. She was told by her OB/GYN to have her PCP order blood work.

## 2020-01-24 ENCOUNTER — Other Ambulatory Visit: Payer: Self-pay

## 2020-01-24 ENCOUNTER — Ambulatory Visit (INDEPENDENT_AMBULATORY_CARE_PROVIDER_SITE_OTHER): Payer: Self-pay | Admitting: Family Medicine

## 2020-01-24 ENCOUNTER — Ambulatory Visit: Payer: No Typology Code available for payment source | Attending: PHYSICIAN ASSISTANT

## 2020-01-24 DIAGNOSIS — R110 Nausea: Secondary | ICD-10-CM | POA: Insufficient documentation

## 2020-01-24 DIAGNOSIS — N920 Excessive and frequent menstruation with regular cycle: Secondary | ICD-10-CM | POA: Insufficient documentation

## 2020-01-24 DIAGNOSIS — M545 Low back pain, unspecified: Secondary | ICD-10-CM | POA: Insufficient documentation

## 2020-01-24 LAB — HCG, SERUM QUALITATIVE, PREGNANCY: PREGNANCY, SERUM QUALITATIVE: NEGATIVE

## 2020-01-24 NOTE — Telephone Encounter (Signed)
-----   Message from Donald Pore Avon sent at 01/23/2020  4:29 PM EDT -----  Peter Minium, MD  Pt is wanting to  speak with the Nurse she wants  to see if she needs to stop seeing the chiropractor until she is done with physical therapy . Please call back advise      Thank You Rene Kocher

## 2020-01-24 NOTE — Telephone Encounter (Signed)
Patient aware to check with PT, she goes Monday

## 2020-02-03 ENCOUNTER — Other Ambulatory Visit: Payer: Self-pay

## 2020-02-03 ENCOUNTER — Ambulatory Visit: Payer: No Typology Code available for payment source | Attending: Family

## 2020-02-03 ENCOUNTER — Telehealth (INDEPENDENT_AMBULATORY_CARE_PROVIDER_SITE_OTHER): Payer: No Typology Code available for payment source | Admitting: Family

## 2020-02-03 DIAGNOSIS — R059 Cough, unspecified: Secondary | ICD-10-CM

## 2020-02-03 DIAGNOSIS — J029 Acute pharyngitis, unspecified: Secondary | ICD-10-CM | POA: Insufficient documentation

## 2020-02-03 DIAGNOSIS — Z20822 Contact with and (suspected) exposure to covid-19: Secondary | ICD-10-CM | POA: Insufficient documentation

## 2020-02-03 LAB — RAPID THROAT SCREEN, STREPTOCOCCUS, WITH REFLEX: THROAT RAPID SCREEN, STREPTOCOCCUS: NEGATIVE

## 2020-02-03 MED ORDER — BENZONATATE 100 MG CAPSULE
100.0000 mg | ORAL_CAPSULE | Freq: Three times a day (TID) | ORAL | 0 refills | Status: DC | PRN
Start: 2020-02-03 — End: 2020-04-03

## 2020-02-03 NOTE — Progress Notes (Signed)
PRIMARY CARE, [facility]  [address] [place] [address]    Video Visit     Name: Traci [name]  MRN: [id number]    Date: 02/03/2020  Age: 19 y.o.                            Patient's location: Home [name] [address]   Patient/family aware of provider location: Yes  Patient/family consent for video visit: Yes  Interview and observation performed by: Paschal Dopp, [facility]      [facility] PRIMARY CARE  PRIMARY CARE, [facility]  [address] [place] [address]  [phone]    Traci [name] is an 19 y.o. female who has a sore throat.      Subjective:  The patient calls in today and reports a sore throat for a couple of days.  She reports that this is worsening.  Reports congestion.  Denies cough, sob or wheezing but reports that her lungs feel full.  Denies any fevers.  Is take OTC cold medicine without relief.      Allergies   Allergen Reactions   . Sulfamethoxazole-Trimethoprim Hives/ Urticaria       Current Outpatient Medications:   .  cholecalciferol, vitamin D3, 25 mcg (1,000 unit) Oral Tablet, Take 2 Tablets (2,000 Units total) by mouth Once a day, Disp: 90 Tablet, Rfl: 3  .  famotidine (PEPCID) 20 mg Oral Tablet, Take 1 Tablet (20 mg total) by mouth Twice daily, Disp: 180 Tablet, Rfl: 0  .  hydrOXYzine pamoate (VISTARIL) 25 mg Oral Capsule, Take 1 Capsule (25 mg total) by mouth Three times a day as needed for Itching or Anxiety, Disp: 30 Capsule, Rfl: 1  .  methylprednisolone acetate (DEPO-MEDROL INJ), by Injection route, Disp: , Rfl:   .  ondansetron (ZOFRAN) 4 mg Oral Tablet, Take 1 Tablet (4 mg total) by mouth Every 8 hours as needed for Nausea/Vomiting, Disp: 15 Tablet, Rfl: 0  Past Medical History:   Diagnosis Date   . Anxiety state    . Endometriosis    . Foot fracture     Right   . Gastroesophageal reflux disease 02/19/2014   . HA (headache)    . Hashimoto's thyroiditis    . Hemorrhage of right ovary 02/19/2015   . Hypothyroidism    .  Obesity    . Vitamin D deficiency          Past Surgical History:   Procedure Laterality Date   . FOOT SURGERY Right    . GALLBLADDER SURGERY      Cholecystectomy   . HX OTHER  10/30/2015    DIAG LAPARO SEPARATE PROC - fulgration of endometriotic implants   . HX OTHER      ORIF Right foot   . HX TONSIL AND ADENOIDECTOMY     . LAPAROSCOPIC ENDOMETRIOSIS FULGURATION     . LAPAROSCOPIC ENDOMETRIOSIS FULGURATION      2nd surgery was to also drain a blood clot.         Social History     Socioeconomic History   . Marital status: Single     Spouse name: Not on file   . Number of children: Not on file   . Years of education: Not on file   . Highest education level: Not on file   Occupational History   . Not on file   Tobacco  Use   . Smoking status: Never Smoker   . Smokeless tobacco: Never Used   Vaping Use   . Vaping Use: Every day   Substance and Sexual Activity   . Alcohol use: Yes   . Drug use: Yes     Types: Marijuana   . Sexual activity: Yes     Birth control/protection: Condom   Other Topics Concern   . Not on file   Social History Narrative   . Not on file     Social Determinants of Health     Financial Resource Strain:    . Difficulty of Paying Living Expenses:    Food Insecurity:    . Worried About Programme researcher, broadcasting/film/video in the Last Year:    . Barista in the Last Year:    Transportation Needs:    . Freight forwarder (Medical):    Marland Kitchen Lack of Transportation (Non-Medical):    Physical Activity:    . Days of Exercise per Week:    . Minutes of Exercise per Session:    Stress:    . Feeling of Stress :    Intimate Partner Violence:    . Fear of Current or Ex-Partner:    . Emotionally Abused:    Marland Kitchen Physically Abused:    . Sexually Abused:          ROS:  Constitutional: Denies any fever or chills.   ENT: + for sore throat and congestion. Marland Kitchen   Respiratory: Denies any Cough or sob reports chest feels full  Cardiovascular: Denies any Chest pain  palpitations or edema.   Gastrointestinal: Denies any abd pain,  N/V/D  Neurologic: Denies any memory loss or confusion  Integumentary : Denies any rashes           Objective:    Physical Exam:    General: Patient is alert and oriented and in no distress   Head: normocephalic, atraumatic  Respiratory: no respiratory  Distress noted.    Neurologic: Alert and oriented,  Skin: No rashes noted   Psychiatric: Patient is cooperative and in no acute distress.      Assessment and Plan:  (J02.9) Sore throat  (primary encounter diagnosis)  Plan: RAPID THROAT SCREEN, STREPTOCOCCUS, WITH         REFLEX, GROUP A STREP CULTURE (UTN ONLY),         COVID-19 SCREENINIG - OUTPATIENT AND DRIVE UP         TESTING        Will send patient for COVID swab and strep throat swab.  I have advised self quarantine until we have results.  As advise symptomatic treatment such as rest, plenty of fluids hot tea with honey, warm saltwater gargles and a cool mist humidifier.  Further recommendations based on results of testing.    (R05.9) Cough  Plan: RAPID THROAT SCREEN, STREPTOCOCCUS, WITH         REFLEX, GROUP A STREP CULTURE (UTN ONLY),         COVID-19 SCREENINIG - OUTPATIENT AND DRIVE UP         TESTING        As above.  Will send Tessalon Perles for her cough.    Orders Placed This Encounter   . RAPID THROAT SCREEN, STREPTOCOCCUS, WITH REFLEX   . GROUP A STREP CULTURE (UTN ONLY)   . COVID-19 SCREENINIG - OUTPATIENT AND DRIVE UP TESTING     The patient was instructed to call office  or go to the ER with any new or worsening symptoms.          Paschal Dopp, CRNP

## 2020-02-04 ENCOUNTER — Telehealth (INDEPENDENT_AMBULATORY_CARE_PROVIDER_SITE_OTHER): Payer: Self-pay | Admitting: Family

## 2020-02-04 ENCOUNTER — Ambulatory Visit (INDEPENDENT_AMBULATORY_CARE_PROVIDER_SITE_OTHER): Payer: Self-pay | Admitting: Family

## 2020-02-04 LAB — COVID-19 SCREENING - SEND-OUT: SARS-COV-2 RNA: NOT DETECTED

## 2020-02-04 LAB — GROUP A STREP CULTURE (UTN ONLY)

## 2020-02-04 MED ORDER — FLUTICASONE PROPIONATE 50 MCG/ACTUATION NASAL SPRAY,SUSPENSION
1.0000 | Freq: Every day | NASAL | 1 refills | Status: DC
Start: 2020-02-04 — End: 2020-04-26

## 2020-02-04 MED ORDER — LORATADINE 10 MG TABLET
10.0000 mg | ORAL_TABLET | Freq: Every day | ORAL | 0 refills | Status: DC
Start: 2020-02-04 — End: 2020-04-03

## 2020-02-04 NOTE — Telephone Encounter (Signed)
Just did a video visit with this patient yesterday.  She is negative for rapid strep and covid.  Strep culture is pending.  Illness likely viral in nature. Symptomatic care recommended.  No need for appt today.

## 2020-02-04 NOTE — Telephone Encounter (Signed)
Covid and rapid strep negative.  May return to work and school today.

## 2020-02-04 NOTE — Telephone Encounter (Signed)
Called Traci [name] left a message. Traci [name] just had a video visit yesterday per Kenney Houseman she is not changing anything and will not be giving a Dr Arlina Robes. If the Traci [name] still wants to come in that is fine but just wanted to make patient aware.

## 2020-02-04 NOTE — Telephone Encounter (Signed)
Appt cancelled. Education provided to patient. She is asking if there is anything OTC she can take for congestion. Please advise

## 2020-02-04 NOTE — Telephone Encounter (Signed)
Left vm waiting on call back

## 2020-02-04 NOTE — Telephone Encounter (Signed)
Script sent. Thanks.

## 2020-02-04 NOTE — Telephone Encounter (Signed)
aware

## 2020-02-04 NOTE — Telephone Encounter (Signed)
Patient is aware and verbally understands.

## 2020-02-10 ENCOUNTER — Ambulatory Visit (INDEPENDENT_AMBULATORY_CARE_PROVIDER_SITE_OTHER): Payer: No Typology Code available for payment source | Admitting: PHYSICIAN ASSISTANT

## 2020-02-10 ENCOUNTER — Other Ambulatory Visit: Payer: No Typology Code available for payment source | Attending: PHYSICIAN ASSISTANT | Admitting: PHYSICIAN ASSISTANT

## 2020-02-10 ENCOUNTER — Encounter (INDEPENDENT_AMBULATORY_CARE_PROVIDER_SITE_OTHER): Payer: Self-pay | Admitting: PHYSICIAN ASSISTANT

## 2020-02-10 ENCOUNTER — Other Ambulatory Visit: Payer: Self-pay

## 2020-02-10 VITALS — BP 130/88 | HR 93 | Temp 97.5°F | Resp 18 | Ht 62.0 in | Wt 179.0 lb

## 2020-02-10 DIAGNOSIS — J029 Acute pharyngitis, unspecified: Secondary | ICD-10-CM

## 2020-02-10 DIAGNOSIS — R059 Cough, unspecified: Secondary | ICD-10-CM

## 2020-02-10 DIAGNOSIS — R0989 Other specified symptoms and signs involving the circulatory and respiratory systems: Secondary | ICD-10-CM

## 2020-02-10 DIAGNOSIS — R5383 Other fatigue: Secondary | ICD-10-CM

## 2020-02-10 LAB — POCT RAPID STREP A

## 2020-02-10 MED ORDER — AZITHROMYCIN 250 MG TABLET
ORAL_TABLET | ORAL | 0 refills | Status: DC
Start: 2020-02-10 — End: 2020-04-03

## 2020-02-10 NOTE — Nursing Note (Signed)
Pt is here for a sore throat, fever, body ache X 12 days       Pt did not get covid vaccine

## 2020-02-10 NOTE — Progress Notes (Signed)
PRIMARY CARE, [facility]  [address] [place] [address]       Name: Traci [name] MRN:  [id number]   Date: 02/10/2020 Age: 19 y.o.        Traci [name] is a 19 y.o. female who presents to the office today for   Chief Complaint   Patient presents with    Sore Throat    Other     body ache     Shortness of Breath       Subjective:   Traci [name] is a 19 y.o. female who presents to the office today for concerns of continued sick symptoms. Patient reports having severe ore throat. She says her lymph nodes were swollen this morning as well. She was seen in the office a few days ago and had negative strep and covid testing. Patient reports symptoms continue to worsen. She says she had fever 100.1 yesterday. She reports history of frequent strep as a child. She has been taking tylenol and chloraseptic spray at home without relief. She denies any other sick symptoms. Denies sick contact or exposure to anyone with COVID 19.     Review of Systems   Constitutional: Negative for chills and fever.   HENT: Positive for sore throat. Negative for congestion and ear pain.    Respiratory: Negative for cough and shortness of breath.    Cardiovascular: Negative for chest pain.   Gastrointestinal: Negative for nausea and vomiting.   Neurological: Negative for headaches.        Past Medical History:   Diagnosis Date    Anxiety state     Endometriosis     Foot fracture     Right    Gastroesophageal reflux disease 02/19/2014    HA (headache)     Hashimoto's thyroiditis     Hemorrhage of right ovary 02/19/2015    Hypothyroidism     Obesity     Vitamin D deficiency             Current Outpatient Medications   Medication Sig    benzonatate (TESSALON) 100 mg Oral Capsule Take 1 Capsule (100 mg total) by mouth Three times a day as needed for Cough    cholecalciferol, vitamin D3, 25 mcg (1,000 unit) Oral Tablet Take 2 Tablets (2,000 Units total) by mouth Once a day    famotidine (PEPCID) 20 mg Oral  Tablet Take 1 Tablet (20 mg total) by mouth Twice daily    fluticasone propionate (FLONASE) 50 mcg/actuation Nasal Spray, Suspension 1 Spray by Each Nostril route Once a day    hydrOXYzine pamoate (VISTARIL) 25 mg Oral Capsule Take 1 Capsule (25 mg total) by mouth Three times a day as needed for Itching or Anxiety    loratadine (CLARITIN) 10 mg Oral Tablet Take 1 Tablet (10 mg total) by mouth Once a day    methylprednisolone acetate (DEPO-MEDROL INJ) by Injection route    ondansetron (ZOFRAN) 4 mg Oral Tablet Take 1 Tablet (4 mg total) by mouth Every 8 hours as needed for Nausea/Vomiting         Objective:     BP 130/88    Pulse 93    Temp 36.4 C (97.5 F)    Resp 18    Ht 1.575 m (5\' 2" )    Wt 81.2 kg (179 lb)    SpO2 99%    BMI 32.74 kg/m         Physical Exam  Constitutional:       General: She is not in acute distress.     Appearance: Normal appearance. She is normal weight.   HENT:      Head: Normocephalic.      Nose: Nose normal.      Mouth/Throat:      Mouth: Mucous membranes are moist.      Pharynx: Posterior oropharyngeal erythema present. No oropharyngeal exudate.   Eyes:      Conjunctiva/sclera: Conjunctivae normal.   Cardiovascular:      Rate and Rhythm: Normal rate and regular rhythm.      Heart sounds: No murmur heard.     Pulmonary:      Effort: Pulmonary effort is normal. No respiratory distress.      Breath sounds: Normal breath sounds.   Lymphadenopathy:      Cervical: Cervical adenopathy present.   Skin:     Findings: No rash.   Neurological:      General: No focal deficit present.      Mental Status: She is alert and oriented to person, place, and time.   Psychiatric:         Mood and Affect: Mood normal.         Behavior: Behavior normal.            Assessment/Plan:   1. Sore throat  - Mono Test; Future  - POCT RAPID STREP A  - azithromycin (ZITHROMAX) 250 mg Oral Tablet; Take 500 mg (2 tab) on day 1; take 250 mg (1 tab) on days 2-5.  Dispense: 6 Tablet; Refill: 0  - Throat Culture Beta  Hemolytic Strep; Future    2. Fatigue, unspecified type   - Mono Test; Future  - POCT RAPID STREP A  - azithromycin (ZITHROMAX) 250 mg Oral Tablet; Take 500 mg (2 tab) on day 1; take 250 mg (1 tab) on days 2-5.  Dispense: 6 Tablet; Refill: 0  - Throat Culture Beta Hemolytic Strep; Future    3. Cough  - CXR PA & Lat; Future  - azithromycin (ZITHROMAX) 250 mg Oral Tablet; Take 500 mg (2 tab) on day 1; take 250 mg (1 tab) on days 2-5.  Dispense: 6 Tablet; Refill: 0    4. Chest congestion  - CXR PA & Lat; Future      Patient is to call the office with any new or worsening symptoms. Rapid strep negative. She is to have testing as ordered and take medication as prescribed and call if not improving. Follow up for routine care or sooner if needed.     The supervising/collaborating physician on site for this visit was Dr. Cyndi Lennert.      Shea Evans, PA-C  02/10/2020

## 2020-02-11 ENCOUNTER — Ambulatory Visit
Admission: RE | Admit: 2020-02-11 | Discharge: 2020-02-11 | Disposition: A | Discharge status: Home / Self Care | Payer: No Typology Code available for payment source | Source: Ambulatory Visit | Attending: PHYSICIAN ASSISTANT | Admitting: PHYSICIAN ASSISTANT

## 2020-02-11 ENCOUNTER — Ambulatory Visit (HOSPITAL_COMMUNITY): Payer: No Typology Code available for payment source

## 2020-02-11 DIAGNOSIS — J029 Acute pharyngitis, unspecified: Secondary | ICD-10-CM | POA: Insufficient documentation

## 2020-02-11 DIAGNOSIS — R0989 Other specified symptoms and signs involving the circulatory and respiratory systems: Secondary | ICD-10-CM | POA: Insufficient documentation

## 2020-02-11 DIAGNOSIS — R059 Cough, unspecified: Secondary | ICD-10-CM | POA: Insufficient documentation

## 2020-02-12 LAB — THROAT CULTURE, BETA HEMOLYTIC STREPTOCOCCUS: THROAT CULTURE: NORMAL

## 2020-02-13 ENCOUNTER — Other Ambulatory Visit (INDEPENDENT_AMBULATORY_CARE_PROVIDER_SITE_OTHER): Payer: Self-pay | Admitting: PHYSICIAN ASSISTANT

## 2020-02-13 DIAGNOSIS — R0602 Shortness of breath: Secondary | ICD-10-CM

## 2020-02-13 MED ORDER — PREDNISONE 20 MG TABLET
ORAL_TABLET | ORAL | 0 refills | Status: DC
Start: 2020-02-13 — End: 2020-04-03

## 2020-02-13 NOTE — Nursing Note (Signed)
02/13/20 1200   Rapid Strep   Rapid Strep Negative   Throat Culture Sent Yes

## 2020-03-09 ENCOUNTER — Encounter (INDEPENDENT_AMBULATORY_CARE_PROVIDER_SITE_OTHER): Payer: Self-pay | Admitting: PHYSICIAN ASSISTANT

## 2020-03-26 DIAGNOSIS — N926 Irregular menstruation, unspecified: Secondary | ICD-10-CM | POA: Insufficient documentation

## 2020-03-30 ENCOUNTER — Other Ambulatory Visit (HOSPITAL_COMMUNITY): Payer: Self-pay

## 2020-03-30 DIAGNOSIS — R58 Hemorrhage, not elsewhere classified: Secondary | ICD-10-CM

## 2020-04-02 ENCOUNTER — Other Ambulatory Visit (HOSPITAL_COMMUNITY): Payer: Self-pay | Admitting: Ultrasound

## 2020-04-03 ENCOUNTER — Ambulatory Visit: Payer: No Typology Code available for payment source | Attending: Internal Medicine

## 2020-04-03 ENCOUNTER — Other Ambulatory Visit: Payer: Self-pay

## 2020-04-03 ENCOUNTER — Encounter (INDEPENDENT_AMBULATORY_CARE_PROVIDER_SITE_OTHER): Payer: Self-pay | Admitting: Internal Medicine

## 2020-04-03 ENCOUNTER — Ambulatory Visit (INDEPENDENT_AMBULATORY_CARE_PROVIDER_SITE_OTHER): Payer: No Typology Code available for payment source | Admitting: Internal Medicine

## 2020-04-03 VITALS — BP 124/84 | HR 83 | Temp 97.4°F | Ht 62.0 in | Wt 175.4 lb

## 2020-04-03 DIAGNOSIS — R059 Cough, unspecified: Secondary | ICD-10-CM

## 2020-04-03 DIAGNOSIS — R52 Pain, unspecified: Secondary | ICD-10-CM

## 2020-04-03 DIAGNOSIS — Z20822 Contact with and (suspected) exposure to covid-19: Secondary | ICD-10-CM | POA: Insufficient documentation

## 2020-04-03 DIAGNOSIS — R519 Headache, unspecified: Secondary | ICD-10-CM

## 2020-04-03 DIAGNOSIS — R0602 Shortness of breath: Secondary | ICD-10-CM | POA: Insufficient documentation

## 2020-04-03 DIAGNOSIS — J029 Acute pharyngitis, unspecified: Secondary | ICD-10-CM

## 2020-04-03 LAB — RAPID INFLUENZA A/B ANTIGEN
INFLUENZA A ANTIGEN: NEGATIVE
INFLUENZA B ANTIGEN: NEGATIVE

## 2020-04-03 MED ORDER — DOXYCYCLINE HYCLATE 100 MG CAPSULE
100.0000 mg | ORAL_CAPSULE | Freq: Two times a day (BID) | ORAL | 0 refills | Status: AC
Start: 2020-04-03 — End: 2020-04-13

## 2020-04-03 MED ORDER — PROMETHAZINE-DM 6.25 MG-15 MG/5 ML ORAL SYRUP
5.0000 mL | ORAL_SOLUTION | Freq: Four times a day (QID) | ORAL | 0 refills | Status: DC | PRN
Start: 2020-04-03 — End: 2020-05-01

## 2020-04-03 NOTE — Progress Notes (Signed)
PRIMARY CARE, [facility]  [address]  [place] [address]    Chief Complaint:    Chief Complaint   Patient presents with   . Eye Swelling   . Cough   . Sore Throat   . Fatigue   . Headache   . Generalized Body Aches   . Fever   . Runny Nose   . Shortness of Breath   . Exposure To Coronavirus     HPI:  Patient presents for an acute visit.  Symptoms started on Monday of this week.  She has tried Production designer, theatre/television/film and NyQuil at home.  Complains of nasal congestion as well as runny nose.  Initially stated to the medical assistant that she had fever but she denies that with the examiner.  She has had some generalized body aches hip.  Complains of sore throat and headache.  Cough occurring intermittently which does seem productive at times.  No ear pain.  Exposure to 2 family members with COVID about 3 weeks ago.  She reports that her 2 brothers had COVID infection.  She complains of nausea but no vomiting.  No diarrhea.  Denies any chance of pregnancy.  She feels tired and fatigued.  He she does note some intermittent shortness of breath when going up steps.  She has not noticed any wheezing.  She has not received the COVID vaccine.  She thought her eyes seemed puffy.    Past Medical History:   Diagnosis Date   . Anxiety state    . Endometriosis    . Foot fracture     Right   . Gastroesophageal reflux disease 02/19/2014   . HA (headache)    . Hashimoto's thyroiditis    . Hemorrhage of right ovary 02/19/2015   . Hypothyroidism    . Obesity    . Vitamin D deficiency      Allergies   Allergen Reactions   . Sulfamethoxazole-Trimethoprim Hives/ Urticaria     Current Outpatient Medications   Medication Sig   . cholecalciferol, vitamin D3, 25 mcg (1,000 unit) Oral Tablet Take 2 Tablets (2,000 Units total) by mouth Once a day   . famotidine (PEPCID) 20 mg Oral Tablet Take 1 Tablet (20 mg total) by mouth Twice daily   . fluticasone propionate (FLONASE) 50 mcg/actuation Nasal Spray, Suspension 1 Spray by Each  Nostril route Once a day (Patient not taking: Reported on 04/03/2020 )   . hydrOXYzine pamoate (VISTARIL) 25 mg Oral Capsule Take 1 Capsule (25 mg total) by mouth Three times a day as needed for Itching or Anxiety   . methylprednisolone acetate (DEPO-MEDROL INJ) by Injection route   . ondansetron (ZOFRAN) 4 mg Oral Tablet Take 1 Tablet (4 mg total) by mouth Every 8 hours as needed for Nausea/Vomiting   . tranexamic acid (LYSTEDA) 650 mg Oral Tablet      ROS:  Constitutional:  No fever or chills.  HENT:  As noted above.  Eyes:  No eye drainage or redness.  Respiratory:  As noted above.  GI:  No abdominal pain.  As noted above.    Physical Exam:  BP 124/84 (Site: Left, Patient Position: Sitting, Cuff Size: Adult Large)   Pulse 83   Temp 36.3 C (97.4 F) (Tympanic)   Ht 1.575 m (5\' 2" )   Wt 79.5 kg (175 lb 6 oz)   SpO2 98%   BMI 32.08 kg/m     Alert and ill-appearing but nontoxic.  HEENT:  Pupils  reactive without icterus.  No eye drainage.  TMs without erythema.  Nares patent with boggy mucosa and mild clear rhinorrhea.  Moist mucous membranes without erythema or exudate.  Neck:  Supple, nontender without adenopathy or masses.  Lungs:  Mildly coarse breath sounds bilaterally with no wheezing rales rhonchi.  No wheezing on forced expiration.  Intermittent cough noted during visit.  No tachypnea.  Cardiac:  Regular rate and rhythm.  No murmur, gallop or rub.  No ectopic beats.  Abdomen:  Soft, nontender, non-distended with normoactive bowel sounds.  No appreciable organomegaly.  Extremities:  Warm with no cyanosis or edema.  No erythema.  Capillary refill less than 3 seconds.  Normal skin turgor.  Skin:  No rash or jaundice.      ICD-10-CM    1. Cough  R05.9 COVID-19 SCREENING - OUTPATIENT AND DRIVE UP TESTING     promethazine-dextromethorphan (PHENERGAN-DM) 6.25-15 mg/5 mL Oral Syrup     CXR PA & Lat     doxycycline hyclate (VIBRAMYCIN) 100 mg Oral Capsule     Rapid Influenza A/B Antigen   2. Headache  R51.9  COVID-19 SCREENING - OUTPATIENT AND DRIVE UP TESTING   3. Pharyngitis, unspecified etiology  J02.9 COVID-19 SCREENING - OUTPATIENT AND DRIVE UP TESTING   4. Body aches  R52 COVID-19 SCREENING - OUTPATIENT AND DRIVE UP TESTING     Rapid Influenza A/B Antigen   5. Shortness of breath  R06.02 COVID-19 SCREENING - OUTPATIENT AND DRIVE UP TESTING     CXR PA & Lat     Rapid Influenza A/B Antigen   6. Close exposure to COVID-19 virus  Z20.822 COVID-19 SCREENING - OUTPATIENT AND DRIVE UP TESTING     Will treat with doxycycline 100 mg twice a day for 10 days given symptoms.  Will use Phenergan DM for cough.  Given symptoms and exposure, will check COVID test as well as influenza.  Check chest x-ray given complaints of shortness of breath.  Advised to quarantine at home pending test results.  May use Tylenol as needed for headache or fever.  May use warm saltwater gargles at home for supportive care.  Anticipated course discussed with patient.  Advised to proceed to ED if she has high fevers, respiratory distress or cyanosis.  Follow-up as needed.    Bufford Buttner, MD    This dictation was transcribed using voice recognition software and may contain minor typographical errors.

## 2020-04-03 NOTE — Nursing Note (Signed)
Pt is c/o cough,eyes swelling,sore throat, fatigue, headache since Monday . Pt states her symptoms have gotten worse and feels like her lungs are being smashed.

## 2020-04-04 LAB — COVID-19 SCREENING - SEND-OUT: SARS-COV-2 RNA: NOT DETECTED

## 2020-04-07 ENCOUNTER — Ambulatory Visit
Admission: RE | Admit: 2020-04-07 | Discharge: 2020-04-07 | Disposition: A | Discharge status: Home / Self Care | Payer: No Typology Code available for payment source | Source: Ambulatory Visit | Attending: Internal Medicine | Admitting: Internal Medicine

## 2020-04-07 ENCOUNTER — Other Ambulatory Visit: Payer: Self-pay

## 2020-04-07 DIAGNOSIS — R0602 Shortness of breath: Secondary | ICD-10-CM | POA: Insufficient documentation

## 2020-04-07 DIAGNOSIS — R059 Cough, unspecified: Secondary | ICD-10-CM | POA: Insufficient documentation

## 2020-04-09 ENCOUNTER — Other Ambulatory Visit (INDEPENDENT_AMBULATORY_CARE_PROVIDER_SITE_OTHER): Payer: Self-pay | Admitting: Internal Medicine

## 2020-04-09 DIAGNOSIS — R059 Cough, unspecified: Secondary | ICD-10-CM

## 2020-04-09 DIAGNOSIS — R0602 Shortness of breath: Secondary | ICD-10-CM

## 2020-04-09 DIAGNOSIS — J029 Acute pharyngitis, unspecified: Secondary | ICD-10-CM

## 2020-04-09 DIAGNOSIS — R519 Headache, unspecified: Secondary | ICD-10-CM

## 2020-04-09 DIAGNOSIS — R52 Pain, unspecified: Secondary | ICD-10-CM

## 2020-04-13 ENCOUNTER — Ambulatory Visit
Admission: RE | Admit: 2020-04-13 | Discharge: 2020-04-13 | Disposition: A | Discharge status: Home / Self Care | Payer: No Typology Code available for payment source | Source: Ambulatory Visit

## 2020-04-13 ENCOUNTER — Other Ambulatory Visit: Payer: Self-pay

## 2020-04-13 DIAGNOSIS — R58 Hemorrhage, not elsewhere classified: Secondary | ICD-10-CM | POA: Insufficient documentation

## 2020-04-26 ENCOUNTER — Emergency Department (HOSPITAL_COMMUNITY): Payer: No Typology Code available for payment source

## 2020-04-26 ENCOUNTER — Other Ambulatory Visit: Payer: Self-pay

## 2020-04-26 ENCOUNTER — Emergency Department
Admission: EM | Admit: 2020-04-26 | Discharge: 2020-04-26 | Disposition: A | Discharge status: Home / Self Care | Payer: No Typology Code available for payment source | Attending: Medical | Admitting: Medical

## 2020-04-26 DIAGNOSIS — Z87440 Personal history of urinary (tract) infections: Secondary | ICD-10-CM | POA: Insufficient documentation

## 2020-04-26 DIAGNOSIS — M545 Low back pain, unspecified: Secondary | ICD-10-CM | POA: Insufficient documentation

## 2020-04-26 DIAGNOSIS — R110 Nausea: Secondary | ICD-10-CM | POA: Insufficient documentation

## 2020-04-26 DIAGNOSIS — R509 Fever, unspecified: Secondary | ICD-10-CM | POA: Insufficient documentation

## 2020-04-26 DIAGNOSIS — Z9049 Acquired absence of other specified parts of digestive tract: Secondary | ICD-10-CM | POA: Insufficient documentation

## 2020-04-26 DIAGNOSIS — Z881 Allergy status to other antibiotic agents status: Secondary | ICD-10-CM

## 2020-04-26 DIAGNOSIS — R519 Headache, unspecified: Secondary | ICD-10-CM | POA: Insufficient documentation

## 2020-04-26 DIAGNOSIS — N390 Urinary tract infection, site not specified: Secondary | ICD-10-CM

## 2020-04-26 DIAGNOSIS — R109 Unspecified abdominal pain: Secondary | ICD-10-CM

## 2020-04-26 LAB — COMPREHENSIVE METABOLIC PANEL, NON-FASTING
ALBUMIN/GLOBULIN RATIO: 1.9 (ref >=1.0)
ALBUMIN: 4.5 g/dL (ref 3.5–5.2)
ALKALINE PHOSPHATASE: 53 U/L (ref 41–133)
ALT (SGPT): 13 U/L (ref 0–55)
ANION GAP: 9 mmol/L (ref 6–15)
AST (SGOT): 14 U/L (ref 5–34)
BILIRUBIN TOTAL: 0.3 mg/dL (ref 0.2–1.2)
BUN: 7 mg/dL (ref 7–21)
CALCIUM: 8.8 mg/dL (ref 8.0–10.6)
CHLORIDE: 108 mmol/L — ABNORMAL HIGH (ref 98–107)
CO2 TOTAL: 22 mmol/L (ref 21–32)
CREATININE: 0.69 mg/dL — ABNORMAL LOW (ref 0.80–1.60)
ESTIMATED GFR: >60 mL/min/{1.73_m2}
GLUCOSE: 96 mg/dL (ref 70–100)
POTASSIUM: 4 mmol/L (ref 3.3–5.1)
PROTEIN TOTAL: 6.9 g/dL (ref 6.4–8.3)
SODIUM: 139 mmol/L (ref 136–146)

## 2020-04-26 LAB — CBC WITH DIFF
BASOPHIL #: <0.1 10*3/uL (ref <=0.20)
BASOPHIL %: 0 %
EOSINOPHIL #: <0.1 10*3/uL (ref <=0.50)
EOSINOPHIL %: 1 %
HCT: 38.6 % (ref 34.8–46.0)
HGB: 12.9 g/dL (ref 11.5–16.0)
IMMATURE GRANULOCYTE #: <0.1 10*3/uL (ref <0.10)
IMMATURE GRANULOCYTE %: 1 % (ref 0–1)
LYMPHOCYTE #: 0.54 10*3/uL — ABNORMAL LOW (ref 1.00–4.80)
LYMPHOCYTE %: 10 %
MCH: 27.4 pg (ref 26.0–32.0)
MCHC: 33.4 g/dL (ref 31.0–35.5)
MCV: 82.1 fL (ref 78.0–100.0)
MONOCYTE #: 0.43 10*3/uL (ref 0.20–1.10)
MONOCYTE %: 8 %
MPV: 12 fL (ref 8.7–12.5)
NEUTROPHIL #: 4.53 10*3/uL (ref 1.50–7.70)
NEUTROPHIL %: 80 %
PLATELETS: 204 10*3/uL (ref 150–400)
RBC: 4.7 10*6/uL (ref 3.85–5.22)
RDW-CV: 14.2 % (ref 11.5–15.5)
WBC: 5.6 10*3/uL (ref 3.7–11.0)

## 2020-04-26 LAB — URINALYSIS, MICROSCOPIC

## 2020-04-26 LAB — HCG, URINE QUALITATIVE, PREGNANCY
HCG URINE QUALITATIVE: NEGATIVE
SPECIFIC GRAVITY: 1.024 (ref 1.000–1.030)

## 2020-04-26 LAB — LACTIC ACID LEVEL W/ REFLEX FOR LEVEL >2.0: LACTIC ACID: 1.5 mmol/L (ref 0.5–2.0)

## 2020-04-26 LAB — URINALYSIS, MACRO/MICRO
BILIRUBIN: NEGATIVE mg/dL
BLOOD: NEGATIVE mg/dL
GLUCOSE: NEGATIVE mg/dL
KETONES: NEGATIVE mg/dL
LEUKOCYTES: NEGATIVE WBCs/uL
NITRITE: NEGATIVE
PH: 5.5 (ref 5.0–9.0)
PROTEIN: NEGATIVE mg/dL
SPECIFIC GRAVITY: 1.027 (ref 1.001–1.030)
UROBILINOGEN: 0.2 mg/dL (ref 0.2–1.0)

## 2020-04-26 LAB — BLUE TOP TUBE

## 2020-04-26 LAB — LIPASE: LIPASE: 69 U/L (ref 8–78)

## 2020-04-26 LAB — RED TOP TUBE

## 2020-04-26 LAB — LAVENDER TOP TUBE

## 2020-04-26 MED ORDER — PHENAZOPYRIDINE 200 MG TABLET
200.0000 mg | ORAL_TABLET | Freq: Three times a day (TID) | ORAL | 0 refills | Status: AC
Start: 2020-04-26 — End: 2020-04-28

## 2020-04-26 MED ORDER — SODIUM CHLORIDE 0.9 % IV BOLUS
1000.0000 mL | INJECTION | Status: AC
Start: 2020-04-26 — End: 2020-04-26
  Administered 2020-04-26: 1000 mL via INTRAVENOUS
  Administered 2020-04-26: 0 mL via INTRAVENOUS

## 2020-04-26 MED ORDER — ONDANSETRON HCL (PF) 4 MG/2 ML INJECTION SOLUTION
4.0000 mg | INTRAMUSCULAR | Status: AC
Start: 2020-04-26 — End: 2020-04-26
  Administered 2020-04-26: 4 mg via INTRAVENOUS
  Filled 2020-04-26: qty 2

## 2020-04-26 MED ORDER — ONDANSETRON 4 MG DISINTEGRATING TABLET
4.0000 mg | ORAL_TABLET | Freq: Three times a day (TID) | ORAL | 0 refills | Status: DC | PRN
Start: 2020-04-26 — End: 2020-07-21

## 2020-04-26 MED ORDER — IOHEXOL 300 MG IODINE/ML INTRAVENOUS SOLUTION
95.0000 mL | INTRAVENOUS | Status: AC
Start: 2020-04-26 — End: 2020-04-26
  Administered 2020-04-26: 95 mL via INTRAVENOUS

## 2020-04-26 MED ORDER — KETOROLAC 30 MG/ML (1 ML) INJECTION SOLUTION
15.0000 mg | INTRAMUSCULAR | Status: AC
Start: 2020-04-26 — End: 2020-04-26
  Administered 2020-04-26: 15 mg via INTRAVENOUS
  Filled 2020-04-26: qty 1

## 2020-04-26 MED ORDER — HYDROCODONE 5 MG-ACETAMINOPHEN 325 MG TABLET
1.0000 | ORAL_TABLET | Freq: Four times a day (QID) | ORAL | 0 refills | Status: DC | PRN
Start: 2020-04-26 — End: 2020-06-15

## 2020-04-26 MED ORDER — PHENAZOPYRIDINE 200 MG TABLET
200.0000 mg | ORAL_TABLET | ORAL | Status: AC
Start: 2020-04-26 — End: 2020-04-26
  Administered 2020-04-26: 200 mg via ORAL
  Filled 2020-04-26: qty 1

## 2020-04-26 MED ORDER — HYDROCODONE 5 MG-ACETAMINOPHEN 325 MG TABLET
1.0000 | ORAL_TABLET | ORAL | Status: AC
Start: 2020-04-26 — End: 2020-04-26
  Administered 2020-04-26: 1 via ORAL
  Filled 2020-04-26: qty 1

## 2020-04-26 NOTE — Discharge Instructions (Signed)
Urine culture pending 2 days. Please call ob/gyn for follow up. Ct scan today showing no kidney infection or kidney stone. Continue Augmentin as directed

## 2020-04-26 NOTE — Nurses Notes (Signed)
D/c instructions given pt stated understanding. Iv removed from right ac

## 2020-04-26 NOTE — ED Triage Notes (Signed)
Currently on amoxicillin for UTI, lower back pain x 2 days, headache and nausea starting tonight, fever on 101.2 at home took tylenol at 230

## 2020-04-26 NOTE — ED Resident Course Note (Signed)
Transfer of Care    Time: 7am on 04/26/2020    I have assumed the care of Traci [name] from [employer] after discussing her condition and plan of care. I will follow-up on any pending ancillary studies and provide further treatment and management of the patient as deemed necessary until their disposition from the department.      The following are pending upon transfer of care and were reviewed prior to patient disposition: CMP and CT report pending    Plan: Labs and imaging reviewed. Medical Records reviewed.    Lab results:  Results for orders placed or performed during the hospital encounter of 04/26/20 (from the past 24 hour(s))   CBC/DIFF    Narrative    The following orders were created for panel order CBC/DIFF.  Procedure                               Abnormality         Status                     ---------                               -----------         ------                     CBC WITH JWJX[[id number]]                Abnormal            Final result                 Please view results for these tests on the individual orders.   COMPREHENSIVE METABOLIC PANEL, NON-FASTING   Result Value Ref Range    SODIUM 139 136 - 146 mmol/L    POTASSIUM 4.0 3.3 - 5.1 mmol/L    CHLORIDE 108 (H) 98 - 107 mmol/L    CO2 TOTAL 22 21 - 32 mmol/L    ANION GAP 9 6 - 15 mmol/L    BUN 7 7 - 21 mg/dL    CREATININE 0.69 (L) 0.80 - 1.60 mg/dL    ESTIMATED GFR >60 mL/min/1.56m2    ALBUMIN 4.5 3.5 - 5.2 g/dL    CALCIUM 8.8 8.0 - 10.6 mg/dL    GLUCOSE 96 70 - 100 mg/dL    ALKALINE PHOSPHATASE 53 41 - 133 U/L    ALT (SGPT) 13 0 - 55 U/L    AST (SGOT) 14 5 - 34 U/L    BILIRUBIN TOTAL 0.3 0.2 - 1.2 mg/dL    PROTEIN TOTAL 6.9 6.4 - 8.3 g/dL    ALBUMIN/GLOBULIN RATIO 1.9 >=1.0    Narrative    Estimated Glomerular Filtration Rate (eGFR) calculated using the CKD-EPI (2009) equation, intended for patients 155years of age and older. If race and/or gender is not documented or "unknown," there will be no eGFR calculation.   LACTIC ACID LEVEL W/  REFLEX FOR LEVEL >2.0   Result Value Ref Range    LACTIC ACID 1.5 0.5 - 2.0 mmol/L   URINALYSIS WITH MICROSCOPIC REFLEX IF INDICATED    Narrative    The following orders were created for panel order URINALYSIS WITH MICROSCOPIC REFLEX IF INDICATED.  Procedure  Abnormality         Status                     ---------                               -----------         ------                     URINALYSIS, MACRO/MICRO[[id number]]      Abnormal            Final result                 Please view results for these tests on the individual orders.   HCG, URINE QUALITATIVE, PREGNANCY   Result Value Ref Range    HCG URINE QUALITATIVE Negative Negative    SPECIFIC GRAVITY 1.024 1.000 - 1.030    Narrative    Sensitivity of the qualitative pregnancy test is 20 mIU hCG/mL.  If pregnancy is strongly considered, perform quantitative hCG or repeat in 48 hours.   LIPASE   Result Value Ref Range    LIPASE 69 8 - 78 U/L   CBC WITH DIFF   Result Value Ref Range    WBC 5.6 3.7 - 11.0 x10^3/uL    RBC 4.70 3.85 - 5.22 x10^6/uL    HGB 12.9 11.5 - 16.0 g/dL    HCT 38.6 34.8 - 46.0 %    MCV 82.1 78.0 - 100.0 fL    MCH 27.4 26.0 - 32.0 pg    MCHC 33.4 31.0 - 35.5 g/dL    RDW-CV 14.2 11.5 - 15.5 %    PLATELETS 204 150 - 400 x10^3/uL    MPV 12.0 8.7 - 12.5 fL    NEUTROPHIL % 80 %    LYMPHOCYTE % 10 %    MONOCYTE % 8 %    EOSINOPHIL % 1 %    BASOPHIL % 0 %    NEUTROPHIL # 4.53 1.50 - 7.70 x10^3/uL    LYMPHOCYTE # 0.54 (L) 1.00 - 4.80 x10^3/uL    MONOCYTE # 0.43 0.20 - 1.10 x10^3/uL    EOSINOPHIL # <0.10 <=0.50 x10^3/uL    BASOPHIL # <0.10 <=0.20 x10^3/uL    IMMATURE GRANULOCYTE % 1 0 - 1 %    IMMATURE GRANULOCYTE # <0.10 <0.10 x10^3/uL   URINALYSIS, MACRO/MICRO   Result Value Ref Range    COLOR Dark Yellow (A) Yellow    APPEARANCE Cloudy (A) Clear    SPECIFIC GRAVITY 1.027 1.001 - 1.030    PH 5.5 5.0 - 9.0    PROTEIN Negative Negative mg/dL    GLUCOSE Negative Negative mg/dL    KETONES Negative Negative mg/dL     UROBILINOGEN 0.2  0.2 - 1.0 mg/dL    BILIRUBIN Negative Negative mg/dL    BLOOD Negative Negative mg/dL    NITRITE Negative Negative    LEUKOCYTES Negative Negative WBCs/uL   EXTRA TUBES    Narrative    The following orders were created for panel order EXTRA TUBES.  Procedure                               Abnormality         Status                     ---------                               -----------         ------  BLUE TOP PXTG[[id number]]                                    In process                 RED TOP EVOJ[[id number]]                                     In process                 LAVENDER TOP XHBZ[[id number]]                                In process                   Please view results for these tests on the individual orders.   URINALYSIS, MICROSCOPIC   Result Value Ref Range    SQUAMOUS EPITHELIAL Moderate (A) None /hpf    WBCS 5-10 (A) None /hpf    RBCS 3-5 (A) None /hpf    BACTERIA Rare (A) Negative /hpf    HYALINE CASTS 3-5 (A) None /lpf    CALCIUM OXALATE CRYSTALS Many (A) None /hpf    MUCOUS 3+ (A) Negative /hpf       Imaging results:   Results for orders placed or performed during the hospital encounter of 04/26/20 (from the past 72 hour(s))   CT ABDOMEN PELVIS W IV CONTRAST     Status: None    Narrative    INDICATION:  Left upper quadrant and suprapubic pain.  Lower back pain.  Fever.    TECHNIQUE:  CT of the abdomen and pelvis was performed with intravenous contrast.  Omnipaque 300 95 mL was administered intravenously.    Automated mA/kV exposure control was utilized and patient examination was performed in strict accordance with principles of ALARA.    RADIATION AMOUNT:  338.30 mGy-cm.    COMPARISON:  US pelvis12/20/21 and CT AP 08/17/19.    FINDINGS:   Abdomen:  The lung bases are clear.  The liver is normal.  Status post cholecystectomy.  The pancreas, spleen, adrenal glands, and kidneys demonstrate no acute pathology.  No hydronephrosis.      There is no free air or lymph  node enlargement.  Abdominal aorta is not aneurysmal.    Pelvis:  Mild colonic stool burden.  There is no bowel wall thickening or obstruction.  Normal appendix.  There is no free fluid.  Lymph nodes are not enlarged.  Urinary bladder is unremarkable.    Skeleton:    There are no acute fractures.  No suspicious bony lesions.      Impression    No evidence of acute inflammatory process in the abdomen or pelvis.      Signed by Ardelle Anton, MD       ECG:    No results found for this visit on 04/26/20 (from the past 720 hour(s)).      MDM:    During the patient's stay in the emergency department, the above listed imaging and/or labs were performed to assist with medical decision making and were reviewed by myself when available for review.    Patient rechecked and remained stable throughout remainder of emergency department course.    All questions/concerns addressed, and  patient agrees with disposition plan.    Slip given 3 days off work   Urine culture pending. Continue augmentin  ED Course as of Apr 27 755   Sun Apr 26, 2020   0610 WBC 5.6    [KW]   0619 Preg neg    [KW]   4097 HGB: 12.9 [KW]   3532 Pt back from CT report pending.    [KW]   [place] of this patient transferred to [facility], PA-C at the end of my shift to await the CT report.    [KW]   0707 reviewed   COMPREHENSIVE METABOLIC PANEL, NON-FASTING(!) [CK]   9924 Case transferred from [facility]. Waiting on CMP and CT report. Please see kim watt pa-[facility] earlier H&P    [CK]   [id number] Nursing staff stating pt calling out for more pain medication. Advised to hold off until i can review her chart. I did review her past office visit with her ob/gyn on 04/21/2020. Past pelvic floor spasms, dysfunctional uterine bleeding, frequent UTI's and endometriosis. Seen for pain and urinary symptoms. Given antibiotic and procede from there. It does not appear they prescribed any pain medication. CT report now back and will review    [CK]   0743 No acute  pathology   CT ABDOMEN PELVIS W IV CONTRAST [CK]   0749 Pa pdmp site accessed. No current or monthly narcotics    [CK]      ED Course User Index  [CK] Oretha Caprice, PA  [KW] Bryn [place], [place]       Impression:   Encounter Diagnosis   Name Primary?   . Flank pain Yes     Disposition:   Discharged    Follow up:   F/U with your [facility] specialist          Prescriptions:   Current Discharge Medication List      START taking these medications    Details   HYDROcodone-acetaminophen (NORCO) 5-325 mg Oral Tablet Take 1 Tablet by mouth Every 6 hours as needed for Pain  Qty: 12 Tablet, Refills: 0    Comments: Initial therapy for acute abd/flank pain      ondansetron (ZOFRAN ODT) 4 mg Oral Tablet, Rapid Dissolve Take 1 Tablet (4 mg total) by mouth Every 8 hours as needed for Nausea/Vomiting  Qty: 12 Tablet, Refills: 0      phenazopyridine (PYRIDIUM) 200 mg Oral Tablet Take 1 Tablet (200 mg total) by mouth Three times a day for 2 days  Qty: 6 Tablet, Refills: 0             Oretha Caprice, PA  04/26/2020, 07:10

## 2020-04-26 NOTE — ED Provider Notes (Signed)
[facility]       Attending Physician: Gerhard Munch. Athena Masse  CC:  Chief Complaint   Patient presents with   . Low Back Pain   . Nausea   . Headache   . Fever     HPI:  Traci [name] is a 20 y.o. female who presents to the ED via POV with c/o low back pain, fever, nausea tonight.  The patient is currently on Augmentin BID x 5 days for a UTI and has 2 full days left.  Prior to that she was on Macrobid but it didn't cover the organism.    She denies dysuria, hematuria, and pregnancy.     Review of Systems:  Skin: No rash or wound  HENT: No congestion  Eyes: No vision changes or discharge  Cardio: No chest pain or edema  Respiratory: No cough, sob, or dyspnea  MSK: No joint   Neuro: No numbness, tingling or weakness.    All other systems reviewed and are negative or as noted in HPI.    History:  PMH:    Past Medical History:   Diagnosis Date   . Anxiety state    . Endometriosis    . Foot fracture     Right   . Gastroesophageal reflux disease 02/19/2014   . HA (headache)    . Hashimoto's thyroiditis    . Hemorrhage of right ovary 02/19/2015   . Hypothyroidism    . Obesity    . Vitamin D deficiency          Previous Medications    AMOXICILLIN-POT CLAVULANATE (AUGMENTIN) 500-125 MG ORAL TABLET    Take 1 Tablet by mouth Three times a day    CHOLECALCIFEROL, VITAMIN D3, 25 MCG (1,000 UNIT) ORAL TABLET    Take 2 Tablets (2,000 Units total) by mouth Once a day    FAMOTIDINE (PEPCID) 20 MG ORAL TABLET    Take 1 Tablet (20 mg total) by mouth Twice daily    METHYLPREDNISOLONE ACETATE (DEPO-MEDROL INJ)    by Injection route    ONDANSETRON (ZOFRAN) 4 MG ORAL TABLET    Take 1 Tablet (4 mg total) by mouth Every 8 hours as needed for Nausea/Vomiting    PROMETHAZINE-DEXTROMETHORPHAN (PHENERGAN-DM) 6.25-15 MG/5 ML ORAL SYRUP    Take 5 mL by mouth Four times a day as needed for Cough       PSH:    Past Surgical History:   Procedure Laterality Date   . FOOT SURGERY Right    . GALLBLADDER SURGERY      Cholecystectomy   . HX  OTHER  10/30/2015    DIAG LAPARO SEPARATE PROC - fulgration of endometriotic implants   . HX OTHER      ORIF Right foot   . HX TONSIL AND ADENOIDECTOMY     . LAPAROSCOPIC ENDOMETRIOSIS FULGURATION     . LAPAROSCOPIC ENDOMETRIOSIS FULGURATION      2nd surgery was to also drain a blood clot.         Social Hx:    Social History     Socioeconomic History   . Marital status: Single     Spouse name: Not on file   . Number of children: Not on file   . Years of education: Not on file   . Highest education level: Not on file   Occupational History   . Not on file   Tobacco Use   . Smoking status: Never Smoker   . Smokeless  tobacco: Never Used   Vaping Use   . Vaping Use: Every day   Substance and Sexual Activity   . Alcohol use: Yes   . Drug use: Yes     Types: Marijuana   . Sexual activity: Yes     Birth control/protection: Condom   Other Topics Concern   . Not on file   Social History Narrative   . Not on file     Social Determinants of Health     Financial Resource Strain: Not on file   Food Insecurity: Not on file   Transportation Needs: Not on file   Physical Activity: Not on file   Stress: Not on file   Intimate Partner Violence: Not on file     Family Hx:   Family History   Problem Relation Age of Onset   . No Known Problems Mother    . No Known Problems Father      Allergies:   Allergies   Allergen Reactions   . Sulfamethoxazole-Trimethoprim Hives/ Urticaria       Above history reviewed with patient, changes are as documented.    Physical Exam:   Nursing note and vitals reviewed.  ED Triage Vitals [04/26/20 0536]   BP (Non-Invasive) (!) 152/97   Heart Rate (!) 108   Respiratory Rate 18   Temperature 37.9 C (100.2 F)   SpO2 99 %   Weight 67.9 kg (149 lb 11.1 oz)   Height 1.575 m (5\' 2" )       Constitutional: Pt is alert and oriented to person, place, and time and appears well-developed and well-nourished. No acute distress.   HENT: Normocephalic, atraumatic  Mouth/Throat: Oral mucous membranes moist, negative  erythema or exudate.  Tonsils midline and gag reflex present. Oropharynx is clear and without drainage.   Eyes: Conjunctivae clear,extraocular motions intact. Pupils are equal, round, and reactive to light.   Neck: Normal range of motion. Neck supple.   Cardiovascular: Normal rate, regular rhythm, No murmurs/rubs/gallops. Pulmonary/Chest: Effort normal. No respiratory distress. Breath sounds normal, no audible wheezes or crackles noted.  Abdominal: BS in all 4 quadrants. Abdomen is soft,TTP LUQ and supra pubic area, non distended, no masses noted.  No rebound or guarding noted.   Musculoskeletal: Normal range of motion. No deformities noted.  No tenderness to palpation.  Back:  Tenderness along the lumbar area bilaterally, the spine itself is not tender.  Negative gross or palpable deformity noted.  Neurological: No local deficits noted.  CN II-XII intact. Facies symmetric.  Skin: Skin is warm and dry without rash.  Color normal.    Psych:  Patient with normal mood and affect.  Course:   Impression: Pt presenting c/o   Chief Complaint   Patient presents with   . Low Back Pain   . Nausea   . Headache   . Fever       Plan:Will obtain the following labs/imaging and give patient the following medications to alleviate symptoms:  Orders Placed This Encounter   . CT ABDOMEN PELVIS W IV CONTRAST   . CBC/DIFF   . COMPREHENSIVE METABOLIC PANEL, NON-FASTING   . LACTIC ACID LEVEL W/ REFLEX FOR LEVEL >2.0   . URINALYSIS WITH MICROSCOPIC REFLEX IF INDICATED   . HCG, URINE QUALITATIVE, PREGNANCY   . LIPASE   . CBC WITH DIFF   . URINALYSIS, MACRO/MICRO   . EXTRA TUBES   . BLUE TOP TUBE   . RED TOP TUBE   . LAVENDER TOP TUBE   .  URINALYSIS, MICROSCOPIC   . INSERT & MAINTAIN PERIPHERAL IV ACCESS   . ondansetron (ZOFRAN) 2 mg/mL injection   . ketorolac (TORADOL) 30 mg/mL injection   . NS bolus infusion 1,000 mL   . iohexol (OMNIPAQUE 300) infusion     Laboratory Results:  Labs Reviewed   CBC WITH DIFF - Abnormal; Notable for the  following components:       Result Value    LYMPHOCYTE # 0.54 (*)     All other components within normal limits   URINALYSIS, MACRO/MICRO - Abnormal; Notable for the following components:    COLOR Dark Yellow (*)     APPEARANCE Cloudy (*)     All other components within normal limits   URINALYSIS, MICROSCOPIC - Abnormal; Notable for the following components:    SQUAMOUS EPITHELIAL Moderate (*)     WBCS 5-10 (*)     RBCS 3-5 (*)     BACTERIA Rare (*)     HYALINE CASTS 3-5 (*)     CALCIUM OXALATE CRYSTALS Many (*)     MUCOUS 3+ (*)     All other components within normal limits   HCG, URINE QUALITATIVE, PREGNANCY - Normal    Narrative:     Sensitivity of the qualitative pregnancy test is 20 mIU hCG/mL.  If pregnancy is strongly considered, perform quantitative hCG or repeat in 48 hours.   CBC/DIFF    Narrative:     The following orders were created for panel order CBC/DIFF.  Procedure                               Abnormality         Status                     ---------                               -----------         ------                     CBC WITH ALPF[[id number]]                Abnormal            Final result                 Please view results for these tests on the individual orders.   URINALYSIS WITH MICROSCOPIC REFLEX IF INDICATED    Narrative:     The following orders were created for panel order URINALYSIS WITH MICROSCOPIC REFLEX IF INDICATED.  Procedure                               Abnormality         Status                     ---------                               -----------         ------                     URINALYSIS, MACRO/MICRO[[id number]]      Abnormal  Final result                 Please view results for these tests on the individual orders.   COMPREHENSIVE METABOLIC PANEL, NON-FASTING   LACTIC ACID LEVEL W/ REFLEX FOR LEVEL >2.0   LIPASE   EXTRA TUBES    Narrative:     The following orders were created for panel order EXTRA TUBES.  Procedure                               Abnormality          Status                     ---------                               -----------         ------                     Erskine Emery ZOXW[[id number]]                                    In process                 RED TOP JXBJ[[id number]]                                     In process                 LAVENDER TOP HYQM[[id number]]                                In process                   Please view results for these tests on the individual orders.   BLUE TOP TUBE   RED TOP TUBE   LAVENDER TOP TUBE         Radiographical Imaging:        EKG- No results found for this visit on 04/26/20 (from the past 720 hour(s)).      All labs were reviewed. Medical Records reviewed.     MDM/Course:    I will obtain labs and UA, pregnancy.  I ordered 1L NS bolus, Toradol IV, and Zofran IV.  CT ABD/Pel.  DDX to include but limited to:  UTI, Pyelonephrosis, kidney stone, ovarian cyst, MSK pain.    MDM:    During the patient's stay in the emergency department, the above listed imaging and/or labs were performed to assist with medical decision making and were reviewed by myself when available for review.    Patient rechecked and remained stable throughout remainder of emergency department course.    All questions/concerns addressed, and patient agrees with disposition plan.   Visit Vitals  Vitals:    04/26/20 0536   BP: (!) 152/97   Pulse: (!) 108   Resp: 18   Temp: 37.9 C (100.2 F)   SpO2: 99%   Weight: 67.9 kg (149 lb 11.1 oz)   Height: 1.575 m (5\' 2" )   BMI: 27.44  Care of patient transferred to Nelda Bucks, PA-C at this time, following discussion of pertinent history, physical exam, laboratory and imaging findings. Pending studies and plan of care discussed. An attending physician has physically seen the patient.     Clinical Impression: No diagnosis found.  Follow Up: No follow-up provider specified.  Medications Prescribed:   New Prescriptions    No medications on file       The co-signing faculty was physically  present in the emergency department and available for consultation and did not participate in the care of this patient.    Benjiman Core, PA 04/26/2020, 06:52    This chart may have been completed after the conclusion of this patient's care due to the time constraints of simultaneous responsibilities of direct patient care activities during the clinical shift in the emergency department.    This note was partially generated using MModal Fluency Direct system, and there may be some incorrect words, spellings, and punctuation's that were not noted in checking the note before saving.    // Cala Bradford B. Athena Masse 04/26/2020 05:50  Department of Emergency Medicine [facility] MEDICINE

## 2020-04-30 ENCOUNTER — Ambulatory Visit: Payer: No Typology Code available for payment source

## 2020-04-30 ENCOUNTER — Other Ambulatory Visit: Payer: Self-pay

## 2020-04-30 DIAGNOSIS — N3001 Acute cystitis with hematuria: Secondary | ICD-10-CM | POA: Insufficient documentation

## 2020-04-30 LAB — URINALYSIS, MACRO/MICRO
BILIRUBIN: NEGATIVE mg/dL
BLOOD: NEGATIVE mg/dL
GLUCOSE: NEGATIVE mg/dL
KETONES: NEGATIVE mg/dL
LEUKOCYTES: NEGATIVE WBCs/uL
NITRITE: NEGATIVE
PH: 8.5 (ref 5.0–9.0)
PROTEIN: NEGATIVE mg/dL
SPECIFIC GRAVITY: 1.019 (ref 1.001–1.030)
UROBILINOGEN: 0.2 mg/dL (ref 0.2–1.0)

## 2020-04-30 LAB — CBC WITH DIFF
BASOPHIL #: <0.1 10*3/uL (ref <=0.20)
BASOPHIL %: 0 %
EOSINOPHIL #: <0.1 10*3/uL (ref <=0.50)
EOSINOPHIL %: 0 %
HCT: 43.7 % (ref 34.8–46.0)
HGB: 13.9 g/dL (ref 11.5–16.0)
IMMATURE GRANULOCYTE #: <0.1 10*3/uL (ref <0.10)
IMMATURE GRANULOCYTE %: 0 % (ref 0–1)
LYMPHOCYTE #: 1.42 10*3/uL (ref 1.00–4.80)
LYMPHOCYTE %: 24 %
MCH: 26.6 pg (ref 26.0–32.0)
MCHC: 31.8 g/dL (ref 31.0–35.5)
MCV: 83.7 fL (ref 78.0–100.0)
MONOCYTE #: 0.37 10*3/uL (ref 0.20–1.10)
MONOCYTE %: 6 %
MPV: 12 fL (ref 8.7–12.5)
NEUTROPHIL #: 4.04 10*3/uL (ref 1.50–7.70)
NEUTROPHIL %: 70 %
PLATELETS: 229 10*3/uL (ref 150–400)
RBC: 5.22 10*6/uL (ref 3.85–5.22)
RDW-CV: 13.9 % (ref 11.5–15.5)
WBC: 5.9 10*3/uL (ref 3.7–11.0)

## 2020-04-30 LAB — URINALYSIS, MICROSCOPIC

## 2020-05-01 ENCOUNTER — Other Ambulatory Visit: Payer: Self-pay

## 2020-05-01 ENCOUNTER — Encounter (HOSPITAL_COMMUNITY): Payer: Self-pay

## 2020-05-01 ENCOUNTER — Emergency Department
Admission: EM | Admit: 2020-05-01 | Discharge: 2020-05-02 | Disposition: A | Discharge status: Home / Self Care | Payer: No Typology Code available for payment source | Attending: Emergency Medicine | Admitting: Emergency Medicine

## 2020-05-01 DIAGNOSIS — N390 Urinary tract infection, site not specified: Secondary | ICD-10-CM | POA: Insufficient documentation

## 2020-05-01 DIAGNOSIS — N809 Endometriosis, unspecified: Secondary | ICD-10-CM

## 2020-05-01 DIAGNOSIS — K219 Gastro-esophageal reflux disease without esophagitis: Secondary | ICD-10-CM | POA: Insufficient documentation

## 2020-05-01 DIAGNOSIS — R102 Pelvic and perineal pain: Secondary | ICD-10-CM

## 2020-05-01 DIAGNOSIS — Z9049 Acquired absence of other specified parts of digestive tract: Secondary | ICD-10-CM | POA: Insufficient documentation

## 2020-05-01 DIAGNOSIS — M545 Low back pain, unspecified: Secondary | ICD-10-CM | POA: Insufficient documentation

## 2020-05-01 DIAGNOSIS — E063 Autoimmune thyroiditis: Secondary | ICD-10-CM

## 2020-05-01 LAB — HCG, URINE QUALITATIVE, PREGNANCY
HCG URINE QUALITATIVE: NEGATIVE
SPECIFIC GRAVITY: 1.023 (ref 1.000–1.030)

## 2020-05-01 LAB — URINALYSIS, MACRO/MICRO
BILIRUBIN: NEGATIVE mg/dL
GLUCOSE: NEGATIVE mg/dL
KETONES: NEGATIVE mg/dL
NITRITE: NEGATIVE
PH: 5.5 (ref 5.0–9.0)
PROTEIN: NEGATIVE mg/dL
SPECIFIC GRAVITY: 1.025 (ref 1.001–1.030)
UROBILINOGEN: 0.2 mg/dL (ref 0.2–1.0)

## 2020-05-01 LAB — URINALYSIS, MICROSCOPIC

## 2020-05-01 NOTE — ED Triage Notes (Signed)
bilatereal flank pain, headache, chest pain, fever at home x 10 days... patient seen about 10 days ago and diagnosed with UTI.. finished abo without relief and came back 04/26/20... told to come back if doesn't feel better.. pt says she feels worse

## 2020-05-02 LAB — CBC WITH DIFF
BASOPHIL #: <0.1 10*3/uL (ref <=0.20)
BASOPHIL %: 0 %
EOSINOPHIL #: <0.1 10*3/uL (ref <=0.50)
EOSINOPHIL %: 0 %
HCT: 41.6 % (ref 34.8–46.0)
HGB: 13.9 g/dL (ref 11.5–16.0)
IMMATURE GRANULOCYTE #: <0.1 10*3/uL (ref <0.10)
IMMATURE GRANULOCYTE %: 1 % (ref 0–1)
LYMPHOCYTE #: 2.46 10*3/uL (ref 1.00–4.80)
LYMPHOCYTE %: 27 %
MCH: 26.7 pg (ref 26.0–32.0)
MCHC: 33.4 g/dL (ref 31.0–35.5)
MCV: 79.8 fL (ref 78.0–100.0)
MONOCYTE #: 0.47 10*3/uL (ref 0.20–1.10)
MONOCYTE %: 5 %
MPV: 11.5 fL (ref 8.7–12.5)
NEUTROPHIL #: 6.13 10*3/uL (ref 1.50–7.70)
NEUTROPHIL %: 67 %
PLATELETS: 233 10*3/uL (ref 150–400)
RBC: 5.21 10*6/uL (ref 3.85–5.22)
RDW-CV: 13.7 % (ref 11.5–15.5)
WBC: 9.1 10*3/uL (ref 3.7–11.0)

## 2020-05-02 LAB — COMPREHENSIVE METABOLIC PANEL, NON-FASTING
ALBUMIN/GLOBULIN RATIO: 1.5 (ref >=1.0)
ALBUMIN: 4.6 g/dL (ref 3.5–5.2)
ALKALINE PHOSPHATASE: 58 U/L (ref 41–133)
ALT (SGPT): 29 U/L (ref 0–55)
ANION GAP: 12 mmol/L (ref 6–15)
AST (SGOT): 14 U/L (ref 5–34)
BILIRUBIN TOTAL: 0.5 mg/dL (ref 0.2–1.2)
BUN: 8 mg/dL (ref 7–21)
CALCIUM: 9.6 mg/dL (ref 8.0–10.6)
CHLORIDE: 104 mmol/L (ref 98–107)
CO2 TOTAL: 25 mmol/L (ref 21–32)
CREATININE: 0.71 mg/dL — ABNORMAL LOW (ref 0.80–1.60)
ESTIMATED GFR: >60 mL/min/{1.73_m2}
GLUCOSE: 79 mg/dL (ref 70–100)
POTASSIUM: 4.2 mmol/L (ref 3.3–5.1)
PROTEIN TOTAL: 7.6 g/dL (ref 6.4–8.3)
SODIUM: 141 mmol/L (ref 136–146)

## 2020-05-02 LAB — BLUE TOP TUBE

## 2020-05-02 LAB — LIPASE: LIPASE: 24 U/L (ref 8–78)

## 2020-05-02 LAB — RED TOP TUBE

## 2020-05-02 MED ORDER — CEPHALEXIN 500 MG CAPSULE
500.0000 mg | ORAL_CAPSULE | Freq: Once | ORAL | Status: AC
Start: 2020-05-02 — End: 2020-05-02
  Administered 2020-05-02: 500 mg via ORAL
  Filled 2020-05-02: qty 1

## 2020-05-02 MED ORDER — HYDROCODONE 5 MG-ACETAMINOPHEN 325 MG TABLET
2.0000 | ORAL_TABLET | ORAL | Status: AC
Start: 2020-05-02 — End: 2020-05-02
  Administered 2020-05-02: 2 via ORAL
  Filled 2020-05-02: qty 2

## 2020-05-02 MED ORDER — CEPHALEXIN 500 MG CAPSULE
500.0000 mg | ORAL_CAPSULE | Freq: Four times a day (QID) | ORAL | Status: DC
Start: 2020-05-02 — End: 2020-05-02

## 2020-05-02 MED ORDER — NAPROXEN 500 MG TABLET
250.0000 mg | ORAL_TABLET | Freq: Three times a day (TID) | ORAL | 0 refills | Status: AC
Start: 2020-05-02 — End: 2020-05-07

## 2020-05-02 NOTE — ED Provider Notes (Signed)
Department of Emergency Medicine  [facility]  05/01/2020        Chief Complaint   Patient presents with   . Flank Pain       Patient is a 20 y.o.  female presenting to the ED with chief complaint of lower back pain.    Patient is a 20 year old female with history of Hashimoto's thyroiditis cholecystectomy 3 other laparoscopic abdominal surgeries GERD headaches foot surgery endometriosis irregular periods despite being on Depo-Provera shots if and tonsillectomy surgeries.  Positive nausea without vomiting positive temperature to 101. She did have a negative CT scan on the 2nd when she was here.  Symptoms described as moderate to severe.    Review of Systems:    Constitutional: No chills, weight loss, malaise  Skin: No rashes pruritis, or lesions  HENT: No sore throat, ear pain, or difficulty swallowing  Eyes: No vision changes, redness, discharge, photophobia  Cardio: No chest pain, palpitations, syncope, or edema  Respiratory: No cough or SOB  GI:  No  vomiting. No diarrhea or constipation.  See HPI  GU:  No hematuria, polyuria, or discharge see also HPI  MSK: No new joint pain. neck pain positive bilateral lower back pain  Neuro: No numbness, tingling, or weakness.  No headache or seizures  Psych: No SI or HI., Insomnia    All other symptoms reviewed and are negative, unless commented on in the HPI.     Past Medical History:  Past Medical History:   Diagnosis Date   . Anxiety state    . Endometriosis    . Foot fracture     Right   . Gastroesophageal reflux disease 02/19/2014   . HA (headache)    . Hashimoto's thyroiditis    . Hemorrhage of right ovary 02/19/2015   . Hypothyroidism    . Obesity    . Vitamin D deficiency      Past Surgical History:   Procedure Laterality Date   . Foot surgery Right    . Gallbladder surgery     . Hx other  10/30/2015   . Hx other     . Hx tonsil and adenoidectomy     . Laparoscopic endometriosis fulguration     . Laparoscopic endometriosis fulguration       Above  history reviewed with patient.  Allergies, medication list, and old records also reviewed.     Filed Vitals:    05/01/20 2313   BP: 125/70   Pulse: 92   Resp: 18   Temp: 36.6 C (97.9 F)   SpO2: 100%       Physical Exam:     Nursing note and vitals reviewed.  Vital signs reviewed as above.    Constitutional: Pt is well-developed and well-nourished.  20 year old white female Alert oriented x3, No acute distress.  Head: Normocephalic and atraumatic. Mucosa are pink and moist  Eyes: Conjunctivae are clear and anicteric. Pupils are equal, EOM are intact  Neck: Supple, nontender full range of motion.  Midline trachea  Cardiovascular: RRR.  No Murmurs/rubs/gallops. Distal pulses present and equal bilaterally.  Pulmonary/Chest:  Nontender chest wall Normal BS BL with no distress. No audible wheezes or crackles are noted.  Abdominal: Soft, positive complaints of diffuse tenderness no rebound or guarding positive normoactive bowel sounds positive bilateral CVA tenderness, nondistended.  No pulsatile mass.  Musculoskeletal: Normal range of motion. No deformities.  Exhibits no edema no cords and no calf tenderness.   Neurological: CNs 2-12 grossly  intact.  Symmetric motor sensory all 4s  Skin: Warm and dry. No rash or cyanosis  Psychiatric: Patient has appropriate mood and affect.  No SI or HI    Workup:       Imaging:         Orders Placed This Encounter   . URINE CULTURE,ROUTINE   . URINALYSIS WITH REFLEX MICROSCOPIC AND CULTURE IF POSITIVE   . URINALYSIS, MACRO/MICRO   . HCG, URINE QUALITATIVE, PREGNANCY   . URINALYSIS, MICROSCOPIC   . CBC/DIFF   . COMPREHENSIVE METABOLIC PANEL, NON-FASTING   . LIPASE   . CBC WITH DIFF   . EXTRA TUBES   . BLUE TOP TUBE   . RED TOP TUBE   . HYDROcodone-acetaminophen (NORCO) 5-325 mg per tablet       Abnormal Lab results:  Labs Reviewed   URINALYSIS, MACRO/MICRO - Abnormal; Notable for the following components:       Result Value    COLOR Dark Yellow (*)     APPEARANCE Cloudy (*)     BLOOD  Moderate (*)     LEUKOCYTES Trace (*)     All other components within normal limits   URINALYSIS, MICROSCOPIC - Abnormal; Notable for the following components:    SQUAMOUS EPITHELIAL Moderate (*)     WBCS 10-20 (*)     RBCS 20-30 (*)     BACTERIA Rare (*)     HYALINE CASTS 3-5 (*)     All other components within normal limits   COMPREHENSIVE METABOLIC PANEL, NON-FASTING - Abnormal; Notable for the following components:    CREATININE 0.71 (*)     All other components within normal limits    Narrative:     Estimated Glomerular Filtration Rate (eGFR) calculated using the CKD-EPI (2009) equation, intended for patients 57 years of age and older. If race and/or gender is not documented or "unknown," there will be no eGFR calculation.   HCG, URINE QUALITATIVE, PREGNANCY - Normal    Narrative:     Sensitivity of the qualitative pregnancy test is 20 mIU hCG/mL.  If pregnancy is strongly considered, perform quantitative hCG or repeat in 48 hours.   LIPASE - Normal   URINE CULTURE,ROUTINE   URINALYSIS WITH REFLEX MICROSCOPIC AND CULTURE IF POSITIVE    Narrative:     The following orders were created for panel order URINALYSIS WITH REFLEX MICROSCOPIC AND CULTURE IF POSITIVE.  Procedure                               Abnormality         Status                     ---------                               -----------         ------                     URINALYSIS, MACRO/MICRO[[id number]]      Abnormal            Final result               URINALYSIS, MICROSCOPIC[[id number]]      Abnormal            Final result  Please view results for these tests on the individual orders.   CBC/DIFF    Narrative:     The following orders were created for panel order CBC/DIFF.  Procedure                               Abnormality         Status                     ---------                               -----------         ------                     CBC WITH OLMB[[id number]]                                    Final result                  Please view results for these tests on the individual orders.   CBC WITH DIFF   EXTRA TUBES    Narrative:     The following orders were created for panel order EXTRA TUBES.  Procedure                               Abnormality         Status                     ---------                               -----------         ------                     BLUE TOP FEOF[[id number]]                                    In process                 RED TOP GPQD[[id number]]                                     In process                   Please view results for these tests on the individual orders.   BLUE TOP TUBE   RED TOP TUBE         Plan: Appropriate labs and imaging ordered. Medical Records reviewed.    MDM:   During the patient's stay in the emergency department, the above listed imaging and/or labs were performed to assist with medical decision making and were reviewed by myself when available for review.     Benign abdominal exam normal blood work.  She had a CT scan from the 2nd of this month for the same complaints without any significant surgical or inflammatory changes found.  Urine on the 2nd was clean  today 10-20 white cells 20-30 red cells.  Patient denies having vaginal bleeding at this time.  Feel the risks of radiation from a CT scan at this time with exceed the expected benefits and patient does have close follow-up arranged with her gynecologist who sent a urine culture yesterday.  Will discharge on antibiotics NSAIDs follow-up with her primary care physician and gynecologist.    Pt remained stable throughout the emergency department course.      Impression:  Pelvic and low back pain, UTI, endometriosis  Plan:  Discharged home in stable condition        Duanne Guess, MD  05/02/2020, 01:36

## 2020-05-02 NOTE — Discharge Instructions (Signed)
Drink plenty of fluids take the medications as directed follow-up with your gynecologist as well as her primary doctor soon as possible.  If you develop vomiting fever new problems or concerns return to ED.

## 2020-05-03 ENCOUNTER — Other Ambulatory Visit (HOSPITAL_COMMUNITY): Payer: Self-pay | Admitting: Physician Assistant

## 2020-05-03 LAB — URINE CULTURE,ROUTINE: URINE CULTURE: 35000

## 2020-05-03 MED ORDER — CEPHALEXIN 500 MG CAPSULE
500.0000 mg | ORAL_CAPSULE | Freq: Four times a day (QID) | ORAL | 0 refills | Status: AC
Start: 2020-05-03 — End: 2020-05-10

## 2020-06-02 ENCOUNTER — Other Ambulatory Visit: Payer: Self-pay

## 2020-06-02 ENCOUNTER — Ambulatory Visit: Payer: No Typology Code available for payment source

## 2020-06-02 DIAGNOSIS — R300 Dysuria: Secondary | ICD-10-CM | POA: Insufficient documentation

## 2020-06-04 LAB — URINE CULTURE,ROUTINE: URINE CULTURE: 60000 — AB

## 2020-06-12 ENCOUNTER — Telehealth (INDEPENDENT_AMBULATORY_CARE_PROVIDER_SITE_OTHER): Payer: Self-pay | Admitting: Family Medicine

## 2020-06-12 NOTE — Telephone Encounter (Signed)
Patient called in stating she has had 5-6 syncopal episodes in 2 weeks, severe headache and back pain, also reports that her heart rate has not gone below 120 in 2 weeks. She stated she called her gyne because she just received her 4th depo shot. They advised her to make an appt with PCP.  I advised patient to report to ED asap. Verbalized understanding

## 2020-06-15 ENCOUNTER — Encounter (INDEPENDENT_AMBULATORY_CARE_PROVIDER_SITE_OTHER): Payer: Self-pay | Admitting: NURSE PRACTITIONER

## 2020-06-15 ENCOUNTER — Ambulatory Visit (INDEPENDENT_AMBULATORY_CARE_PROVIDER_SITE_OTHER): Payer: No Typology Code available for payment source | Admitting: NURSE PRACTITIONER

## 2020-06-15 ENCOUNTER — Other Ambulatory Visit: Payer: Self-pay

## 2020-06-15 VITALS — BP 138/88 | HR 107 | Temp 98.4°F | Ht 62.0 in | Wt 173.6 lb

## 2020-06-15 DIAGNOSIS — Z6831 Body mass index (BMI) 31.0-31.9, adult: Secondary | ICD-10-CM

## 2020-06-15 DIAGNOSIS — R000 Tachycardia, unspecified: Secondary | ICD-10-CM

## 2020-06-15 DIAGNOSIS — R55 Syncope and collapse: Secondary | ICD-10-CM

## 2020-06-15 DIAGNOSIS — R079 Chest pain, unspecified: Secondary | ICD-10-CM

## 2020-06-15 DIAGNOSIS — R519 Headache, unspecified: Secondary | ICD-10-CM

## 2020-06-15 NOTE — Progress Notes (Signed)
PRIMARY CARE, [facility]  [address] [place] [address]  Phone: [phone]  Fax: [phone]    Encounter Date: 06/15/2020    Patient ID:  CRUCITA LACORTE  FBX:[id number]    DOB: 02-08-2001  Age: 20 y.o. female    Subjective:     Chief Complaint   Patient presents with   . Headache   . Rapid Heart Rate         . Chest Pain      The patient presents today for an acute appointment.  She is complaining of chest pain, rapid heart rate, syncopal episodes, and headaches.  She has been advised to report to the emergency department this past Friday however, she left without being seen because she says the staff was rude to her and was told there would be a three hour wait.  She tells me her grandmother did recently pass away from COVID-19 on May 29, 2020. She tells me she is also having syncopal episodes she says she has passed out 6 times in the past 1 week.  The last episode was on Monday.  She tells me she gets dizzy and then falls to the floor.  She denies loss of bowel or bladder during these events.  She tells me a family member told her she is not out longer than 3 minutes when this happens.  She tells me her headaches are the worst of her life.  Current headache has been ongoing for the past 5 days 5/10 and is a dull pain.  She has not been vaccinated for covid.  She tells me as far as she knows she has not had covid.          Current Outpatient Medications   Medication Sig   . famotidine (PEPCID) 20 mg Oral Tablet Take 1 Tablet (20 mg total) by mouth Twice daily   . HYDROcodone-acetaminophen (NORCO) 5-325 mg Oral Tablet Take 1 Tablet by mouth Every 6 hours as needed for Pain   . medroxyPROGESTERone (DEPO-PROVERA) 150 mg/mL IntraMUSCULAR Suspension INJECT 1 ML (150 MG TOTAL) INTRAMUSCULARLY EVERY 3 MONTHS   . ondansetron (ZOFRAN ODT) 4 mg Oral Tablet, Rapid Dissolve Take 1 Tablet (4 mg total) by mouth Every 8 hours as needed for Nausea/Vomiting     Allergies   Allergen Reactions   .  Sulfamethoxazole-Trimethoprim Hives/ Urticaria     Past Medical History:   Diagnosis Date   . Anxiety state    . Endometriosis    . Foot fracture     Right   . Gastroesophageal reflux disease 02/19/2014   . HA (headache)    . Hashimoto's thyroiditis    . Hemorrhage of right ovary 02/19/2015   . Hypothyroidism    . Obesity    . Vitamin D deficiency          Past Surgical History:   Procedure Laterality Date   . FOOT SURGERY Right    . GALLBLADDER SURGERY      Cholecystectomy   . HX OTHER  10/30/2015    DIAG LAPARO SEPARATE PROC - fulgration of endometriotic implants   . HX OTHER      ORIF Right foot   . HX TONSIL AND ADENOIDECTOMY     . LAPAROSCOPIC ENDOMETRIOSIS FULGURATION     . LAPAROSCOPIC ENDOMETRIOSIS FULGURATION      2nd surgery was to also drain a blood clot.         Family Medical History:  Problem Relation (Age of Onset)    No Known Problems Mother, Father          Social History     Tobacco Use   . Smoking status: Never Smoker   . Smokeless tobacco: Never Used   Vaping Use   . Vaping Use: Every day   . Substances: Nicotine   . Devices: Pre-filled pod   Substance Use Topics   . Alcohol use: Yes     Comment: occasionally   . Drug use: Yes     Types: Marijuana       Review of Systems   Constitutional: Negative.    HENT: Negative.    Eyes: Negative.    Respiratory: Negative.    Cardiovascular: Positive for chest pain and palpitations.   Gastrointestinal: Negative.    Endocrine: Negative.    Genitourinary: Negative.    Musculoskeletal: Negative.    Skin: Negative.    Neurological: Positive for syncope and headaches.   Psychiatric/Behavioral: Negative.      Objective:   Vitals: BP (!) 142/100 (Site: Right, Patient Position: Sitting, Cuff Size: Adult Large)   Pulse (!) 107   Temp 36.9 C (98.4 F) (Thermal Scan)   Ht 1.575 m (5\' 2" )   Wt 78.7 kg (173 lb 9.6 oz)   SpO2 98%   BMI 31.75 kg/m         Physical Exam  Constitutional:       General: She is not in acute distress.     Appearance: Normal  appearance. She is not ill-appearing.   HENT:      Head: Normocephalic and atraumatic.      Right Ear: Tympanic membrane normal.      Left Ear: Tympanic membrane normal.      Nose: Nose normal.      Mouth/Throat:      Mouth: Mucous membranes are moist.   Eyes:      Extraocular Movements: Extraocular movements intact.      Pupils: Pupils are equal, round, and reactive to light.   Cardiovascular:      Rate and Rhythm: Normal rate and regular rhythm.      Pulses: Normal pulses.      Heart sounds: Normal heart sounds.   Pulmonary:      Effort: Pulmonary effort is normal.      Breath sounds: Normal breath sounds.   Abdominal:      Palpations: Abdomen is soft.      Tenderness: There is no abdominal tenderness.   Musculoskeletal:         General: Normal range of motion.      Cervical back: Normal range of motion and neck supple.   Skin:     General: Skin is warm and dry.      Capillary Refill: Capillary refill takes less than 2 seconds.   Neurological:      General: No focal deficit present.      Mental Status: She is alert and oriented to person, place, and time.      Motor: No weakness.   Psychiatric:         Mood and Affect: Mood normal.         Behavior: Behavior normal.         Assessment & Plan:       ICD-10-CM    1. Headache  R51.9 COVID-19 SCREENING - OUTPATIENT AND DRIVE UP TESTING     XR CHEST PA AND LATERAL     ECG 12  LEAD     CBC     Comp Metabolic Panel-Fasting     Troponin-I     PT/INR     PTT     THYROID STIMULATING HORMONE (SENSITIVE TSH)     CT Brain WO IV Contrast     Refer to [facility]   2. Chest pain, unspecified type  R07.9 COVID-19 SCREENING - OUTPATIENT AND DRIVE UP TESTING     XR CHEST PA AND LATERAL     ECG 12 LEAD     CBC     Comp Metabolic Panel-Fasting     Troponin-I     PT/INR     PTT     THYROID STIMULATING HORMONE (SENSITIVE TSH)     CT Brain WO IV Contrast   3. Tachycardia  R00.0 COVID-19 SCREENING - OUTPATIENT AND DRIVE UP TESTING     XR CHEST PA AND LATERAL     ECG 12  LEAD     CBC     Comp Metabolic Panel-Fasting     Troponin-I     PT/INR     PTT     THYROID STIMULATING HORMONE (SENSITIVE TSH)     CT Brain WO IV Contrast     Refer to [facility]   4. Syncope, unspecified syncope type  R55 COVID-19 SCREENING - OUTPATIENT AND DRIVE UP TESTING     XR CHEST PA AND LATERAL     ECG 12 LEAD     CBC     Comp Metabolic Panel-Fasting     Troponin-I     PT/INR     PTT     THYROID STIMULATING HORMONE (SENSITIVE TSH)     CT Brain WO IV Contrast     Refer to [facility]   5. BMI 31.0-31.9,adult  Z68.31 COVID-19 SCREENING - OUTPATIENT AND DRIVE UP TESTING     XR CHEST PA AND LATERAL     ECG 12 LEAD     CBC     Comp Metabolic Panel-Fasting     Troponin-I     PT/INR     PTT     THYROID STIMULATING HORMONE (SENSITIVE TSH)     CT Brain WO IV Contrast     Refer to [facility]     Orders Placed This Encounter   . XR CHEST PA AND LATERAL   . CT Brain WO IV Contrast   . COVID-19 SCREENING - OUTPATIENT AND DRIVE UP TESTING   . CBC   . Comp Metabolic Panel-Fasting   . Troponin-I   . PT/INR   . PTT   . THYROID STIMULATING HORMONE (SENSITIVE TSH)   . Refer to [employer]   . ECG 12 LEAD     I recommended the patient report to the emergency department due to her symptoms and she declines.  Stat testing was ordered for the patient and further determination to be based on those results.  She was advised should her symptoms worsen at all she needs to report to the emergency department and she verbalized understanding.  Will await results of stat testing for further recommendations.  Referral was placed to Neurology for further evaluation.    [employer], CRNP

## 2020-06-15 NOTE — Nursing Note (Signed)
Patient is here for chest pain, rapid heartbeat and headache. She states she was instructed to go to ER on Friday by our office. Patient states she went to ER, but did not stay. She states staff was extremely rude and told her she was having an anxiety attack and overweight and that it would be a 3 hour wait. Patient states her grandma passed away from COVID-19 Jun 16, 2020. Patient states she tested negative for COVID. Patient states she has tested herself the last 3 days with at home COVID-19 test and all tests were negative.

## 2020-06-16 ENCOUNTER — Ambulatory Visit (HOSPITAL_BASED_OUTPATIENT_CLINIC_OR_DEPARTMENT_OTHER)
Admission: RE | Admit: 2020-06-16 | Discharge: 2020-06-16 | Disposition: A | Discharge status: Home / Self Care | Payer: No Typology Code available for payment source | Source: Ambulatory Visit | Attending: NURSE PRACTITIONER | Admitting: NURSE PRACTITIONER

## 2020-06-16 ENCOUNTER — Ambulatory Visit
Admission: RE | Admit: 2020-06-16 | Discharge: 2020-06-16 | Disposition: A | Discharge status: Home / Self Care | Payer: No Typology Code available for payment source | Source: Ambulatory Visit | Attending: NURSE PRACTITIONER | Admitting: NURSE PRACTITIONER

## 2020-06-16 ENCOUNTER — Ambulatory Visit (HOSPITAL_COMMUNITY): Payer: No Typology Code available for payment source

## 2020-06-16 DIAGNOSIS — R000 Tachycardia, unspecified: Secondary | ICD-10-CM | POA: Insufficient documentation

## 2020-06-16 DIAGNOSIS — R55 Syncope and collapse: Secondary | ICD-10-CM

## 2020-06-16 DIAGNOSIS — Z6831 Body mass index (BMI) 31.0-31.9, adult: Secondary | ICD-10-CM

## 2020-06-16 DIAGNOSIS — J029 Acute pharyngitis, unspecified: Secondary | ICD-10-CM | POA: Insufficient documentation

## 2020-06-16 DIAGNOSIS — R079 Chest pain, unspecified: Secondary | ICD-10-CM | POA: Insufficient documentation

## 2020-06-16 DIAGNOSIS — R519 Headache, unspecified: Secondary | ICD-10-CM

## 2020-06-16 LAB — CBC
HCT: 39.8 % (ref 34.8–46.0)
HGB: 13.3 g/dL (ref 11.5–16.0)
MCH: 27.7 pg (ref 26.0–32.0)
MCHC: 33.4 g/dL (ref 31.0–35.5)
MCV: 82.7 fL (ref 78.0–100.0)
MPV: 11.3 fL (ref 8.7–12.5)
PLATELETS: 278 10*3/uL (ref 150–400)
RBC: 4.81 10*6/uL (ref 3.85–5.22)
RDW-CV: 14.3 % (ref 11.5–15.5)
WBC: 9.7 10*3/uL (ref 3.7–11.0)

## 2020-06-16 LAB — COMPREHENSIVE METABOLIC PNL, FASTING
ALBUMIN/GLOBULIN RATIO: 1.7 (ref >=1.0)
ALBUMIN: 4.7 g/dL (ref 3.5–5.2)
ALKALINE PHOSPHATASE: 52 U/L (ref 41–133)
ALT (SGPT): 8 U/L (ref 0–55)
ANION GAP: 7 mmol/L (ref 6–15)
AST (SGOT): 12 U/L (ref 5–34)
BILIRUBIN TOTAL: 0.7 mg/dL (ref 0.2–1.2)
BUN: 9 mg/dL (ref 7–21)
CALCIUM: 9.6 mg/dL (ref 8.0–10.6)
CHLORIDE: 105 mmol/L (ref 98–107)
CO2 TOTAL: 27 mmol/L (ref 21–32)
CREATININE: 0.84 mg/dL (ref 0.80–1.60)
ESTIMATED GFR: >60 mL/min/{1.73_m2}
GLUCOSE: 93 mg/dL (ref 70–100)
POTASSIUM: 4.4 mmol/L (ref 3.3–5.1)
PROTEIN TOTAL: 7.4 g/dL (ref 6.4–8.3)
SODIUM: 139 mmol/L (ref 136–146)

## 2020-06-16 LAB — TROPONIN-I, HIGH SENSITIVITY: TROPONIN I, HIGH SENSITIVITY: <2 pg/mL — ABNORMAL LOW (ref 2.00–20.00)

## 2020-06-16 LAB — PT/INR
INR: 1.01 (ref 0.90–1.10)
PROTHROMBIN TIME: 10.3 seconds (ref 9.0–13.0)

## 2020-06-16 LAB — THYROID STIMULATING HORMONE (SENSITIVE TSH): TSH: 4.606 u[IU]/mL (ref 0.200–5.000)

## 2020-06-16 LAB — MONO TEST: MONONUCLEOSIS RAPID TEST: NEGATIVE

## 2020-06-16 LAB — PTT (PARTIAL THROMBOPLASTIN TIME): APTT: 30 seconds (ref 23.0–32.0)

## 2020-06-17 ENCOUNTER — Telehealth (INDEPENDENT_AMBULATORY_CARE_PROVIDER_SITE_OTHER): Payer: Self-pay | Admitting: NURSE PRACTITIONER

## 2020-06-17 DIAGNOSIS — R519 Headache, unspecified: Secondary | ICD-10-CM

## 2020-06-17 DIAGNOSIS — R079 Chest pain, unspecified: Secondary | ICD-10-CM

## 2020-06-17 DIAGNOSIS — R000 Tachycardia, unspecified: Secondary | ICD-10-CM

## 2020-06-17 DIAGNOSIS — R55 Syncope and collapse: Secondary | ICD-10-CM

## 2020-06-17 LAB — ECG 12 LEAD
Atrial Rate: 91 {beats}/min
Calculated P Axis: 40 degrees
Calculated R Axis: 89 degrees
Calculated T Axis: 23 degrees
PR Interval: 132 ms
QRS Duration: 92 ms
QT Interval: 348 ms
QTC Calculation: 428 ms
Ventricular rate: 91 {beats}/min

## 2020-06-17 NOTE — Telephone Encounter (Signed)
Pt states she already made appt with neurology at [facility] on 3/2. Pt aware hospital will call pt with appt for stress test and Holter. Please advise

## 2020-06-17 NOTE — Telephone Encounter (Signed)
Please advise patient her blood work and CT scan were normal.  I am placing a referral to Neurology for further evaluation.  Also, I have ordered a Holter monitor and stress test for the patient.  Thank you.

## 2020-06-23 ENCOUNTER — Other Ambulatory Visit (INDEPENDENT_AMBULATORY_CARE_PROVIDER_SITE_OTHER): Payer: Self-pay | Admitting: NURSE PRACTITIONER

## 2020-06-23 DIAGNOSIS — R079 Chest pain, unspecified: Secondary | ICD-10-CM

## 2020-06-23 DIAGNOSIS — R000 Tachycardia, unspecified: Secondary | ICD-10-CM

## 2020-06-23 DIAGNOSIS — R55 Syncope and collapse: Secondary | ICD-10-CM

## 2020-06-24 ENCOUNTER — Ambulatory Visit (HOSPITAL_COMMUNITY): Admission: RE | Admit: 2020-06-24 | Payer: No Typology Code available for payment source | Source: Ambulatory Visit

## 2020-06-24 ENCOUNTER — Other Ambulatory Visit: Payer: Self-pay

## 2020-06-24 ENCOUNTER — Ambulatory Visit (HOSPITAL_BASED_OUTPATIENT_CLINIC_OR_DEPARTMENT_OTHER)
Admission: RE | Admit: 2020-06-24 | Discharge: 2020-06-24 | Disposition: A | Discharge status: Home / Self Care | Payer: No Typology Code available for payment source | Source: Ambulatory Visit

## 2020-06-24 ENCOUNTER — Other Ambulatory Visit (INDEPENDENT_AMBULATORY_CARE_PROVIDER_SITE_OTHER): Payer: Self-pay | Admitting: NURSE PRACTITIONER

## 2020-06-24 ENCOUNTER — Ambulatory Visit
Admission: RE | Admit: 2020-06-24 | Discharge: 2020-06-24 | Disposition: A | Discharge status: Home / Self Care | Payer: No Typology Code available for payment source | Source: Ambulatory Visit | Attending: NURSE PRACTITIONER | Admitting: NURSE PRACTITIONER

## 2020-06-24 DIAGNOSIS — R079 Chest pain, unspecified: Secondary | ICD-10-CM | POA: Insufficient documentation

## 2020-06-24 DIAGNOSIS — R55 Syncope and collapse: Secondary | ICD-10-CM | POA: Insufficient documentation

## 2020-06-24 DIAGNOSIS — R000 Tachycardia, unspecified: Secondary | ICD-10-CM | POA: Insufficient documentation

## 2020-06-24 NOTE — Nurses Notes (Signed)
Presented to stress lab with steady gait. Teaching for test done and understanding voiced. Tolerated well. Drank water easily. Gait steady. Dr Wynona Neat reviewed report with [facility]. Belongings at bedside. For Holter monitor.

## 2020-06-29 ENCOUNTER — Other Ambulatory Visit: Payer: Self-pay

## 2020-06-29 ENCOUNTER — Ambulatory Visit: Payer: No Typology Code available for payment source | Attending: Urology

## 2020-06-29 ENCOUNTER — Telehealth (INDEPENDENT_AMBULATORY_CARE_PROVIDER_SITE_OTHER): Payer: Self-pay | Admitting: NURSE PRACTITIONER

## 2020-06-29 DIAGNOSIS — N390 Urinary tract infection, site not specified: Secondary | ICD-10-CM | POA: Insufficient documentation

## 2020-06-29 LAB — URINALYSIS, MACRO/MICRO
BILIRUBIN: NEGATIVE mg/dL
BLOOD: NEGATIVE mg/dL
GLUCOSE: NEGATIVE mg/dL
KETONES: NEGATIVE mg/dL
NITRITE: NEGATIVE
PH: 7.5 (ref 5.0–9.0)
PROTEIN: NEGATIVE mg/dL
SPECIFIC GRAVITY: 1.007 (ref 1.001–1.030)
UROBILINOGEN: 0.2 mg/dL (ref 0.2–1.0)

## 2020-06-29 LAB — STRESS TEST - ADULT
% MPHR: 93
Angina Index: 0
Baseline HR: 80 {beats}/min
CVIS DUKE TREADMILL SCORE: 9 mmHg
Estimated workload: 10.3 METS
Exercise duration (min): 9 min
Exercise duration (sec): 7 s
Peak Diastolic BP for Stress Tests: 93 mm/Hg
Peak Systolic BP Stress Test: 198 mm/Hg
Post peak HR: 186 {beats}/min
Predicted Max HR: 199 {beats}/min
RPP: 36828
ST DEPRESSION - BASELINE: 0 mm
ST DEPRESSION - STRESS: 0 mm
Target HR: 169 {beats}/min

## 2020-06-29 LAB — URINALYSIS, MICROSCOPIC: BACTERIA: NEGATIVE /hpf

## 2020-06-29 NOTE — Telephone Encounter (Signed)
Patient is aware and verbally understands.

## 2020-06-29 NOTE — Telephone Encounter (Signed)
Please advise patient her stress test was negative.  Thank you.

## 2020-07-02 LAB — URINE CULTURE,ROUTINE: URINE CULTURE: 30000 — AB

## 2020-07-17 ENCOUNTER — Telehealth (INDEPENDENT_AMBULATORY_CARE_PROVIDER_SITE_OTHER): Payer: Self-pay | Admitting: Family Medicine

## 2020-07-17 NOTE — Telephone Encounter (Signed)
Patient called in to office with reports of continued, transient chest heaviness/pressure. Recent cardiac work up negative. She reports a syncopal episode at work last night, was out for 2 minutes, then felt "completely fine." Patient states this happens to her frequently. She did not seek medical treatment after syncope last night.   Patient was referred to neurology, had an appt scheduled for March 2, but it got pushed back to May 3.   Patient instructed to report to ED with worsening symptoms. Please advise

## 2020-07-17 NOTE — Telephone Encounter (Signed)
Agree with recommendations to report to ED.

## 2020-07-19 ENCOUNTER — Emergency Department (HOSPITAL_COMMUNITY): Payer: No Typology Code available for payment source

## 2020-07-19 ENCOUNTER — Encounter (HOSPITAL_COMMUNITY): Payer: Self-pay

## 2020-07-19 ENCOUNTER — Emergency Department
Admission: EM | Admit: 2020-07-19 | Discharge: 2020-07-19 | Discharge status: Against Medical Advice | Payer: No Typology Code available for payment source | Attending: Emergency Medicine | Admitting: Emergency Medicine

## 2020-07-19 ENCOUNTER — Other Ambulatory Visit: Payer: Self-pay

## 2020-07-19 DIAGNOSIS — Z5329 Procedure and treatment not carried out because of patient's decision for other reasons: Secondary | ICD-10-CM | POA: Insufficient documentation

## 2020-07-19 DIAGNOSIS — I498 Other specified cardiac arrhythmias: Secondary | ICD-10-CM

## 2020-07-19 DIAGNOSIS — R55 Syncope and collapse: Secondary | ICD-10-CM | POA: Insufficient documentation

## 2020-07-19 DIAGNOSIS — R079 Chest pain, unspecified: Secondary | ICD-10-CM | POA: Insufficient documentation

## 2020-07-19 LAB — COMPREHENSIVE METABOLIC PANEL, NON-FASTING
ALBUMIN/GLOBULIN RATIO: 1.7 (ref >=1.0)
ALBUMIN: 5.2 g/dL (ref 3.5–5.2)
ALKALINE PHOSPHATASE: 52 U/L (ref 41–133)
ALT (SGPT): 22 U/L (ref 0–55)
ANION GAP: 10 mmol/L (ref 6–15)
AST (SGOT): 28 U/L (ref 5–34)
BILIRUBIN TOTAL: 0.8 mg/dL (ref 0.2–1.2)
BUN: 9 mg/dL (ref 7–21)
CALCIUM: 10.1 mg/dL (ref 8.0–10.6)
CHLORIDE: 105 mmol/L (ref 98–107)
CO2 TOTAL: 23 mmol/L (ref 21–32)
CREATININE: 0.76 mg/dL — ABNORMAL LOW (ref 0.80–1.60)
ESTIMATED GFR: >60 mL/min/{1.73_m2}
GLUCOSE: 86 mg/dL (ref 70–100)
POTASSIUM: 3.9 mmol/L (ref 3.3–5.1)
PROTEIN TOTAL: 8.3 g/dL (ref 6.4–8.3)
SODIUM: 138 mmol/L (ref 136–146)

## 2020-07-19 LAB — CBC WITH DIFF
BASOPHIL #: <0.1 10*3/uL (ref <=0.20)
BASOPHIL %: 0 %
EOSINOPHIL #: <0.1 10*3/uL (ref <=0.50)
EOSINOPHIL %: 1 %
HCT: 42.3 % (ref 34.8–46.0)
HGB: 14 g/dL (ref 11.5–16.0)
IMMATURE GRANULOCYTE #: 0.12 10*3/uL — ABNORMAL HIGH (ref <0.10)
IMMATURE GRANULOCYTE %: 2 % — ABNORMAL HIGH (ref 0–1)
LYMPHOCYTE #: 1.88 10*3/uL (ref 1.00–4.80)
LYMPHOCYTE %: 26 %
MCH: 27.9 pg (ref 26.0–32.0)
MCHC: 33.1 g/dL (ref 31.0–35.5)
MCV: 84.4 fL (ref 78.0–100.0)
MONOCYTE #: 0.51 10*3/uL (ref 0.20–1.10)
MONOCYTE %: 7 %
MPV: 12 fL (ref 8.7–12.5)
NEUTROPHIL #: 4.73 10*3/uL (ref 1.50–7.70)
NEUTROPHIL %: 64 %
PLATELETS: 281 10*3/uL (ref 150–400)
RBC: 5.01 10*6/uL (ref 3.85–5.22)
RDW-CV: 14.1 % (ref 11.5–15.5)
WBC: 7.3 10*3/uL (ref 3.7–11.0)

## 2020-07-19 LAB — PTT (PARTIAL THROMBOPLASTIN TIME): APTT: 32 seconds (ref 23.0–32.0)

## 2020-07-19 LAB — LIGHT GREEN TOP TUBE

## 2020-07-19 LAB — D-DIMER: D-DIMER: 0.19 mg/L FEU (ref <0.50)

## 2020-07-19 LAB — HCG, SERUM QUALITATIVE, PREGNANCY: PREGNANCY, SERUM QUALITATIVE: NEGATIVE

## 2020-07-19 LAB — TROPONIN I, HIGH SENSITIVITY: TROPONIN I, HIGH SENSITIVITY: <2 pg/mL — ABNORMAL LOW (ref 2.00–20.00)

## 2020-07-19 LAB — THYROID STIMULATING HORMONE (SENSITIVE TSH): TSH: 2.908 u[IU]/mL (ref 0.200–5.000)

## 2020-07-19 LAB — PT/INR
INR: 1.02 (ref 0.90–1.10)
PROTHROMBIN TIME: 10.4 seconds (ref 9.0–13.0)

## 2020-07-19 LAB — MAGNESIUM: MAGNESIUM: 2.1 mg/dL (ref 1.5–2.5)

## 2020-07-19 LAB — LAVENDER TOP TUBE

## 2020-07-19 MED ORDER — SODIUM CHLORIDE 0.9 % (FLUSH) INJECTION SYRINGE
3.0000 mL | INJECTION | INTRAMUSCULAR | Status: DC | PRN
Start: 2020-07-19 — End: 2020-07-19

## 2020-07-19 MED ORDER — SODIUM CHLORIDE 0.9 % (FLUSH) INJECTION SYRINGE
3.0000 mL | INJECTION | Freq: Three times a day (TID) | INTRAMUSCULAR | Status: DC
Start: 2020-07-19 — End: 2020-07-19
  Administered 2020-07-19: 3 mL

## 2020-07-19 NOTE — ED Nurses Note (Signed)
Patient requesting to leave AMA. MD notified. Provided instructions. AMA paper signed

## 2020-07-19 NOTE — ED Triage Notes (Signed)
CHEST PAIN OFF/ON FOR ABOUT A MONTH, CP WORSE X 2 DAYS, LAST PM, HR WAS LOW, PT PASSED OUT FOR 3 MINUTES.

## 2020-07-19 NOTE — ED Provider Notes (Signed)
Department of Emergency Medicine  [facility]  07/19/2020        Chief Complaint   Patient presents with    Chest Pain        Patient is a 20 y.o.  female presenting to the ED with chief complaint of   Recurrent chest pain and syncope. Patient reports she is undergoing a work up for recurrent chest pain and syncope, She has had labs, a Holter and stress test but does not know results. She has had intermittent chest pain throughout the day today and passed out again. Reports has passed out 14 times in past 2 weeks.        Review of Systems:    Constitutional: No fever, chills  Skin: No rashes or lesions  HENT: No sore throat, ear pain, or difficulty swallowing  Eyes: No vision changes, redness, discharge  Cardio: recurrent chest pain and recurrent syncope  Respiratory: No cough, wheezing or SOB  GI:  No nausea or vomiting. No diarrhea or constipation. No abdominal pain  GU:  No dysuria, hematuria, polyuria  MSK: No joint pain.  No neck or back pain  Neuro: No numbness, tingling, or weakness.  No headache  Psych: No SI or HI. Normal mood    All other symptoms reviewed and are negative, unless commented on in the HPI.     Past Medical History:  Past Medical History:   Diagnosis Date    Anxiety state     Endometriosis     Foot fracture     Right    Gastroesophageal reflux disease 02/19/2014    HA (headache)     Hashimoto's thyroiditis     Hemorrhage of right ovary 02/19/2015    Hypothyroidism     Obesity     Vitamin D deficiency      Past Surgical History:   Procedure Laterality Date    Foot surgery Right     Gallbladder surgery      Hx other  10/30/2015    Hx other      Hx tonsil and adenoidectomy      Laparoscopic endometriosis fulguration      Laparoscopic endometriosis fulguration       Above history reviewed with patient.  Allergies, medication list, and old records also reviewed.     Filed Vitals:    07/19/20 1117   BP: (!) 154/90   Pulse: 83   Resp: 18   Temp: 37 C (98.6  F)   SpO2: 98%       Physical Exam:     Nursing note and vitals reviewed.  Vital signs reviewed as above. No acute distress.   Constitutional: Pt is well-developed and well-nourished.   Head: Normocephalic and atraumatic.   Eyes: Conjunctivae are normal. Pupils are equal, round, and reactive to light. EOM are intact  Neck: Soft, supple, full range of motion.  Cardiovascular: RRR.  No Murmurs/rubs/gallops. Distal pulses present and equal bilaterally.  Pulmonary/Chest: Normal BS BL with no distress. No audible wheezes or crackles are noted.  Abdominal: Soft, nontender, nondistended.  No rebound, guarding, or masses.  Musculoskeletal: Normal range of motion. No deformities.  Exhibits no edema and no tenderness.   Neurological: CNs 2-12 grossly intact.  No focal deficits noted.  Skin: Warm and dry. No rash or lesions  Psychiatric: Patient has a normal mood and affect.     Workup:     Labs:  No results found for this or any previous visit (from  the past 24 hour(s)).    Imaging:    Results for orders placed or performed during the hospital encounter of 07/19/20 (from the past 72 hour(s))   XR AP MOBILE CHEST     Status: None    Narrative    INDICATION:  Cough, generalized chest pain.    TECHNIQUE:  Frontal chest was obtained at 12:09 hours.    COMPARISON:  04/07/2020 XR Chest    FINDINGS:    The cardiomediastinal silhouette is normal in size.  There is no consolidation or atelectasis in either lung.  There are no pleural effusions.  There is no pneumothorax.  No acute osseous process.        Impression    No acute process.      Signed by Maurene Capes, MD       Orders Placed This Encounter    XR AP MOBILE CHEST    CBC/DIFF    COMPREHENSIVE METABOLIC PANEL, NON-FASTING    MAGNESIUM    PT/INR    PTT (PARTIAL THROMBOPLASTIN TIME)    TROPONIN I, HIGH SENSITIVITY    D-DIMER    HCG, SERUM QUALITATIVE, PREGNANCY    THYROID STIMULATING HORMONE (SENSITIVE TSH)    CBC WITH DIFF    EXTRA TUBES    LIGHT GREEN TOP TUBE     LAVENDER TOP TUBE    CANCELED: COVID-19 - SCREENING - Admission (NON-PUI)    CANCELED: PULSE OXIMETRY - CONTINUOUS    CANCELED: OXYGEN - NASAL CANNULA    ECG 12 LEAD    CANCELED: ECG 12 LEAD    CANCELED: INSERT & MAINTAIN PERIPHERAL IV ACCESS    CANCELED: PERIPHERAL IV DRESSING CHANGE    CANCELED: [facility] ADMISSION BED REQUEST - ED USE ONLY       Abnormal Lab results:  Labs Reviewed   COMPREHENSIVE METABOLIC PANEL, NON-FASTING - Abnormal; Notable for the following components:       Result Value    CREATININE 0.76 (*)     All other components within normal limits    Narrative:     Estimated Glomerular Filtration Rate (eGFR) calculated using the CKD-EPI (2009) equation, intended for patients 39 years of age and older. If race and/or gender is not documented or "unknown," there will be no eGFR calculation.   TROPONIN I, HIGH SENSITIVITY - Abnormal; Notable for the following components:    TROPONIN I, HIGH SENSITIVITY <2.00 (*)     All other components within normal limits   CBC WITH DIFF - Abnormal; Notable for the following components:    IMMATURE GRANULOCYTE % 2 (*)     IMMATURE GRANULOCYTE # 0.12 (*)     All other components within normal limits   MAGNESIUM - Normal   PT/INR - Normal    Narrative:     INR Reference Range: 0.9-1.1  Moderate Intensity Warfarin Therapy: 2.0-3.0  Higher Intensity Warfarin Therapy:  2.5-3.5   PTT (PARTIAL THROMBOPLASTIN TIME) - Normal    Narrative:     Therapeutic range for unfractionated heparin is 42-62 seconds.       D-DIMER - Normal    Narrative:     Normal D-dimer results have a high negative predictive value for excluding DVT or PE. Results should be interpreted in conjunction with the clinical pre-test probability of venous thromboembolism.   HCG, SERUM QUALITATIVE, PREGNANCY - Normal   THYROID STIMULATING HORMONE (SENSITIVE TSH) - Normal   CBC/DIFF    Narrative:     The following orders were created  for panel order CBC/DIFF.  Procedure                                Abnormality         Status                     ---------                               -----------         ------                     CBC WITH DIFF[[id number]]                Abnormal            Final result                 Please view results for these tests on the individual orders.       ECG:nsr, normal    Plan: Appropriate labs and imaging ordered. Medical Records reviewed.    MDM:   During the patient's stay in the emergency department, the above listed imaging and/or labs were performed to assist with medical decision making and were reviewed by myself when available for review.     Work up negative  Arrangements for admit  Decides to leave against medical advice    Pt remained stable throughout the emergency department course.           Impression:   Encounter Diagnoses   Name Primary?    Recurrent syncope Yes    Chest pain, unspecified type        Plan: admit, left AMA      Richelle Ito, MD  07/20/2020, 19:09

## 2020-07-20 ENCOUNTER — Ambulatory Visit (INDEPENDENT_AMBULATORY_CARE_PROVIDER_SITE_OTHER): Payer: Self-pay | Admitting: Family Medicine

## 2020-07-20 NOTE — Telephone Encounter (Signed)
Regarding: ER follow up   ----- Message from Donald Pore [facility] sent at 07/20/2020 11:12 AM EDT -----  Pt has Er follow up scheduled for Tuesday 07/21/2020   chest pains / passed out / low heart rate / [facility] hospital

## 2020-07-21 ENCOUNTER — Encounter (INDEPENDENT_AMBULATORY_CARE_PROVIDER_SITE_OTHER): Payer: Self-pay | Admitting: NURSE PRACTITIONER

## 2020-07-21 ENCOUNTER — Other Ambulatory Visit: Payer: Self-pay

## 2020-07-21 ENCOUNTER — Ambulatory Visit (INDEPENDENT_AMBULATORY_CARE_PROVIDER_SITE_OTHER): Payer: No Typology Code available for payment source | Admitting: NURSE PRACTITIONER

## 2020-07-21 VITALS — BP 132/88 | HR 80 | Temp 97.1°F | Resp 18 | Ht 62.0 in | Wt 169.0 lb

## 2020-07-21 DIAGNOSIS — E669 Obesity, unspecified: Secondary | ICD-10-CM

## 2020-07-21 DIAGNOSIS — R55 Syncope and collapse: Secondary | ICD-10-CM

## 2020-07-21 DIAGNOSIS — Z6830 Body mass index (BMI) 30.0-30.9, adult: Secondary | ICD-10-CM

## 2020-07-21 DIAGNOSIS — R079 Chest pain, unspecified: Secondary | ICD-10-CM

## 2020-07-21 DIAGNOSIS — R110 Nausea: Secondary | ICD-10-CM

## 2020-07-21 DIAGNOSIS — F17290 Nicotine dependence, other tobacco product, uncomplicated: Secondary | ICD-10-CM

## 2020-07-21 MED ORDER — ONDANSETRON 4 MG DISINTEGRATING TABLET
4.0000 mg | ORAL_TABLET | Freq: Three times a day (TID) | ORAL | 0 refills | Status: DC | PRN
Start: 2020-07-21 — End: 2020-10-22

## 2020-07-21 NOTE — Progress Notes (Signed)
PRIMARY CARE, [facility]  [address] [place] [address]  Phone: [phone]  Fax: [phone]    Encounter Date: 07/21/2020    Patient ID:  Traci [name]  OJJ:[id number]    DOB: 2000-06-20  Age: 20 y.o. female    Subjective:     Chief Complaint   Patient presents with   . ED Follow-up     The patient presents today for Emergency Department follow-up visit.  She was seen in the emergency department [facility] July 19, 2020 with a chief complaint of chest pain and syncope.  She left the emergency department against medical advice declining admission.  Testing she had in the emergency department consisted of:  TSH   0.200 - 5.000 uIU/mL 2.908  4.606  1.48 R     PREGNANCY, SERUM QUALITATIVE   Negative Negative  Negative     D-DIMER   <0.50 mg/L FEU 0.19     APTT   23.0 - 32.0 seconds 32.0  30.0     PROTHROMBIN TIME   9.0 - 13.0 seconds 10.4  10.3   INR   0.90 - 1.10 1.02  1.01     MAGNESIUM   1.5 - 2.5 mg/dL 2.1     Component   Ref Range & Units 2 d ago   (07/19/20) 1 mo ago   (06/16/20) 2 mo ago   (05/02/20) 2 mo ago   (04/26/20) 6 mo ago   (01/13/20) 1 yr ago   (06/06/19) 1 yr ago   (11/07/18)  SODIUM   136 - 146 mmol/L 138  139  141  139  139  139 R  141 R   POTASSIUM   3.3 - 5.1 mmol/L 3.9  4.4  4.2  4.0  3.9  4.1 R  4.2 R   CHLORIDE   98 - 107 mmol/L 105  105  104  108High  105  107 R  108 R   CO2 TOTAL   21 - 32 mmol/L 23  27  25  22  24  25  R  24 R   ANION GAP   6 - 15 mmol/L 10  7  12  9  10   7.0 R  9.0 R   BUN   7 - 21 mg/dL 9  9  8  7  10  8  R  9 R   CREATININE   0.80 - 1.60 mg/dL    0.69Low  0.88  0.7 R  0.8 R   ESTIMATED GFR   mL/min/1.77m^2 >60  >60  >60  >60  >60   >60 R   ALBUMIN   3.5 - 5.2 g/dL 5.2  4.7  4.6  4.5  4.9  4.2 R  4.7 R   CALCIUM   8.0 - 10.6 mg/dL 9.67  9.6  9.6  8.8  9.6  9.1 R  9.7 R   GLUCOSE   70 - 100 mg/dL 86  93  79  96  92     ALKALINE PHOSPHATASE   41 - 133 U/L 52  52  58  53  62  51 R  64 R   ALT (SGPT)   0 - 55 U/L 22  8   29  13  11  18  R  15 R   AST (SGOT)   5 - 34 U/L 28  12  14  14  14  16  R  17 R  BILIRUBIN TOTAL   0.2 - 1.2 mg/dL 0.8  0.7  0.5  0.3  0.5  0.3 R  0.3 R   PROTEIN TOTAL   6.4 - 8.3 g/dL 8.3  7.4  7.6  6.9  8.2  6.9 R  8.1 R   ALBUMIN/GLOBULIN RATIO   >=1.0 1.7  1.7  1.5  1.9  1.5       Component   Ref Range & Units 2 d ago   (07/19/20) 1 mo ago   (06/16/20) 2 mo ago   (05/02/20) 2 mo ago   (04/30/20) 2 mo ago   (04/26/20) 6 mo ago   (01/13/20) 1 yr ago   (06/06/19)  WBC   3.7 - 11.0 x10^3/uL 7.3  9.7  9.1  5.9  5.6  9.4  10.8 R   RBC   3.85 - 5.22 x10^6/uL 5.01  4.81  5.21  5.22  4.70  5.30High  4.75 R   HGB   11.5 - 16.0 g/dL 16.114.0  09.613.3  04.513.9  40.913.9  12.9  14.0  12.3 R   HCT   34.8 - 46.0 % 42.3  39.8  41.6  43.7  38.6  42.9  37.6 R   MCV   78.0 - 100.0 fL 84.4  82.7  79.8  83.7  82.1  80.9  79.3 R   MCH   26.0 - 32.0 pg 27.9  27.7  26.7  26.6  27.4  26.4  25.9 R   MCHC   31.0 - 35.5 g/dL 81.133.1  91.433.4  78.233.4  95.631.8  33.4  32.6  32.7 R   RDW-CV   11.5 - 15.5 % 14.1  14.3  13.7  13.9  14.2  14.0    PLATELETS   150 - 400 x10^3/uL 281  278  233  229  204  300  288 R   MPV   8.7 - 12.5 fL 12.0  11.3  11.5  12.0  12.0  11.6  9.4 R   NEUTROPHIL %   % 64   67  70  80     LYMPHOCYTE %   % 26   27  24  10      MONOCYTE %   % 7   5  6  8      EOSINOPHIL %   % 1   0  0  1     BASOPHIL %   % 0   0  0  0     NEUTROPHIL #   1.50 - 7.70 x10^3/uL 4.73   6.13  4.04  4.53     LYMPHOCYTE #   1.00 - 4.80 x10^3/uL 1.88   2.46  1.42  0.54Low     MONOCYTE #   0.20 - 1.10 x10^3/uL 0.51   0.47  0.37  0.43     EOSINOPHIL #   <=0.50 x10^3/uL <0.10   <0.10  <0.10  <0.10     BASOPHIL #   <=0.20 x10^3/uL <0.10   <0.10  <0.10  <0.10     IMMATURE GRANULOCYTE %   0 - 1 % 2High   1 CM  0 CM  1 CM       Outpatient wise she has had a Holter monitor completed and those results are as follows:  Interpretation Summary      Clinical history:  Syncope    Findings:  The patient had the monitor for 3 days.  The basic rhythm was  sinus with a heart rate that ranged  between 58 and 182 beats per minute.  The average heart rate during the procedure was 99. Rare supraventricular ectopic beat was seen with no complex forms.  Rare ventricular ectopic beat was seen with no complex forms.  There is no evidence for any AV block or significant sinus node dysfunction.  No significant ST segment changes were seen.    Conclusion:  Rare ventricular ectopic beats with no complex forms    Rare supraventricular ectopic beats with no supraventricular tachycardia    There is nothing on this monitor that would explain syncope    Stress test results are as follows:    Interpretation Summary      .  Normal stress study with the exception of decrease in functional capacity.    This patient has a history of syncope that appears to be classic for vasovagal syncope.  She also has atypical chest discomfort.  She was exercised to her maximal tolerate capacity on the Bruce protocol.  The test was terminated due to fatigue.  She did not have reproducible chest discomfort.  Electrocardiographically, this test is negative.  She was able to complete 10.3 METS.  Her functional capacity is decreased. There were no arrhythmias.  This is a normal stress study with the exception of the decrease in functional capacity.    EKG  Narrative & Impression    Normal sinus rhythm  Normal ECG  No previous ECGs available  Confirmed by Hartley Barefoot ([id number]) on 06/17/2020 4:45:45 PM      Result Data    Result Status Result Component Value Units  Final result (3) Ventricular rate 91 BPM   Atrial Rate 91 BPM   PR Interval 132 ms   QRS Duration 92 ms   QT Interval 348 ms   QTC Calculation 428 ms   Calculated P Axis 40 degrees   Calculated R Axis 89 degrees   Calculated T Axis 23 degrees    INDICATION:  Cough, generalized chest pain.    TECHNIQUE:  Frontal chest was obtained at 12:09 hours.    COMPARISON:  04/07/2020 XR Chest    FINDINGS:    The cardiomediastinal silhouette is normal in size.  There is no consolidation or  atelectasis in either lung.  There are no pleural effusions.  There is no pneumothorax.  No acute osseous process.      IMPRESSION:  No acute process.    Female, 20 years old.    CT BRAIN WO IV CONTRAST performed on 06/16/2020 7:19 AM.    REASON FOR EXAM:  R51.9: Headache  R07.9: Chest pain, unspecified type  R00.0: Tachycardia  Z68.31: BMI 31.0-31.9,adult  R55: Syncope, unspecified syncope type    RADIATION DOSE: 1148.40 mGy.cm    TECHNIQUE: Multiplanar nonenhanced images of the brain    COMPARISON: None    FINDINGS:  No acute intracranial hemorrhage. No mass effect, hydrocephalus or extra-axial fluid collection. Gray-white interface is preserved. Included portions of the paranasal sinuses are well aerated.    IMPRESSION:  No acute intracranial abnormality.    She reports last syncopal episode was Saturday night.  She was told her pulse was 41 by her aunt who is an MA when this happened.  She tells me she has been feeling a little nauseated and feels week since the episode.  She denies vomiting.  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        Current Outpatient Medications   Medication Sig   . famotidine (PEPCID) 20 mg Oral Tablet Take 1 Tablet (20 mg total) by mouth Twice daily   . medroxyPROGESTERone (DEPO-PROVERA) 150 mg/mL IntraMUSCULAR Suspension INJECT 1 ML (150 MG TOTAL) INTRAMUSCULARLY EVERY 3 MONTHS   . ondansetron (ZOFRAN ODT) 4 mg Oral Tablet, Rapid Dissolve Take 1 Tablet (4 mg total) by mouth Every 8 hours as needed for Nausea/Vomiting (Patient taking differently: Take 8 mg by mouth Every 8 hours as needed for Nausea/Vomiting)     Allergies   Allergen Reactions   . Sulfamethoxazole-Trimethoprim Hives/ Urticaria     Past Medical History:   Diagnosis Date   . Anxiety state    . Endometriosis    . Foot fracture     Right   . Gastroesophageal reflux disease  02/19/2014   . HA (headache)    . Hashimoto's thyroiditis    . Hemorrhage of right ovary 02/19/2015   . Hypothyroidism    . Obesity    . Vitamin D deficiency          Past Surgical History:   Procedure Laterality Date   . FOOT SURGERY Right    . GALLBLADDER SURGERY      Cholecystectomy   . HX OTHER  10/30/2015    DIAG LAPARO SEPARATE PROC - fulgration of endometriotic implants   . HX OTHER      ORIF Right foot   . HX TONSIL AND ADENOIDECTOMY     . LAPAROSCOPIC ENDOMETRIOSIS FULGURATION     . LAPAROSCOPIC ENDOMETRIOSIS FULGURATION      2nd surgery was to also drain a blood clot.         Family Medical History:     Problem Relation (Age of Onset)    No Known Problems Mother, Father          Social History     Tobacco Use   . Smoking status: Never Smoker   . Smokeless tobacco: Never Used   Vaping Use   . Vaping Use: Every day   . Substances: Nicotine   . Devices: Pre-filled pod   Substance Use Topics   . Alcohol use: Yes     Comment: occasionally   . Drug use: Yes     Types: Marijuana       Review of Systems   Constitutional: Positive for fatigue.   HENT: Negative.    Eyes: Negative.    Respiratory: Negative.    Cardiovascular: Positive for chest pain.   Gastrointestinal: Positive for nausea.   Endocrine: Negative.    Genitourinary: Negative.    Musculoskeletal: Negative.    Skin: Negative.    Allergic/Immunologic: Negative.    Neurological: Positive for syncope.   Hematological: Negative.    Psychiatric/Behavioral: Negative.      Objective:   Vitals: BP (!) 150/100   Pulse 80   Temp 36.2 C (97.1 F)   Resp 18   Ht 1.575 m (5\' 2" )   Wt 76.7 kg (169 lb)   SpO2 100%   BMI 30.91 kg/m         Physical Exam  Constitutional:       Appearance: Normal appearance.   HENT:      Head: Normocephalic and atraumatic.      Right Ear: Tympanic membrane normal.      Left Ear: Tympanic  membrane normal.      Nose: Nose normal.      Mouth/Throat:      Mouth: Mucous membranes are moist.   Eyes:      Extraocular Movements:  Extraocular movements intact.      Pupils: Pupils are equal, round, and reactive to light.   Cardiovascular:      Rate and Rhythm: Normal rate and regular rhythm.      Pulses: Normal pulses.      Heart sounds: Normal heart sounds.   Pulmonary:      Effort: Pulmonary effort is normal.      Breath sounds: Normal breath sounds.   Abdominal:      Palpations: Abdomen is soft.      Tenderness: There is no abdominal tenderness.   Musculoskeletal:         General: Normal range of motion.      Cervical back: Normal range of motion and neck supple.   Skin:     General: Skin is warm and dry.      Capillary Refill: Capillary refill takes less than 2 seconds.   Neurological:      General: No focal deficit present.      Mental Status: She is alert and oriented to person, place, and time.   Psychiatric:         Mood and Affect: Mood normal.         Behavior: Behavior normal.         Assessment & Plan:      2 ICD-10-CM    1. Chest pain, unspecified type  R07.9 Refer to [facility]   2. Syncope, unspecified syncope type  R55    3. Nausea  R11.0    4. Obesity (BMI 30.0-34.9)  E66.9      Orders Placed This Encounter   . Refer to [facility]   . ondansetron (ZOFRAN ODT) 4 mg Oral Tablet, Rapid Dissolve     The patient does have an appointment scheduled with neurology at [facility] on Aug 24, 2020. She was advised to keep this appointment as scheduled.  She does not have a cardiology appointment as of yet.  A referral will be placed today for Cardiology.  I did send some Zofran to the pharmacy for the patient to help with nausea.  She was advised to take medication as prescribed.  Advised her should there be any more syncopal episodes or chest pain to report to the emergency department and she verbalized understanding.    [employer], CRNP

## 2020-07-21 NOTE — Nursing Note (Signed)
Pt is here for ED follow up for passing out. Pt states she feels weak. Pt states she has been vomiting  X 4 days

## 2020-07-22 LAB — ECG 12 LEAD
Atrial Rate: 77 {beats}/min
Calculated R Axis: 80 degrees
PR Interval: 148 ms
QRS Duration: 84 ms
QT Interval: 366 ms
QTC Calculation: 414 ms
Ventricular rate: 77 {beats}/min

## 2020-07-23 ENCOUNTER — Ambulatory Visit (INDEPENDENT_AMBULATORY_CARE_PROVIDER_SITE_OTHER): Payer: Self-pay | Admitting: Family Medicine

## 2020-07-23 NOTE — Telephone Encounter (Signed)
Patient is aware and verbally understands.

## 2020-07-23 NOTE — Telephone Encounter (Signed)
Patient states 8mg  Zofran ineffective. States pepto bismol is usually more effective for her. She is asking if she can take pepto and Zofran together.   Please advise

## 2020-07-23 NOTE — Telephone Encounter (Signed)
Regarding: medication question   ----- Message from Marko Stai sent at 07/23/2020  9:27 AM EDT -----  Peter Minium, MD  Pt si wanting to speak with the nurse she wants to see if she cab take pepto bismol  And ondansetron [facility] ODT please call back advise              Thank You Rene Kocher

## 2020-07-23 NOTE — Telephone Encounter (Signed)
There are no interactions, she may take them together.  Thank you.

## 2020-08-24 ENCOUNTER — Encounter (INDEPENDENT_AMBULATORY_CARE_PROVIDER_SITE_OTHER): Payer: No Typology Code available for payment source | Admitting: NURSE PRACTITIONER

## 2020-08-25 ENCOUNTER — Other Ambulatory Visit (HOSPITAL_COMMUNITY): Payer: Self-pay

## 2020-08-25 DIAGNOSIS — R4020 Unspecified coma: Secondary | ICD-10-CM

## 2020-09-01 ENCOUNTER — Ambulatory Visit: Payer: No Typology Code available for payment source | Attending: NURSE PRACTITIONER | Admitting: Family

## 2020-09-01 ENCOUNTER — Encounter (INDEPENDENT_AMBULATORY_CARE_PROVIDER_SITE_OTHER): Payer: Self-pay | Admitting: Family

## 2020-09-01 ENCOUNTER — Other Ambulatory Visit: Payer: Self-pay

## 2020-09-01 VITALS — BP 118/72 | HR 91 | Ht 62.0 in | Wt 168.0 lb

## 2020-09-01 DIAGNOSIS — R002 Palpitations: Secondary | ICD-10-CM | POA: Insufficient documentation

## 2020-09-01 DIAGNOSIS — I498 Other specified cardiac arrhythmias: Secondary | ICD-10-CM

## 2020-09-01 DIAGNOSIS — R55 Syncope and collapse: Secondary | ICD-10-CM

## 2020-09-01 DIAGNOSIS — R0789 Other chest pain: Secondary | ICD-10-CM

## 2020-09-01 DIAGNOSIS — E663 Overweight: Secondary | ICD-10-CM

## 2020-09-01 DIAGNOSIS — R0602 Shortness of breath: Secondary | ICD-10-CM

## 2020-09-01 NOTE — Progress Notes (Signed)
Traci [name] is a 20 y.o. female seen in the office on 09/01/2020    Chief Complaint   Patient presents with   . New Patient   . Chest Pain       History of Present Illness:  This is a 20 year old female who presents our office as a new patient referred by her PCP.  The patient had been in the emergency room March of 2022 with complaints of chest pain and syncope.  Patient was ordered an exercise stress test by her PCP that showed a normal stress study with the exception of decrease in functional capacity.  Patient has a history of syncope that appears to be classic for vasovagal syncope.  She also has atypical chest discomfort.  She was exercised to her maximal tolerate capacity on the Bruce protocol.  Test was terminated due to fatigue.  She did not have reproducible chest discomfort.  Electrocardiographically the test is negative.  She was able to complete 10.3 Mets.  Functional capacity is decreased.  There were no arrhythmias.  Patient also had an event monitor that showed rare ventricular ectopic beats with no complex forms.  Rare supraventricular ectopic beats with no supraventricular tachycardia.  Nothing on the monitor that would explain syncope.  History includes syncope, migraines, endometriosis, anxiety, GERD, Hashimoto's thyroiditis, tobacco use, marijuana use, hypothyroidism and obesity.  As far as the patient has hypothyroidism, she was diagnosed at the age of 67 and was on Synthroid until about the age of 48 when her numbers regulated.  She has thyroid studies annually and these have been normal.  Family history consistent with hypertension and CHF in her maternal grandmother passed away, maternal uncle with atrial fibrillation and mother with hypertension.  The patient does vape but tell me she is trying to cut down.  She also smokes marijuana.  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    She presents to the office today telling me she has had multiple episodes of syncope.  She reports this  starting with a fluttering sensation in her chest as well as a heaviness that turns into a flushing feeling causing her to feel as though she is going to pass out.  This can occur in random times and there is no specific activity to relate.  She does report shortness of breath more so with activity.  She does tell me the symptoms began around February of 2022. She denies ever having COVID-19 nor has she gotten vaccinated.  She was recently seen by Neurology and does tell me she also has headaches when she has this near-syncope.  Her vital signs are stable.      Current Outpatient Medications   Medication Sig   . famotidine (PEPCID) 20 mg Oral Tablet Take 1 Tablet (20 mg total) by mouth Twice daily   . medroxyPROGESTERone (DEPO-PROVERA) 150 mg/mL IntraMUSCULAR Suspension INJECT 1 ML (150 MG TOTAL) INTRAMUSCULARLY EVERY 3 MONTHS   . ondansetron (ZOFRAN ODT) 4 mg Oral Tablet, Rapid Dissolve Take 1 Tablet (4 mg total) by mouth Every 8 hours as needed for Nausea/Vomiting   . topiramate (TOPAMAX) 50 mg Oral Tablet Take 50 mg by mouth Every night       Allergies   Allergen Reactions   . Sulfamethoxazole-Trimethoprim Hives/ Urticaria     Family Medical History:     Problem Relation (Age of Onset)    Alopecia Sister    COLITIS Brother    Hypertension (High Blood Pressure) Mother  Migraines Mother    No Known Problems Father    Thyroid Disease Mother        Maternal grandmother with history of CHF and hypertension.  Maternal uncle with atrial fibrillation hypertension.  Mother with hypertension.  No family member who passed away at a young age of unknown cause.    Social History     Socioeconomic History   . Marital status: Single   Tobacco Use   . Smoking status: Never Smoker   . Smokeless tobacco: Never Used   Vaping Use   . Vaping Use: Every day   . Substances: Nicotine   . Devices: Pre-filled pod   Substance and Sexual Activity   . Alcohol use: Yes     Comment: occasionally   . Drug use: Yes     Types: Marijuana   .  Sexual activity: Yes     Birth control/protection: Condom       Review of Systems:   Constitutional:  The patient denies fevers, chills, weight gain or loss.  She does tell me she finds it difficult to function regularly as she has episodes where she feels as though she is going to pass out and has shortness of breath.  HEENT:  The patient does report headache.  Denies vision disturbance, nose bleeds, ulcerations gingivitis, pharyngitis.  Respiratory:  Denies asthma, no COPD, no cough, sputum production, hemoptysis, wheezing.  Patient does report shortness of breath with activity.  Cardiovascular :  Patient denies history of CAD, MI, CABG, orthopnea, PND, or edema.  Gastrointestinal:  Patient denies nausea, vomiting, diarrhea, blood in stool.  Patient does report having issues with constipation over the last 6 months.  Genitourinary:  Patient denies urgency, frequency, retention, difficulty with stream, hematuria, dysuria.  Musculoskeletal:  Patient denies arthritis or myalgias.  Neurologic:  No stroke, TIA, paralysis, seizures or tremors.  Psychiatric:  Patient does have a history of anxiety.  Endocrine:  Patient is not diabetic.  Denies polyuria, polydipsia, polyphagia, heat or cold intolerance.  Integumentary:  The patient denies open areas, rashes, or color changes.    Physical Exam:  BP 118/72   Pulse 91   Ht 1.575 m (5\' 2" )   Wt 76.2 kg (168 lb)   SpO2 99%   BMI 30.73 kg/m       Body mass index is 30.73 kg/m.  Wt Readings from Last 5 Encounters:   09/01/20 76.2 kg (168 lb)   07/21/20 76.7 kg (169 lb)   07/19/20 76.2 kg (168 lb)   06/15/20 78.7 kg (173 lb 9.6 oz)   05/01/20 78.1 kg (172 lb 2.9 oz)       General Appearance: well appearing, well nourished, in no apparent distress.  Head: normocephalic/atraumatic.  Eyes: Sclera nonicteric; conjunctiva normal.  Neck: No jugular venous distention. The carotid upstroke is normal and no bruit is heard.  Cardiovascular: There is no lift or thrill. The rhythm is  regular. No significant murmur, gallop, rub, or click is heard.  Respiratory: lungs CTA bilaterally, no wheezes, rales, rhonchi.  Gastrointestinal: abdomen soft and non-tender, no organomegaly or palpable masses; the aorta is not palpable.  Musculoskeletal: no kyphosis or scoliosis, gait appears normal, muscle strength was normal with no evidence of muscular atrophy.  Skin: no evidence of stasis, dermatitis or ulcers.  Neurological: oriented x3 with normal effect.  Psychiatric: speech, memory, cognition intact.  Hematologic/Lymphatic: no clubbing, cyanosis or edema; peripheral pulses are intact.    PERTINENT DIAGNOSTICS:  EKG today showed normal  sinus rhythm/sinus arrhythmia with a rate of 76. Most recent blood work showed TSH 2.908.  Magnesium 2.1.  Sodium 138, potassium 3.9, BUN 9, creatinine 0.76, glucose 86, ALT 22, AST 28.     IMPRESSION:  1. Other chest pain    2. Recurrent syncope    3. Overweight (BMI 25.0-29.9)    4. Palpitations    5. Shortness of breath          PLAN:  This patient presents to the office today as a new patient referred by her PCP.  She presents today with continued episodes of syncope, chest discomfort and palpitations.  She did undergo stress testing that returned normal.  She also had an event monitor that showed no significant arrhythmia or any cause for her symptoms.  I will order an echocardiogram to strictly rule out any cardiac issue.  She was seen by Neurology and does have a history of migraines.  They would like to place her on medication and from a cardiac standpoint that is okay.  Her vital signs are stable.  I do not think her symptoms are cardiac related but again we will wait to see what echocardiogram shows.  She will follow with our office on an as-needed basis unless there is any significant abnormality noted on echo.  She will contact our office with any further questions or concerns.  This note will be forwarded to Dr. Francie Massing for review.  He was readily available for  discussion regarding this patient.    Orders Placed This Encounter   . ECG - ADD ON IN CLINIC - SAME DAY   . TRANSTHORACIC ECHOCARDIOGRAM - ADULT

## 2020-09-02 LAB — ECG W INTERP (CLINIC ONLY)
Atrial Rate: 76 {beats}/min
Calculated P Axis: 13 degrees
Calculated R Axis: 67 degrees
PR Interval: 146 ms
QRS Duration: 92 ms
QT Interval: 360 ms
QTC Calculation: 405 ms
Ventricular rate: 76 {beats}/min

## 2020-09-03 ENCOUNTER — Ambulatory Visit (INDEPENDENT_AMBULATORY_CARE_PROVIDER_SITE_OTHER): Payer: Self-pay | Admitting: Family

## 2020-09-07 ENCOUNTER — Other Ambulatory Visit: Payer: Self-pay

## 2020-09-07 ENCOUNTER — Ambulatory Visit: Payer: No Typology Code available for payment source

## 2020-09-07 DIAGNOSIS — E559 Vitamin D deficiency, unspecified: Secondary | ICD-10-CM | POA: Insufficient documentation

## 2020-09-07 DIAGNOSIS — E611 Iron deficiency: Secondary | ICD-10-CM | POA: Insufficient documentation

## 2020-09-07 DIAGNOSIS — E538 Deficiency of other specified B group vitamins: Secondary | ICD-10-CM | POA: Insufficient documentation

## 2020-09-07 LAB — FERRITIN: FERRITIN: 22 ng/mL (ref 11–307)

## 2020-09-07 LAB — FOLATE: FOLATE: 9.8 ng/mL (ref 5.9–24.8)

## 2020-09-07 LAB — VITAMIN B12: VITAMIN B 12: 193 pg/mL (ref 180–914)

## 2020-09-07 LAB — IRON TRANSFERRIN AND TIBC
IRON (TRANSFERRIN) SATURATION: 23 % (ref 20–55)
IRON: 89 ug/dL (ref 35–150)
TOTAL IRON BINDING CAPACITY: 379 ug/dL (ref 260–445)
UIBC: 290 ug/dL (ref 155–355)

## 2020-09-08 LAB — IRON AND TOTAL IRON BINDING CAPACITY
IRON (TRANSFERRIN) SATURATION: 23 %
IRON: 89
TOTAL IRON BINDING CAPACITY: 379 ug/dL
UIBC: 290 ug/dL

## 2020-09-08 LAB — VITAMIN D 25 TOTAL: VITAMIN D,25-OH,TOTAL,IA: 29 ng/mL — ABNORMAL LOW (ref 30–100)

## 2020-09-08 LAB — VITAMIN B12: VITAMIN B12: 193

## 2020-09-08 LAB — FERRITIN: FERRITIN: 22

## 2020-09-08 LAB — VITAMIN D 25 HYDROXY: VITAMIN D 25 HYDROXY: 29

## 2020-09-09 ENCOUNTER — Ambulatory Visit (INDEPENDENT_AMBULATORY_CARE_PROVIDER_SITE_OTHER): Payer: Self-pay | Admitting: Family Medicine

## 2020-09-09 ENCOUNTER — Encounter (HOSPITAL_COMMUNITY): Payer: Self-pay

## 2020-09-09 NOTE — Telephone Encounter (Signed)
Regarding: referral  ----- Message from Marden Noble sent at 09/09/2020 11:41 AM EDT -----  Peter Minium, MD    Pt requesting to have a printed copy of GI referral that can be picked up at office.  Please advise    Thanks  Marden Noble

## 2020-09-09 NOTE — Nursing Note (Signed)
Called and left VM to let the patient know she can come in and pick of a copy of the referral in the office today.

## 2020-09-10 ENCOUNTER — Telehealth (INDEPENDENT_AMBULATORY_CARE_PROVIDER_SITE_OTHER): Payer: Self-pay | Admitting: Family Medicine

## 2020-09-10 ENCOUNTER — Telehealth (INDEPENDENT_AMBULATORY_CARE_PROVIDER_SITE_OTHER): Payer: Self-pay | Admitting: NURSE PRACTITIONER

## 2020-09-10 ENCOUNTER — Other Ambulatory Visit (INDEPENDENT_AMBULATORY_CARE_PROVIDER_SITE_OTHER): Payer: Self-pay | Admitting: Family Medicine

## 2020-09-10 ENCOUNTER — Encounter (INDEPENDENT_AMBULATORY_CARE_PROVIDER_SITE_OTHER): Payer: Self-pay | Admitting: NURSE PRACTITIONER

## 2020-09-10 ENCOUNTER — Ambulatory Visit
Admission: RE | Admit: 2020-09-10 | Discharge: 2020-09-10 | Disposition: A | Discharge status: Home / Self Care | Payer: No Typology Code available for payment source | Source: Ambulatory Visit | Attending: NURSE PRACTITIONER | Admitting: NURSE PRACTITIONER

## 2020-09-10 ENCOUNTER — Other Ambulatory Visit: Payer: Self-pay

## 2020-09-10 ENCOUNTER — Ambulatory Visit (INDEPENDENT_AMBULATORY_CARE_PROVIDER_SITE_OTHER): Payer: No Typology Code available for payment source | Admitting: NURSE PRACTITIONER

## 2020-09-10 ENCOUNTER — Ambulatory Visit (HOSPITAL_BASED_OUTPATIENT_CLINIC_OR_DEPARTMENT_OTHER): Payer: No Typology Code available for payment source

## 2020-09-10 ENCOUNTER — Encounter (INDEPENDENT_AMBULATORY_CARE_PROVIDER_SITE_OTHER): Payer: Self-pay | Admitting: Family Medicine

## 2020-09-10 VITALS — BP 118/70 | HR 74 | Temp 97.3°F | Ht 62.0 in | Wt 165.0 lb

## 2020-09-10 DIAGNOSIS — R112 Nausea with vomiting, unspecified: Secondary | ICD-10-CM

## 2020-09-10 DIAGNOSIS — N3020 Other chronic cystitis without hematuria: Secondary | ICD-10-CM

## 2020-09-10 DIAGNOSIS — K5900 Constipation, unspecified: Secondary | ICD-10-CM

## 2020-09-10 DIAGNOSIS — N390 Urinary tract infection, site not specified: Secondary | ICD-10-CM | POA: Insufficient documentation

## 2020-09-10 DIAGNOSIS — E559 Vitamin D deficiency, unspecified: Secondary | ICD-10-CM

## 2020-09-10 DIAGNOSIS — E538 Deficiency of other specified B group vitamins: Secondary | ICD-10-CM

## 2020-09-10 DIAGNOSIS — Z6830 Body mass index (BMI) 30.0-30.9, adult: Secondary | ICD-10-CM

## 2020-09-10 LAB — URINALYSIS, MICROSCOPIC: BACTERIA: NEGATIVE /hpf

## 2020-09-10 LAB — URINALYSIS, MACRO/MICRO
BILIRUBIN: NEGATIVE mg/dL
BLOOD: NEGATIVE mg/dL
GLUCOSE: NEGATIVE mg/dL
KETONES: NEGATIVE mg/dL
LEUKOCYTES: NEGATIVE WBCs/uL
NITRITE: NEGATIVE
PH: 6 (ref 5.0–9.0)
PROTEIN: NEGATIVE mg/dL
SPECIFIC GRAVITY: 1.023 (ref 1.001–1.030)
UROBILINOGEN: 0.2 mg/dL (ref 0.2–1.0)

## 2020-09-10 MED ORDER — CYANOCOBALAMIN (VIT B-12) 1,000 MCG/ML INJECTION SOLUTION
1000.0000 ug | INTRAMUSCULAR | 3 refills | Status: AC
Start: 2020-09-10 — End: 2021-09-10

## 2020-09-10 MED ORDER — MONOJECT 3CC SYRINGE 3 ML 25 GAUGE X 1"
INJECTION | 1 refills | Status: DC
Start: 2020-09-10 — End: 2022-03-02

## 2020-09-10 MED ORDER — MAGNESIUM CITRATE ORAL SOLUTION
296.0000 mL | Freq: Once | ORAL | 0 refills | Status: AC
Start: 2020-09-10 — End: 2020-09-10

## 2020-09-10 MED ORDER — GLYCERIN (ADULT) RECTAL SUPPOSITORY
1.0000 | Freq: Once | RECTAL | 0 refills | Status: AC
Start: 2020-09-10 — End: 2020-09-10

## 2020-09-10 MED ORDER — CHOLECALCIFEROL (VITAMIN D3) 50 MCG (2,000 UNIT) CAPSULE
1.0000 | ORAL_CAPSULE | Freq: Every day | ORAL | 3 refills | Status: DC
Start: 2020-09-10 — End: 2022-06-20

## 2020-09-10 NOTE — Telephone Encounter (Signed)
She may get the B 12 from her pharmacy and please come in the office and give the shot. Thank you

## 2020-09-10 NOTE — Telephone Encounter (Signed)
Pt aware.

## 2020-09-10 NOTE — Progress Notes (Signed)
PRIMARY CARE, [facility]  [address] [place] [address]  Phone: [phone]  Fax: [phone]    Encounter Date: 09/10/2020    Patient ID:  Traci [name]  GBE:[id number]    DOB: 12-09-2000  Age: 20 y.o. female    Subjective:     Chief Complaint   Patient presents with   . Constipation     The patient presents today for an acute appointment.  She is complaining of constipation.  She says she is having some abdominal bloating and discomfort.  She tells me it has been approximately 8 days since her last bowel movement.  There is nausea and vomiting.  Last episode of emesis today in exam room.  She does have chronic abdominal pain and nausea and follow regularly with GI.  She typically does not have issues with not being able to have BM's.  She has tried OTC stool softener (dulcolax) without relief.  There has been no fever or chills.          Current Outpatient Medications   Medication Sig   . famotidine (PEPCID) 20 mg Oral Tablet Take 1 Tablet (20 mg total) by mouth Twice daily   . medroxyPROGESTERone (DEPO-PROVERA) 150 mg/mL IntraMUSCULAR Suspension INJECT 1 ML (150 MG TOTAL) INTRAMUSCULARLY EVERY 3 MONTHS   . ondansetron (ZOFRAN ODT) 4 mg Oral Tablet, Rapid Dissolve Take 1 Tablet (4 mg total) by mouth Every 8 hours as needed for Nausea/Vomiting   . topiramate (TOPAMAX) 50 mg Oral Tablet Take 50 mg by mouth Every night     Allergies   Allergen Reactions   . Sulfamethoxazole-Trimethoprim Hives/ Urticaria     Past Medical History:   Diagnosis Date   . Anxiety state    . Endometriosis    . Foot fracture     Right   . Gastroesophageal reflux disease 02/19/2014   . HA (headache)    . Hashimoto's thyroiditis    . Hemorrhage of right ovary 02/19/2015   . Hypothyroidism    . Obesity    . Vitamin D deficiency          Past Surgical History:   Procedure Laterality Date   . FOOT SURGERY Right    . GALLBLADDER SURGERY      Cholecystectomy   . HX OTHER  10/30/2015    DIAG LAPARO SEPARATE PROC -  fulgration of endometriotic implants   . HX OTHER      ORIF Right foot   . HX TONSIL AND ADENOIDECTOMY     . HX TONSILLECTOMY     . LAPAROSCOPIC ENDOMETRIOSIS FULGURATION     . LAPAROSCOPIC ENDOMETRIOSIS FULGURATION      2nd surgery was to also drain a blood clot.         Family Medical History:     Problem Relation (Age of Onset)    Alopecia Sister    COLITIS Brother    Hypertension (High Blood Pressure) Mother    Migraines Mother    No Known Problems Father    Thyroid Disease Mother          Social History     Tobacco Use   . Smoking status: Never Smoker   . Smokeless tobacco: Never Used   Vaping Use   . Vaping Use: Every day   . Substances: Nicotine   . Devices: Pre-filled pod   Substance Use Topics   . Alcohol use: Yes     Comment: occasionally   .  Drug use: Yes     Types: Marijuana       Review of Systems   Constitutional: Negative.    HENT: Negative.    Eyes: Negative.    Respiratory: Negative.    Cardiovascular: Negative.    Gastrointestinal: Positive for constipation, nausea and vomiting.   Endocrine: Negative.    Genitourinary: Negative.    Musculoskeletal: Negative.    Skin: Negative.    Neurological: Negative.    Hematological: Negative.    Psychiatric/Behavioral: Negative.      Objective:   Vitals: BP 118/70   Pulse 74   Temp 36.3 C (97.3 F)   Ht 1.575 m (5\' 2" )   Wt 74.8 kg (165 lb)   SpO2 98%   BMI 30.18 kg/m         Physical Exam  Constitutional:       Appearance: Normal appearance.   HENT:      Head: Normocephalic and atraumatic.      Right Ear: Tympanic membrane normal.      Left Ear: Tympanic membrane normal.      Nose: Nose normal.      Mouth/Throat:      Mouth: Mucous membranes are moist.   Eyes:      Extraocular Movements: Extraocular movements intact.      Pupils: Pupils are equal, round, and reactive to light.   Cardiovascular:      Rate and Rhythm: Normal rate and regular rhythm.      Pulses: Normal pulses.      Heart sounds: Normal heart sounds.   Pulmonary:      Breath sounds:  Normal breath sounds.   Abdominal:      General: Bowel sounds are normal. There is no distension.      Palpations: Abdomen is soft.      Tenderness: There is no abdominal tenderness. There is no guarding.   Musculoskeletal:         General: Normal range of motion.      Cervical back: Normal range of motion and neck supple.   Skin:     General: Skin is warm and dry.      Capillary Refill: Capillary refill takes less than 2 seconds.   Neurological:      General: No focal deficit present.      Mental Status: She is alert and oriented to person, place, and time.   Psychiatric:         Mood and Affect: Mood normal.         Behavior: Behavior normal.         Assessment & Plan:      2 ICD-10-CM    1. Constipation, unspecified constipation type  K59.00 KUB Xray   2. Nausea & vomiting  R11.2    3. BMI 30.0-30.9,adult  Z68.30      Orders Placed This Encounter   . KUB Xray   . magnesium citrate (CITROMA) Oral Solution   . glycerin, adult, (SANI-SUPP) Rectal Suppository     Stat abdominal x-ray ordered for the patient.  She was advised to have x-ray completed as ordered.  Further determination be based on results.  The patient does have bowel sounds present x4 quadrants.  She was however advised not to use Mag citrate and suppository until once x-ray results are read.  Advised patient she may continue taking her Zofran as needed for nausea.  She was advised should vomiting continue, she not be able to keep anything down, abdominal pain  worsens at all to report to the emergency department she verbalized understanding.    [employer], CRNP

## 2020-09-10 NOTE — Telephone Encounter (Signed)
-----   Message from Peter Minium, MD sent at 09/10/2020  5:46 PM EDT -----  Is she willing to get B 12 shots? Will send if willing to get in the office. Thank you.

## 2020-09-10 NOTE — Telephone Encounter (Signed)
Aunt may give, sent syringes also. Thank you.

## 2020-09-10 NOTE — Telephone Encounter (Signed)
-----   Message from Peter Minium, MD sent at 09/10/2020  5:49 PM EDT -----  Vitamin D level low. May take 2,000 units daily. Thank you.

## 2020-09-10 NOTE — Telephone Encounter (Signed)
Pt made aware xray was normal and to take medication as prescribed at appt

## 2020-09-10 NOTE — Telephone Encounter (Signed)
Please advise patient her stat abdominal x-ray shows no obstruction or ileus.  She may take medications as prescribed today it visit.  New.

## 2020-09-10 NOTE — Telephone Encounter (Signed)
Pt is willing to try b12 shot. Please advise

## 2020-09-10 NOTE — Telephone Encounter (Signed)
Pt aware. Pt states her aunt is a nurse she would for her to give the b12 injection. Please advise

## 2020-09-11 LAB — URINE CULTURE,ROUTINE: URINE CULTURE: 2000 — AB

## 2020-09-14 ENCOUNTER — Emergency Department
Admission: EM | Admit: 2020-09-14 | Discharge: 2020-09-14 | Disposition: A | Discharge status: Home / Self Care | Payer: No Typology Code available for payment source | Attending: Emergency Medicine | Admitting: Emergency Medicine

## 2020-09-14 ENCOUNTER — Encounter (HOSPITAL_COMMUNITY): Payer: Self-pay

## 2020-09-14 ENCOUNTER — Emergency Department (HOSPITAL_COMMUNITY): Payer: No Typology Code available for payment source

## 2020-09-14 ENCOUNTER — Other Ambulatory Visit: Payer: Self-pay

## 2020-09-14 DIAGNOSIS — Z8742 Personal history of other diseases of the female genital tract: Secondary | ICD-10-CM | POA: Insufficient documentation

## 2020-09-14 DIAGNOSIS — R109 Unspecified abdominal pain: Secondary | ICD-10-CM

## 2020-09-14 DIAGNOSIS — K5900 Constipation, unspecified: Secondary | ICD-10-CM | POA: Insufficient documentation

## 2020-09-14 DIAGNOSIS — Z9049 Acquired absence of other specified parts of digestive tract: Secondary | ICD-10-CM | POA: Insufficient documentation

## 2020-09-14 DIAGNOSIS — G8929 Other chronic pain: Secondary | ICD-10-CM | POA: Insufficient documentation

## 2020-09-14 DIAGNOSIS — Z881 Allergy status to other antibiotic agents status: Secondary | ICD-10-CM

## 2020-09-14 DIAGNOSIS — E079 Disorder of thyroid, unspecified: Secondary | ICD-10-CM | POA: Insufficient documentation

## 2020-09-14 DIAGNOSIS — F419 Anxiety disorder, unspecified: Secondary | ICD-10-CM | POA: Insufficient documentation

## 2020-09-14 DIAGNOSIS — F17290 Nicotine dependence, other tobacco product, uncomplicated: Secondary | ICD-10-CM | POA: Insufficient documentation

## 2020-09-14 DIAGNOSIS — K219 Gastro-esophageal reflux disease without esophagitis: Secondary | ICD-10-CM | POA: Insufficient documentation

## 2020-09-14 DIAGNOSIS — R1010 Upper abdominal pain, unspecified: Secondary | ICD-10-CM | POA: Insufficient documentation

## 2020-09-14 DIAGNOSIS — R112 Nausea with vomiting, unspecified: Secondary | ICD-10-CM | POA: Insufficient documentation

## 2020-09-14 LAB — URINALYSIS, MACRO/MICRO
BILIRUBIN: NEGATIVE mg/dL
BLOOD: NEGATIVE mg/dL
GLUCOSE: NEGATIVE mg/dL
LEUKOCYTES: NEGATIVE WBCs/uL
NITRITE: NEGATIVE
PH: 5.5 (ref 5.0–9.0)
PROTEIN: NEGATIVE mg/dL
SPECIFIC GRAVITY: 1.023 (ref 1.001–1.030)
UROBILINOGEN: 0.2 mg/dL (ref 0.2–1.0)

## 2020-09-14 LAB — HCG, URINE QUALITATIVE, PREGNANCY
HCG URINE QUALITATIVE: NEGATIVE
SPECIFIC GRAVITY: 1.02 (ref 1.000–1.030)

## 2020-09-14 MED ORDER — LACTATED RINGERS IV BOLUS
1000.0000 mL | INJECTION | Status: AC
Start: 2020-09-14 — End: 2020-09-14
  Administered 2020-09-14: 1000 mL via INTRAVENOUS
  Administered 2020-09-14: 0 mL via INTRAVENOUS

## 2020-09-14 MED ORDER — ONDANSETRON HCL (PF) 4 MG/2 ML INJECTION SOLUTION
4.0000 mg | INTRAMUSCULAR | Status: AC
Start: 2020-09-14 — End: 2020-09-14
  Administered 2020-09-14: 4 mg via INTRAVENOUS
  Filled 2020-09-14: qty 2

## 2020-09-14 NOTE — ED Provider Notes (Signed)
EMERGENCY MEDICINE NOTE    Patient Traci [name] Age 20 y.o. Sex female  MRN [id number]  Encounter Date 09/14/20    ED Impression  1. Chronic abdominal pain        MDM  Presents with upper abdominal pain in the setting of constipation. Afebrile. VSS. Exam reveals nontoxic appearing patient in no acute distress with mild upper abdominal TTP that reproduces complaint, no guarding, no rebound. Labs reveal unremarkable UA negative UCG. Imaging reveals nonspecific nonobstructive bowel gas pattern with no free air and no acute pulmonary process.  No clear etiology at the time of this report.  That said, given the chronicity of this patient's symptoms and the lack of any red flags or high risk features okay for discharge home with instructions for follow-up and signs and symptoms that would warrant return to the emergency department.    This case represents 0 minutes of critical care time, not including time spent performing separately billed services.    CC  Chief Complaint   Patient presents with   . Abdominal Pain       HPI  PMH anxiety, thyroid disease, endometriosis. GERD and chronic abdominal pain with nausea vomiting on Zofran at home being followed by GI with PSH significant for cholecystectomy C/O gradual onset constipation of fluctuating intensity over the past 7-10 days associated nausea/vomiting only with solid foods and sharp crampy constant nonradiating upper abdominal pain of fluctuating intensity persistent over the past 6 days despite treatment with magnesium citrate from PCP and reported relief of constipation. Denies fever, rash, joint pain/swelling, hearing changes, vision changes, headache, shortness of breath, chest pain and changes in bladder habits.    ROS    ALLERGIES  Allergies   Allergen Reactions   . Sulfamethoxazole-Trimethoprim Hives/ Urticaria       MEDICATIONS  Patient's Medications   New Prescriptions    No medications on file   Previous Medications    CHOLECALCIFEROL, VITAMIN D3, (VITAMIN  D-3) 50 MCG (2,000 UNIT) ORAL CAPSULE    Take 1 Capsule by mouth Once a day    CYANOCOBALAMIN (VITAMIN B12) 1,000 MCG/ML INJECTION SOLUTION    Inject 1 mL (1,000 mcg total) under the skin Every 30 days    FAMOTIDINE (PEPCID) 20 MG ORAL TABLET    Take 1 Tablet (20 mg total) by mouth Twice daily    MEDROXYPROGESTERONE (DEPO-PROVERA) 150 MG/ML INTRAMUSCULAR SUSPENSION    INJECT 1 ML (150 MG TOTAL) INTRAMUSCULARLY EVERY 3 MONTHS    ONDANSETRON (ZOFRAN ODT) 4 MG ORAL TABLET, RAPID DISSOLVE    Take 1 Tablet (4 mg total) by mouth Every 8 hours as needed for Nausea/Vomiting    SYRINGE WITH NEEDLE, DISP, (MONOJECT 3CC SYR 25GX1") 3 ML 25 GAUGE X 1" SYRINGE    Use once a month    TOPIRAMATE (TOPAMAX) 50 MG ORAL TABLET    Take 50 mg by mouth Every night   Modified Medications    No medications on file   Discontinued Medications    No medications on file       PMH  Past Medical History:   Diagnosis Date   . Anxiety state    . Endometriosis    . Foot fracture     Right   . Gastroesophageal reflux disease 02/19/2014   . HA (headache)    . Hashimoto's thyroiditis    . Hemorrhage of right ovary 02/19/2015   . Hypothyroidism    . Obesity    . Vitamin D deficiency  PSH  Past Surgical History:   Procedure Laterality Date   . FOOT SURGERY Right    . GALLBLADDER SURGERY      Cholecystectomy   . HX OTHER  10/30/2015    DIAG LAPARO SEPARATE PROC - fulgration of endometriotic implants   . HX OTHER      ORIF Right foot   . HX TONSIL AND ADENOIDECTOMY     . HX TONSILLECTOMY     . LAPAROSCOPIC ENDOMETRIOSIS FULGURATION     . LAPAROSCOPIC ENDOMETRIOSIS FULGURATION      2nd surgery was to also drain a blood clot.           FH  Family Medical History:     Problem Relation (Age of Onset)    Alopecia Sister    COLITIS Brother    Hypertension (High Blood Pressure) Mother    Migraines Mother    No Known Problems Father    Thyroid Disease Mother            SH  Social History     Socioeconomic History   . Marital status: Single     Spouse  name: Not on file   . Number of children: Not on file   . Years of education: Not on file   . Highest education level: Not on file   Occupational History   . Not on file   Tobacco Use   . Smoking status: Never Smoker   . Smokeless tobacco: Current User   Vaping Use   . Vaping Use: Every day   . Substances: Nicotine   . Devices: Pre-filled pod   Substance and Sexual Activity   . Alcohol use: Yes     Comment: occasionally   . Drug use: Yes     Types: Marijuana   . Sexual activity: Yes     Birth control/protection: Condom   Other Topics Concern   . Not on file   Social History Narrative   . Not on file     Social Determinants of Health     Financial Resource Strain: Not on file   Food Insecurity: Not on file   Transportation Needs: Not on file   Physical Activity: Not on file   Stress: Not on file   Intimate Partner Violence: Not on file   Housing Stability: Not on file       EXAM  Vitals:    09/14/20 0559 09/14/20 0757   BP: (!) 130/103 124/70   Pulse: (!) 114 72   Resp: 20 18   Temp: 35 C (95 F)    SpO2: 100% 100%   Weight: 75.1 kg (165 lb 9.1 oz)          Physical Exam  Vitals and nursing note reviewed.   Constitutional:       Appearance: Normal appearance.   HENT:      Head: Normocephalic and atraumatic.      Mouth/Throat:      Mouth: Mucous membranes are moist.      Pharynx: Oropharynx is clear.   Eyes:      Extraocular Movements: Extraocular movements intact.      Conjunctiva/sclera: Conjunctivae normal.   Pulmonary:      Effort: Pulmonary effort is normal. No respiratory distress.   Abdominal:      Palpations: Abdomen is soft.      Tenderness: There is abdominal tenderness.   Musculoskeletal:         General: No deformity. Normal range of  motion.      Cervical back: Normal range of motion and neck supple.   Skin:     General: Skin is warm and dry.   Neurological:      General: No focal deficit present.      Mental Status: She is alert. Mental status is at baseline.   Psychiatric:         Mood and Affect: Mood  normal.         Behavior: Behavior normal.         LABS  Results for orders placed or performed during the hospital encounter of 09/14/20 (from the past 12 hour(s))   HCG, URINE QUALITATIVE, PREGNANCY   Result Value Ref Range    HCG URINE QUALITATIVE Negative Negative    SPECIFIC GRAVITY 1.020 1.000 - 1.030   URINALYSIS, MACRO/MICRO   Result Value Ref Range    COLOR Yellow Yellow    APPEARANCE Clear Clear    SPECIFIC GRAVITY 1.023 1.001 - 1.030    PH 5.5 5.0 - 9.0    PROTEIN Negative Negative mg/dL    GLUCOSE Negative Negative mg/dL    KETONES Trace (A) Negative mg/dL    UROBILINOGEN 0.2  0.2 - 1.0 mg/dL    BILIRUBIN Negative Negative mg/dL    BLOOD Negative Negative mg/dL    NITRITE Negative Negative    LEUKOCYTES Negative Negative WBCs/uL       IMAGING  XR ABD FLAT AND UPRIGHT SERIES (W PA CHEST)   Final Result   No acute pulmonary abnormality. No evidence for bowel obstruction.          Signed by Nelle Don, MD          Procedures  None.       ATTESTATIONS  Vital signs, nursing notes and old records reviewed. If included as part of this work-up, I interpreted the ECG and reviewed all diagnostic imaging as well as provided preliminary interpretation of plain film imaging as noted. As warranted, if prescribing controlled substances, I reviewed the PMP database.    Electronically signed by  Kathreen Cornfield, DO

## 2020-09-14 NOTE — ED Nurses Note (Signed)
Discharged to home per order. Instructions provided, verbalized understanding.

## 2020-09-14 NOTE — ED Triage Notes (Addendum)
Since Wednesday, unable to keep any solid food down, tolerating po fluids

## 2020-09-16 ENCOUNTER — Telehealth (INDEPENDENT_AMBULATORY_CARE_PROVIDER_SITE_OTHER): Payer: Self-pay | Admitting: NURSE PRACTITIONER

## 2020-09-24 ENCOUNTER — Other Ambulatory Visit (HOSPITAL_BASED_OUTPATIENT_CLINIC_OR_DEPARTMENT_OTHER): Payer: Self-pay

## 2020-09-25 ENCOUNTER — Ambulatory Visit
Admission: RE | Admit: 2020-09-25 | Discharge: 2020-09-25 | Disposition: A | Discharge status: Home / Self Care | Payer: No Typology Code available for payment source | Source: Ambulatory Visit

## 2020-09-25 ENCOUNTER — Other Ambulatory Visit: Payer: Self-pay

## 2020-09-25 ENCOUNTER — Ambulatory Visit (HOSPITAL_BASED_OUTPATIENT_CLINIC_OR_DEPARTMENT_OTHER): Payer: No Typology Code available for payment source

## 2020-09-25 ENCOUNTER — Other Ambulatory Visit (HOSPITAL_COMMUNITY): Payer: Self-pay

## 2020-09-25 DIAGNOSIS — K581 Irritable bowel syndrome with constipation: Secondary | ICD-10-CM

## 2020-09-25 DIAGNOSIS — K589 Irritable bowel syndrome without diarrhea: Secondary | ICD-10-CM | POA: Insufficient documentation

## 2020-09-25 DIAGNOSIS — R112 Nausea with vomiting, unspecified: Secondary | ICD-10-CM

## 2020-09-29 LAB — TISSUE TRANSGLUTAMINASE (TTG) IGA & IGG PROFILE
TISSUE TRANSGLUTAMINASE AB, IGG: <1 U/mL
TTG IGA AB: <1 U/mL

## 2020-09-29 LAB — IMMUNOGLOBULIN A (IGA), SERUM: IMMUNOGLOBULIN A: 152 mg/dL (ref 47–310)

## 2020-10-01 ENCOUNTER — Encounter (HOSPITAL_COMMUNITY): Payer: Self-pay

## 2020-10-05 ENCOUNTER — Encounter (HOSPITAL_COMMUNITY): Payer: Self-pay | Admitting: Urology

## 2020-10-07 ENCOUNTER — Other Ambulatory Visit: Payer: Self-pay

## 2020-10-07 ENCOUNTER — Other Ambulatory Visit (INDEPENDENT_AMBULATORY_CARE_PROVIDER_SITE_OTHER): Payer: Self-pay | Admitting: Urology

## 2020-10-07 ENCOUNTER — Ambulatory Visit (HOSPITAL_BASED_OUTPATIENT_CLINIC_OR_DEPARTMENT_OTHER)
Admission: RE | Admit: 2020-10-07 | Discharge: 2020-10-07 | Disposition: A | Discharge status: Home / Self Care | Payer: No Typology Code available for payment source | Source: Ambulatory Visit | Attending: Urology | Admitting: Urology

## 2020-10-07 ENCOUNTER — Ambulatory Visit
Admission: RE | Admit: 2020-10-07 | Discharge: 2020-10-07 | Disposition: A | Discharge status: Home / Self Care | Payer: No Typology Code available for payment source | Source: Ambulatory Visit | Attending: Urology | Admitting: Urology

## 2020-10-07 ENCOUNTER — Ambulatory Visit (HOSPITAL_BASED_OUTPATIENT_CLINIC_OR_DEPARTMENT_OTHER): Payer: No Typology Code available for payment source

## 2020-10-07 DIAGNOSIS — N3020 Other chronic cystitis without hematuria: Secondary | ICD-10-CM | POA: Insufficient documentation

## 2020-10-07 DIAGNOSIS — Z01812 Encounter for preprocedural laboratory examination: Secondary | ICD-10-CM

## 2020-10-07 DIAGNOSIS — I498 Other specified cardiac arrhythmias: Secondary | ICD-10-CM

## 2020-10-07 DIAGNOSIS — Z01818 Encounter for other preprocedural examination: Secondary | ICD-10-CM

## 2020-10-07 LAB — URINALYSIS, MACRO/MICRO
BILIRUBIN: NEGATIVE mg/dL
BLOOD: NEGATIVE mg/dL
GLUCOSE: NEGATIVE mg/dL
KETONES: NEGATIVE mg/dL
LEUKOCYTES: NEGATIVE WBCs/uL
NITRITE: NEGATIVE
PH: 8.5 (ref 5.0–9.0)
SPECIFIC GRAVITY: 1.019 (ref 1.001–1.030)
UROBILINOGEN: 0.2 mg/dL (ref 0.2–1.0)

## 2020-10-07 LAB — URINALYSIS, MICROSCOPIC: BACTERIA: NEGATIVE /hpf

## 2020-10-07 LAB — CBC
HCT: 40.2 % (ref 34.8–46.0)
HGB: 13.4 g/dL (ref 11.5–16.0)
MCH: 28.3 pg (ref 26.0–32.0)
MCHC: 33.3 g/dL (ref 31.0–35.5)
MCV: 84.8 fL (ref 78.0–100.0)
MPV: 12.4 fL (ref 8.7–12.5)
PLATELETS: 230 10*3/uL (ref 150–400)
RBC: 4.74 10*6/uL (ref 3.85–5.22)
RDW-CV: 13.9 % (ref 11.5–15.5)
WBC: 6.8 10*3/uL (ref 3.7–11.0)

## 2020-10-07 LAB — COMPREHENSIVE METABOLIC PNL, FASTING
ALBUMIN/GLOBULIN RATIO: 1.7 (ref >=1.0)
ALBUMIN: 4.6 g/dL (ref 3.5–5.2)
ALKALINE PHOSPHATASE: 45 U/L (ref 41–133)
ALT (SGPT): 8 U/L (ref 0–55)
ANION GAP: 8 mmol/L (ref 6–15)
AST (SGOT): 10 U/L (ref 5–34)
BILIRUBIN TOTAL: 0.5 mg/dL (ref 0.2–1.2)
BUN: 7 mg/dL (ref 7–21)
CALCIUM: 9.5 mg/dL (ref 8.0–10.6)
CHLORIDE: 107 mmol/L (ref 98–107)
CO2 TOTAL: 23 mmol/L (ref 21–32)
CREATININE: 0.92 mg/dL (ref 0.80–1.60)
ESTIMATED GFR: >60 mL/min/{1.73_m2}
GLUCOSE: 80 mg/dL (ref 70–100)
POTASSIUM: 4.3 mmol/L (ref 3.3–5.1)
PROTEIN TOTAL: 7.3 g/dL (ref 6.4–8.3)
SODIUM: 138 mmol/L (ref 136–146)

## 2020-10-07 LAB — ECG 12 LEAD
Atrial Rate: 72 {beats}/min
Calculated P Axis: 35 degrees
Calculated R Axis: 90 degrees
Calculated T Axis: 34 degrees
PR Interval: 128 ms
QRS Duration: 86 ms
QT Interval: 354 ms
QTC Calculation: 387 ms
Ventricular rate: 72 {beats}/min

## 2020-10-07 LAB — PTT (PARTIAL THROMBOPLASTIN TIME): APTT: 29.3 seconds (ref 23.0–32.0)

## 2020-10-07 LAB — PT/INR
INR: 1.01 (ref 0.90–1.10)
PROTHROMBIN TIME: 10.3 seconds (ref 9.0–13.0)

## 2020-10-08 LAB — URINE CULTURE,ROUTINE: URINE CULTURE: 80000 — AB

## 2020-10-09 ENCOUNTER — Other Ambulatory Visit (HOSPITAL_COMMUNITY): Payer: Self-pay

## 2020-10-09 DIAGNOSIS — R112 Nausea with vomiting, unspecified: Secondary | ICD-10-CM

## 2020-10-09 DIAGNOSIS — R4020 Unspecified coma: Secondary | ICD-10-CM

## 2020-10-12 ENCOUNTER — Other Ambulatory Visit (INDEPENDENT_AMBULATORY_CARE_PROVIDER_SITE_OTHER): Payer: Self-pay | Admitting: Physician Assistant

## 2020-10-12 ENCOUNTER — Telehealth (INDEPENDENT_AMBULATORY_CARE_PROVIDER_SITE_OTHER): Payer: Self-pay | Admitting: Urology

## 2020-10-12 MED ORDER — NITROFURANTOIN MONOHYDRATE/MACROCRYSTALS 100 MG CAPSULE
100.0000 mg | ORAL_CAPSULE | Freq: Two times a day (BID) | ORAL | 0 refills | Status: DC
Start: 2020-10-12 — End: 2020-10-22

## 2020-10-12 NOTE — Telephone Encounter (Signed)
Pt is "returning Dr. Patrica Duel call from Saturday because her only symptom is burning w/ urination and she would like an RX"

## 2020-10-12 NOTE — Telephone Encounter (Signed)
Patient returning Dr. Patrica Duel call from Saturday. She is symptomatic of a UTI and would like someone to call her back.

## 2020-10-19 ENCOUNTER — Ambulatory Visit
Admission: RE | Admit: 2020-10-19 | Discharge: 2020-10-19 | Disposition: A | Discharge status: Home / Self Care | Payer: No Typology Code available for payment source | Source: Ambulatory Visit

## 2020-10-19 ENCOUNTER — Other Ambulatory Visit: Payer: Self-pay

## 2020-10-19 DIAGNOSIS — R112 Nausea with vomiting, unspecified: Secondary | ICD-10-CM

## 2020-10-20 ENCOUNTER — Encounter (HOSPITAL_COMMUNITY): Payer: Self-pay | Admitting: Urology

## 2020-10-20 ENCOUNTER — Other Ambulatory Visit: Payer: Self-pay

## 2020-10-20 ENCOUNTER — Ambulatory Visit (HOSPITAL_BASED_OUTPATIENT_CLINIC_OR_DEPARTMENT_OTHER): Payer: No Typology Code available for payment source

## 2020-10-20 ENCOUNTER — Inpatient Hospital Stay
Admission: RE | Admit: 2020-10-20 | Discharge: 2020-10-20 | Disposition: A | Discharge status: Home / Self Care | Payer: No Typology Code available for payment source | Source: Ambulatory Visit | Attending: Urology | Admitting: Urology

## 2020-10-20 ENCOUNTER — Encounter (HOSPITAL_COMMUNITY)
Admission: RE | Disposition: A | Discharge status: Home / Self Care | Payer: Self-pay | Source: Ambulatory Visit | Attending: Urology

## 2020-10-20 ENCOUNTER — Ambulatory Visit (HOSPITAL_COMMUNITY): Payer: No Typology Code available for payment source

## 2020-10-20 DIAGNOSIS — K3184 Gastroparesis: Secondary | ICD-10-CM | POA: Insufficient documentation

## 2020-10-20 DIAGNOSIS — N390 Urinary tract infection, site not specified: Secondary | ICD-10-CM

## 2020-10-20 DIAGNOSIS — Z87440 Personal history of urinary (tract) infections: Secondary | ICD-10-CM

## 2020-10-20 DIAGNOSIS — K5909 Other constipation: Secondary | ICD-10-CM

## 2020-10-20 HISTORY — DX: Migraine, unspecified, not intractable, without status migrainosus: G43.909

## 2020-10-20 HISTORY — DX: Hypothyroidism, unspecified: E03.9

## 2020-10-20 HISTORY — DX: Urinary tract infection, site not specified: N39.0

## 2020-10-20 HISTORY — DX: Nausea with vomiting, unspecified: R11.2

## 2020-10-20 HISTORY — DX: Nausea with vomiting, unspecified: Z98.890

## 2020-10-20 HISTORY — DX: Personal history of other venous thrombosis and embolism: Z86.718

## 2020-10-20 HISTORY — DX: Constipation, unspecified: K59.00

## 2020-10-20 HISTORY — DX: Personal history of other specified conditions: Z87.898

## 2020-10-20 HISTORY — DX: Panic disorder (episodic paroxysmal anxiety): F41.0

## 2020-10-20 HISTORY — DX: Nicotine dependence, unspecified, uncomplicated: F17.200

## 2020-10-20 HISTORY — DX: Depression, unspecified: F32.A

## 2020-10-20 HISTORY — DX: Irritable bowel syndrome, unspecified: K58.9

## 2020-10-20 HISTORY — DX: Anxiety disorder, unspecified: F41.9

## 2020-10-20 LAB — HCG, URINE QUALITATIVE, PREGNANCY
HCG URINE QUALITATIVE: NEGATIVE
SPECIFIC GRAVITY: 1.018 (ref 1.000–1.030)

## 2020-10-20 SURGERY — CYSTOSCOPY
Anesthesia: Monitor Anesthesia Care | Site: Bladder | Wound class: Clean Contaminated Wounds-The respiratory, GI, Genital, or urinary

## 2020-10-20 MED ORDER — ONDANSETRON HCL (PF) 4 MG/2 ML INJECTION SOLUTION
Freq: Once | INTRAMUSCULAR | Status: DC | PRN
Start: 2020-10-20 — End: 2020-10-20
  Administered 2020-10-20: 4 mg via INTRAVENOUS

## 2020-10-20 MED ORDER — SODIUM CHLORIDE 0.9 % INTRAVENOUS SOLUTION
2.0000 g | Freq: Once | INTRAVENOUS | Status: AC
Start: 2020-10-20 — End: 2020-10-20
  Administered 2020-10-20: 2 g via INTRAVENOUS
  Filled 2020-10-20: qty 20

## 2020-10-20 MED ORDER — KETAMINE 50 MG/ML INJECTION SOLUTION
Freq: Once | INTRAMUSCULAR | Status: DC | PRN
Start: 2020-10-20 — End: 2020-10-20
  Administered 2020-10-20: 25 mg via INTRAVENOUS

## 2020-10-20 MED ORDER — SODIUM CHLORIDE 0.9 % (FLUSH) INJECTION SYRINGE
3.0000 mL | INJECTION | Freq: Three times a day (TID) | INTRAMUSCULAR | Status: DC
Start: 2020-10-20 — End: 2020-10-20

## 2020-10-20 MED ORDER — SODIUM CHLORIDE 0.9 % (FLUSH) INJECTION SYRINGE
3.0000 mL | INJECTION | INTRAMUSCULAR | Status: DC | PRN
Start: 2020-10-20 — End: 2020-10-20

## 2020-10-20 MED ORDER — LACTATED RINGERS INTRAVENOUS SOLUTION
INTRAVENOUS | Status: DC
Start: 2020-10-20 — End: 2020-10-20
  Administered 2020-10-20: 0 via INTRAVENOUS
  Administered 2020-10-20: 0 mL via INTRAVENOUS

## 2020-10-20 MED ORDER — DEXMEDETOMIDINE 4 MCG/ML IV DILUTION
Freq: Once | INTRAMUSCULAR | Status: DC | PRN
Start: 2020-10-20 — End: 2020-10-20
  Administered 2020-10-20: 12 ug via INTRAVENOUS
  Administered 2020-10-20: 8 ug via INTRAVENOUS

## 2020-10-20 MED ORDER — PROPOFOL 10 MG/ML IV BOLUS
INJECTION | Freq: Once | INTRAVENOUS | Status: DC | PRN
Start: 2020-10-20 — End: 2020-10-20
  Administered 2020-10-20: 50 mg via INTRAVENOUS

## 2020-10-20 MED ORDER — LACTATED RINGERS INTRAVENOUS SOLUTION
INTRAVENOUS | Status: DC
Start: 2020-10-20 — End: 2020-10-20

## 2020-10-20 MED ORDER — NITROFURANTOIN MACROCRYSTAL 50 MG CAPSULE
50.0000 mg | ORAL_CAPSULE | Freq: Every evening | ORAL | 2 refills | Status: DC
Start: 2020-10-20 — End: 2020-11-26

## 2020-10-20 MED ORDER — FENTANYL (PF) 50 MCG/ML INJECTION SOLUTION
Freq: Once | INTRAMUSCULAR | Status: DC | PRN
Start: 2020-10-20 — End: 2020-10-20
  Administered 2020-10-20: 100 ug via INTRAVENOUS

## 2020-10-20 MED ORDER — MIDAZOLAM (PF) 1 MG/ML INJECTION SOLUTION
Freq: Once | INTRAMUSCULAR | Status: DC | PRN
Start: 2020-10-20 — End: 2020-10-20
  Administered 2020-10-20: 4 mg via INTRAVENOUS

## 2020-10-20 SURGICAL SUPPLY — 8 items
BAG DRAIN SIEMENS LG1000 WAS SIEMENS CMS900V (SUPP) ×2 IMPLANT
BAG URINARY DRAINAGE BAG URINARY DRAINAGE (SUPP) IMPLANT
GOWN AERO LG 32/CS was GOWN AERO 44673 LG 32/CS (GOWN) ×1 IMPLANT
GOWN DYNJP2401 ULTRA LG 32/CS GOWN DYNJP2401 ULTRA LG 32/CS (GOWN) ×2 IMPLANT
PACK DYNJ50021 CYSTO PACK CYSTO (CUSTOM TRAYS & PACK) ×1 IMPLANT
SOL WATER 1000ML BAG 797305 SOL WATER 1000ML BAG 797305 (IRRG) ×1 IMPLANT
SOL WATER 1500ML BOTTLE SOL WATER 1500ML PB (IRRG) ×1 IMPLANT
SYRINGE 10CC LUER LOK TIP ISP (SUPP) ×1 IMPLANT

## 2020-10-20 NOTE — H&P (Signed)
[facility]  Urology - History and Physical    [name], 20 y.o. female  Date of Birth:  January 06, 2001  MRN:  [id number]  Date of service: 10/20/2020    Chief Complaint:  Recurrent UTI    History of present illness:  She has been having recurrent urinary tract infections since the age of 40. She became sexually active around age 50. She has had no correlation with urinary tract infections with sexual activity.  She denies any pneumaturia, fecaluria.  She reports back pain, pelvic pressure when having urinary tract infection.  Denies any history of stones.  She has a history of endometriosis, and has been on hormonal therapy, and has undergone laparoscopic ablation, the most recent of which was complicated by what sounds like a hematoma intra-abdominally.  She does not have any cyclic nature to her urinary tract infections.  Most recent urine culture had shown 80,000 Staph epidermidis, pansensitive.  Prior to that testing in May was negative for abnormalities.  March 7th urine culture showed Streptococcus anginosus, in February she grew 60,000 Klebsiella.  Prior CT scan from January 2022 had shown no evidence of kidney abnormality, no hydronephrosis, no perivesical stranding. She is undergoing workup gastroparesis and chronic constipation.    ROS:  MUST comment on all "Abnormal" findings   ROS Other than ROS in the HPI, all other systems were negative.    PAST MEDICAL/ FAMILY/ SOCIAL HISTORY:       Past Medical History:   Diagnosis Date   . Anxiety    . Anxiety state    . Constipation    . Depression    . Endometriosis    . Foot fracture     Right   . Gastroesophageal reflux disease 02/19/2014   . HA (headache)    . Hashimoto's thyroiditis    . Hemorrhage of right ovary 02/19/2015   . History of anesthesia complications     takes a while to wake up   . Hypothyroid    . Hypothyroidism    . Irritable bowel syndrome    . Migraine    . Obesity    . Panic attack    . Personal history of venous thrombosis and embolism      in stomach after surgery   . PONV (postoperative nausea and vomiting)    . Smoker     vapes with nicotine   . UTI (urinary tract infection)    . Vitamin D deficiency          Past Surgical History:   Procedure Laterality Date   . FOOT SURGERY Right    . HX APPENDECTOMY     . HX CHOLECYSTECTOMY     . HX DENTAL EXTRACTION     . HX FOOT SURGERY     . HX OTHER  10/30/2015    DIAG LAPARO SEPARATE PROC - fulgration of endometriotic implants   . HX OTHER      ORIF Right foot   . HX TONSIL AND ADENOIDECTOMY     . HX TONSILLECTOMY     . LAPAROSCOPIC ENDOMETRIOSIS FULGURATION     . LAPAROSCOPIC ENDOMETRIOSIS FULGURATION      2nd surgery was to also drain a blood clot.           Allergies   Allergen Reactions   . Sulfamethoxazole-Trimethoprim Hives/ Urticaria     Medications Prior to Admission     Prescriptions    Cholecalciferol, Vitamin D3, (VITAMIN D-3) 50 mcg (2,000 unit)  Oral Capsule    Take 1 Capsule by mouth Once a day    cyanocobalamin (VITAMIN B12) 1,000 mcg/mL Injection Solution    Inject 1 mL (1,000 mcg total) under the skin Every 30 days    famotidine (PEPCID) 20 mg Oral Tablet    Take 1 Tablet (20 mg total) by mouth Twice daily    linaCLOtide (LINZESS) 290 mcg Oral Capsule    Take 290 mcg by mouth Every morning    Patient not taking:  Reported on 10/20/2020    medroxyPROGESTERone (DEPO-PROVERA) 150 mg/mL IntraMUSCULAR Suspension    INJECT 1 ML (150 MG TOTAL) INTRAMUSCULARLY EVERY 3 MONTHS    nitrofurantoin (MACROBID) 100 mg Oral Capsule    Take 1 Capsule (100 mg total) by mouth Twice daily for 10 days    ondansetron (ZOFRAN ODT) 4 mg Oral Tablet, Rapid Dissolve    Take 1 Tablet (4 mg total) by mouth Every 8 hours as needed for Nausea/Vomiting    pantoprazole (PROTONIX) 40 mg Oral Tablet, Delayed Release (E.C.)    Take 40 mg by mouth Once a day    Syringe with Needle, Disp, (MONOJECT 3CC SYR 25GX1") 3 mL 25 gauge x 1" Syringe    Use once a month    topiramate (TOPAMAX) 50 mg Oral Tablet    Take 50 mg by mouth  Every night         LR premix infusion, , Intravenous, Continuous  LR premix infusion, , Intravenous, Continuous  NS flush syringe, 3 mL, Intracatheter, Q8HRS  NS flush syringe, 3 mL, Intracatheter, Q1H PRN        Family History:   Family Medical History:     Problem Relation (Age of Onset)    Alopecia Sister    COLITIS Brother    Hypertension (High Blood Pressure) Mother    Migraines Mother    No Known Problems Father    Thyroid Disease Mother           Social History:   Social History     Tobacco Use   . Smoking status: Never Smoker   . Smokeless tobacco: Current User   Vaping Use   . Vaping Use: Every day   . Substances: Nicotine   . Devices: Pre-filled pod   Substance Use Topics   . Alcohol use: Yes     Comment: occasionally   . Drug use: Yes     Types: Marijuana         PHYSICAL EXAMINATION: MUST comment on all "Abnormal" findings    Exam Temperature: 36.3 C (97.3 F)  Heart Rate: 78  BP (Non-Invasive): 125/80  Respiratory Rate: 18  SpO2: 92 %  General: NAD, AAO 4  HEENT: NCAT, EOMI  Neck: supple, trachea midline  Cardiovascular: RRR, bilateral upper and lower extremities warm and well-perfused  Lungs: Symmetric rise, no audible wheeze, conversational with ease  Abdomen: soft, nontender, nondistended, no CVA tenderness, paraspinal tenderness present bilaterally.  Genitourinary:  Deferred for OR  Musculoskeletal: Range of motion in bilateral upper and lower extremities grossly within normal limits  Extremities: No clubbing, cyanosis, or edema  Skin: No obvious skin breakdown or rash  Neurological: Cranial nerves II through XII grossly within normal limits, sensation intact to light touch distally  Psychiatric: Appropriate mood and affect      Labs Reviewed    Reviewed: Labs:  Labs 10/07/2020 showed normal coagulation panel.  Normal white blood cell count, hemoglobin hematocrit, platelet count.  Sodium 138. Potassium 4.3.  Chloride 107.  CO2 23.  BUN 7.  Creatinine 0.9.    Lab Results   Component Value  Date/Time    COLOR Yellow 10/07/2020 12:39 PM    SPECGRAVUR 1.018 10/20/2020 07:43 AM    PHURINE 8.5 10/07/2020 12:39 PM    PROTEIN Trace (A) 10/07/2020 12:39 PM    GLUCOSE Negative 10/07/2020 12:39 PM    KETONES Negative 10/07/2020 12:39 PM    UROBILINOGEN 0.2  10/07/2020 12:39 PM    LEUKOCYTES Negative 10/07/2020 12:39 PM    NITRITE Negative 10/07/2020 12:39 PM    WBC 6.8 10/07/2020 12:39 PM    RBC 4.74 10/07/2020 12:39 PM    BACTERIA Negative 10/07/2020 12:39 PM    MUCOUS 3+ (A) 04/26/2020 05:56 AM    BILIRUBIN Negative 10/07/2020 12:39 PM    BLOOD 5-10 12/24/2019 12:00 AM    HGBURINE Negative 10/07/2020 12:39 PM    SQUAEPIT Few (A) 10/07/2020 12:39 PM         Radiology Tests Reviewed    Reviewed:Prior CT imaging from January 2022        IMPRESSION:    Recurrent urinary tract infections, history of endometriosis    Recommendations    Will plan cystoscopy.  We discussed diagnostic nature of this.  We discussed possible impact of endometriosis on her urinary symptoms.  We discussed the risk of bleeding, infection, damage to local structures.   She has not been on a course of suppressive antibiotics, and already continues with good fluid intake (greater than 120 oz per day), and has started with azo and D mannose without any significant improvement in her urinary tract infection frequency.  Will consider up prophylaxis down the road if cystoscopy today does not reveal any other intraluminal abnormalities.    Nolon Nations, MD

## 2020-10-20 NOTE — Anesthesia Postprocedure Evaluation (Signed)
Anesthesia Post Op Evaluation    Patient: Traci [name]  Procedure(s):  CYSTOSCOPY    Last Vitals:Temperature: 36 C (96.8 F) (10/20/20 0945)  Heart Rate: 81 (10/20/20 0945)  BP (Non-Invasive): (!) 103/50 (10/20/20 0945)  Respiratory Rate: 20 (10/20/20 0945)  SpO2: 97 % (10/20/20 0945)    No complications documented.    Patient is sufficiently recovered from the effects of anesthesia to participate in the evaluation and has returned to their pre-procedure level.  Patient location during evaluation: PACU       Patient participation: complete - patient participated  Level of consciousness: awake and alert and responsive to verbal stimuli    Pain management: adequate  Airway patency: patent    Anesthetic complications: no  Cardiovascular status: acceptable  Respiratory status: acceptable  Hydration status: acceptable  Patient post-procedure temperature: Pt Normothermic   PONV Status: Absent

## 2020-10-20 NOTE — Anesthesia Preprocedure Evaluation (Signed)
ANESTHESIA PRE-OP EVALUATION  Planned Procedure: CYSTOSCOPY (N/A )  Review of Systems                   Pulmonary     Cardiovascular     Exercise Tolerance: > or = 4 METS        GI/Hepatic/Renal           Endo/Other         Neuro/Psych/MS        Cancer                      Physical Assessment      Airway       Mallampati: II    TM distance: >3 FB    Neck ROM: full  Mouth Opening: good.            Dental                    Pulmonary    Breath sounds clear to auscultation       Cardiovascular    Rhythm: regular  Rate: Normal       Other findings            Plan  ASA 2     Planned anesthesia type: MAC                     Intravenous induction     Anesthesia issues/risks discussed are: Intraoperative Awareness/ Recall, Stroke, Post-op Intubation/Ventilation, Cardiac Events/MI, Sore Throat, Post-op Agitation/Tantrum, PONV, Post-op Cognitive Dysfunction, Difficult Airway, Blood Loss, Nerve Injuries, Dental Injuries and Aspiration.  Anesthetic plan and risks discussed with patient and mother             Patient's NPO status is appropriate for Anesthesia.       Patient seen prior to and/or day of surgery and instructed not to smoke    Plan discussed with CRNA.            Urine Pregnancy Results: Negative (sp. gravity 1.018)

## 2020-10-20 NOTE — OR Surgeon (Signed)
PATIENT NAMEMARIO, Traci [name]  HOSPITAL NUMBER:  [id number]  DATE OF SERVICE: 10/20/2020  DATE OF BIRTH:  Feb 26, 2001    OPERATIVE REPORT    PREOPERATIVE DIAGNOSIS:  Recurrent urinary tract infection.    POSTOPERATIVE DIAGNOSIS:  Recurrent urinary tract infection.    NAME OF PROCEDURE:  Cystoscopy.    SURGEON:  Olevia Westervelt B. Micheal Likens, MD.    ASSISTANT:  None.    ANESTHESIA:  Sedation.    COMPLICATIONS:  None intraoperatively.    DISPOSITION:  Stable to recovery room.    INDICATIONS FOR PROCEDURE:  [name] is a 20 year old woman with a history of recurrent urinary tract infections going back approximately 5 years.  She has a history of endometriosis and has undergone laparoscopic fulguration of this as well as continued hormonal therapy.  She is also undergoing workup for gastroparesis given her issues with chronic constipation.  She has had no evidence of any other prior vaginal surgery or history of pelvic radiation.  We discussed cystoscopy to evaluate intraluminal causes of her intermittent pelvic discomfort and recurrent urinary tract infections.  After a thorough understanding of the risks and benefits, including risks of bleeding, infection, and damage to local structures, she elected to proceed with surgery.    DESCRIPTION OF PROCEDURE:  The patient was brought to the operating room theater and placed under general anesthesia without complication.  She was then placed in a lithotomy position with the appropriate padding of all pressure points.  Her genitalia was then prepped and draped in sterile fashion.  A surgical pause was then performed, confirming the patient's name, identifying information, procedure to be performed, and on-call to OR antibiotics.  At that point, a 21-French rigid cystoscope was advanced per urethra into the bladder.  This revealed no evidence of any urethral prolapse or urethral caruncle.  There was no evidence of any urethral diverticulum.  Anterior vaginal wall examination did not reveal any  evidence of palpable abnormality.  The bladder neck was within normal limits.  Upon entering the bladder, she had bilateral orthotopic ureteral orifices.  Urine in the bladder was clear.  There was no evidence of any squamous metaplasia.  She did have hyperemia throughout the bladder.  There was no evidence of any endometrial implants or evidence of ulceration consistent with interstitial cystitis.  I inspected the entirety of the bladder.  I performed multiple rounds of irrigation to minimize any bacterial load.  The bladder was then emptied and the scope removed.  This completed the procedure.  The patient tolerated the procedure well.     The plan will be for an antibiotic prophylaxis with Macrobid after she completes her treatment course.  We will discontinue use of Azo since this may be contributing to her urinary symptoms.  If she does not have improvement in her urinary symptoms, we may begin treating her empirically for interstitial cystitis, as this may be the cause of recurrent dysuria.  We will also follow a urinalysis and urine culture as needed and treat her as needed for any interval documented urinary tract infections.        Lillia Corporal, MD                DD:  10/20/2020 13:39:02  DT:  10/20/2020 14:33:54 TM  D#:  [id number]

## 2020-10-20 NOTE — Discharge Instructions (Signed)
SURGICAL DISCHARGE INSTRUCTIONS     Dr. Nolon Nations, MD  performed your CYSTOSCOPY today at the [facility]    [facility] ([facility])  Monday through Friday 8:00 am to 4:00 pm  [phone]        PLEASE SEE WRITTEN HANDOUTS AS DISCUSSED BY YOUR NURSE:  Marisue Ivan    SIGNS AND SYMPTOMS OF A WOUND / INCISION INFECTION   Be sure to watch for the following:   Increase in redness or red streaks near or around the wound or incision.   Increase in pain that is intense or severe and cannot be relieved by the pain medication that your doctor has given you.   Increase in swelling that cannot be relieved by elevation of a body part, or by applying ice, if permitted.   Increase in drainage, or if yellow / green in color and smells bad. This could be on a dressing or a cast.   Increase in fever for longer than 24 hours, or an increase that is higher than 101 degrees Fahrenheit (normal body temperature is 98 degrees Fahrenheit). The incision may feel warm to the touch.    **CALL YOUR DOCTOR IF ONE OR MORE OF THESE SIGNS / SYMPTOMS SHOULD OCCUR.    ANESTHESIA INFORMATION   ANESTHESIA -- ADULT PATIENTS:  You have received intravenous sedation / general anesthesia, and you may feel drowsy and light-headed for several hours. You may even experience some forgetfulness of the procedure. DO NOT DRIVE A MOTOR VEHICLE or perform any activity requiring complete alertness or coordination until you feel fully awake in about 24-48 hours. Do not drink alcoholic beverages for at least 24 hours. Do not stay alone, you must have a responsible adult available to be with you. You may also experience a dry mouth or nausea for 24 hours. This is a normal side effect and will disappear as the effects of the medication wear off.    REMEMBER   If you experience any difficulty breathing, chest pain, bleeding that you feel is excessive, persistent nausea or vomiting or for any other concerns:  Call your physician Dr.   Nolon Nations, MD     at [phone] or [phone] You may also ask to have the general doctor on call paged. They are available to you 24 hours a day.    SPECIAL INSTRUCTIONS / COMMENTS   Urine may be blood tinged for 1-2 days, this is normal. Any increase in bleeding or clots, call the office or return to the ER  You may experience some burning with urination, this is also normal  Drink plenty of fluids, this will help with the blood in urine and help with the burning    FOLLOW-UP APPOINTMENTS   Please call your surgeon's office at the number listed about  to schedule a date / time of return.

## 2020-10-20 NOTE — Anesthesia Transfer of Care (Signed)
ANESTHESIA TRANSFER OF CARE   Traci [name] is a 20 y.o. ,female, Weight: 69.4 kg (153 lb)   had Procedure(s):  CYSTOSCOPY  performed  10/20/20   Primary Service: Nolon Nations, MD    Past Medical History:   Diagnosis Date   . Anxiety    . Anxiety state    . Constipation    . Depression    . Endometriosis    . Foot fracture     Right   . Gastroesophageal reflux disease 02/19/2014   . HA (headache)    . Hashimoto's thyroiditis    . Hemorrhage of right ovary 02/19/2015   . History of anesthesia complications     takes a while to wake up   . Hypothyroid    . Hypothyroidism    . Irritable bowel syndrome    . Migraine    . Obesity    . Panic attack    . 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 PONV (postoperative nausea and vomiting)    . Smoker     vapes with nicotine   . UTI (urinary tract infection)    . Vitamin D deficiency       Allergy History as of 10/20/20     SULFAMETHOXAZOLE-TRIMETHOPRIM       Noted Status Severity Type Reaction    03/16/18 1458 Kathlynn Grate, MA 09/27/11 Active Low  Hives/ Urticaria              I completed my transfer of care / handoff to the receiving personnel during which we discussed:  Access, Airway, All key/critical aspects of case discussed, Analgesia, Antibiotics, Expectation of post procedure, Fluids/Product, Gave opportunity for questions and acknowledgement of understanding, Labs and PMHx    Post Location: PACU                                       Report given to: Rowe Clack, RN                           Last OR Temp: Temperature: 36 C (96.8 F)  ABG:  POTASSIUM   Date Value Ref Range Status   10/07/2020 4.3 3.3 - 5.1 mmol/L Final   06/06/2019 4.1  Final     KETONES   Date Value Ref Range Status   10/07/2020 Negative Negative mg/dL Final     CALCIUM   Date Value Ref Range Status   10/07/2020 9.5 8.0 - 10.6 mg/dL Final   20/94/7096 9.1  Final     CALCIUM OXALATE CRYSTALS   Date Value Ref Range Status   09/10/2020 Many (A) None  /hpf Final     Calculated P Axis   Date Value Ref Range Status   10/07/2020 35 degrees Final     Calculated R Axis   Date Value Ref Range Status   10/07/2020 90 degrees Final     Calculated T Axis   Date Value Ref Range Status   10/07/2020 34 degrees Final     Airway:* No LDAs found *  Blood pressure (!) 103/50, pulse 81, temperature 36 C (96.8 F), resp. rate 20, height 1.575 m (5\' 2" ), weight 69.4 kg (153 lb), last menstrual period 05/13/2020, SpO2 97 %.

## 2020-10-21 ENCOUNTER — Encounter (INDEPENDENT_AMBULATORY_CARE_PROVIDER_SITE_OTHER): Payer: Self-pay | Admitting: NURSE PRACTITIONER

## 2020-10-22 ENCOUNTER — Telehealth (INDEPENDENT_AMBULATORY_CARE_PROVIDER_SITE_OTHER): Payer: Self-pay | Admitting: Urology

## 2020-10-22 ENCOUNTER — Other Ambulatory Visit: Payer: Self-pay

## 2020-10-22 ENCOUNTER — Ambulatory Visit (INDEPENDENT_AMBULATORY_CARE_PROVIDER_SITE_OTHER): Payer: No Typology Code available for payment source | Admitting: NURSE PRACTITIONER

## 2020-10-22 ENCOUNTER — Encounter (INDEPENDENT_AMBULATORY_CARE_PROVIDER_SITE_OTHER): Payer: Self-pay | Admitting: NURSE PRACTITIONER

## 2020-10-22 VITALS — BP 122/84 | HR 64 | Temp 97.4°F | Ht 62.0 in | Wt 158.6 lb

## 2020-10-22 DIAGNOSIS — E538 Deficiency of other specified B group vitamins: Secondary | ICD-10-CM

## 2020-10-22 DIAGNOSIS — R252 Cramp and spasm: Secondary | ICD-10-CM

## 2020-10-22 DIAGNOSIS — K219 Gastro-esophageal reflux disease without esophagitis: Secondary | ICD-10-CM

## 2020-10-22 DIAGNOSIS — R519 Headache, unspecified: Secondary | ICD-10-CM

## 2020-10-22 DIAGNOSIS — E559 Vitamin D deficiency, unspecified: Secondary | ICD-10-CM

## 2020-10-22 DIAGNOSIS — R7989 Other specified abnormal findings of blood chemistry: Secondary | ICD-10-CM

## 2020-10-22 DIAGNOSIS — Z6829 Body mass index (BMI) 29.0-29.9, adult: Secondary | ICD-10-CM

## 2020-10-22 NOTE — Nursing Note (Signed)
Patient is here today for a follow up. Patient does not have ant concerns to discuss today. Patient does not recall last Tdap thinks it was recently. Patients last ap was in 2019 at [facility].

## 2020-10-22 NOTE — Telephone Encounter (Signed)
Attempted to contact pt to discuss request/NA & mailbox is full, unable to LM

## 2020-10-22 NOTE — Telephone Encounter (Signed)
PT NEEDS A RETURN TO WORK EARLIER THAN HAD BEEN ORIGINALLY ANTICIPATED.  PT SAID She WILL COME PICK IT UP.  HER PHONE [phone]

## 2020-10-22 NOTE — Progress Notes (Signed)
General Exam    Subjective:    Traci [name] is an 20 y.o. female who is here for a routine follow up. Medical problems include low B12 level, vitamin D deficiency, GERD, and headaches.  The patient follows with Neurology for chronic headaches and has been stable.  She is currently taking Topamax and was recently started on B12 injections for low B12 level.  She tells me she has been feeling better.  Headaches have remained at no increase in frequency or intensity.  She has vitamin-D deficiency is currently on supplement.  She has GERD and does follow regularly with Gastroenterology.  She did just have a gastric emptying scan completed yesterday.  Those results are pending.  Today, she tells me she has been noticing she has been having bilateral leg cramps over the past couple weeks.  She denies any injury.  She tells me it just mostly at night when she lays down to go to bed she feels cramping.  There is no chest pain or shortness of breath.  Most recent blood work is as follows:    COMPREHENSIVE METABOLIC PANEL - NON FASTING  Lab Results   Component Value Date    SODIUM 138 10/07/2020    POTASSIUM 4.3 10/07/2020    CHLORIDE 107 10/07/2020    CO2 23 10/07/2020    ANIONGAP 8 10/07/2020    BUN 7 10/07/2020    CREATININE 0.92 10/07/2020    GLUCOSENF 80 10/07/2020    CALCIUM 9.5 10/07/2020    ALBUMIN 4.6 10/07/2020    TOTALPROTEIN 7.3 10/07/2020    ALKPHOS 45 10/07/2020    AST 10 10/07/2020    ALT 8 10/07/2020     CBC  Diff   Lab Results   Component Value Date/Time    WBC 6.8 10/07/2020 12:39 PM    HGB 13.4 10/07/2020 12:39 PM    HCT 40.2 10/07/2020 12:39 PM    PLTCNT 230 10/07/2020 12:39 PM    RBC 4.74 10/07/2020 12:39 PM    MCV 84.8 10/07/2020 12:39 PM    MCHC 33.3 10/07/2020 12:39 PM    MCH 28.3 10/07/2020 12:39 PM    RDW 15.3 06/06/2019 12:00 AM    MPV 12.4 10/07/2020 12:39 PM    Lab Results   Component Value Date/Time    PMNS 64 07/19/2020 11:39 AM    MONOCYTES 7 07/19/2020 11:39 AM    BASOPHILS 0 07/19/2020 11:39  AM    BASOPHILS <0.10 07/19/2020 11:39 AM    PMNABS 4.73 07/19/2020 11:39 AM    LYMPHSABS 1.88 07/19/2020 11:39 AM    EOSABS <0.10 07/19/2020 11:39 AM    MONOSABS 0.51 07/19/2020 11:39 AM    BASOSABS 0.0 07/03/2018 12:00 AM            Allergies   Allergen Reactions   . Sulfamethoxazole-Trimethoprim Hives/ Urticaria       Current Outpatient Medications:   .  Cholecalciferol, Vitamin D3, (VITAMIN D-3) 50 mcg (2,000 unit) Oral Capsule, Take 1 Capsule by mouth Once a day, Disp: 100 Capsule, Rfl: 3  .  cyanocobalamin (VITAMIN B12) 1,000 mcg/mL Injection Solution, Inject 1 mL (1,000 mcg total) under the skin Every 30 days, Disp: 3 mL, Rfl: 3  .  medroxyPROGESTERone (DEPO-PROVERA) 150 mg/mL IntraMUSCULAR Suspension, INJECT 1 ML (150 MG TOTAL) INTRAMUSCULARLY EVERY 3 MONTHS, Disp: , Rfl:   .  nitrofurantoin macrocrystaL (MACRODANTIN) 50 mg Oral Capsule, Take 1 Capsule (50 mg total) by mouth Every night for 90 days After completion  of twice a day dosing, Disp: 30 Capsule, Rfl: 2  .  pantoprazole (PROTONIX) 40 mg Oral Tablet, Delayed Release (E.C.), Take 40 mg by mouth Once a day, Disp: , Rfl:   .  Syringe with Needle, Disp, (MONOJECT 3CC SYR 25GX1") 3 mL 25 gauge x 1" Syringe, Use once a month, Disp: 12 Each, Rfl: 1  .  topiramate (TOPAMAX) 50 mg Oral Tablet, Take 50 mg by mouth Every night, Disp: , Rfl:   Past Medical History:   Diagnosis Date   . Anxiety    . Anxiety state    . Constipation    . Depression    . Endometriosis    . Foot fracture     Right   . Gastroesophageal reflux disease 02/19/2014   . HA (headache)    . Hashimoto's thyroiditis    . Hemorrhage of right ovary 02/19/2015   . History of anesthesia complications     takes a while to wake up   . Hypothyroid    . Hypothyroidism    . Irritable bowel syndrome    . Migraine    . Obesity    . Panic attack    . 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 PONV (postoperative nausea and vomiting)    . Smoker     vapes with  nicotine   . UTI (urinary tract infection)    . Vitamin D deficiency          Past Surgical History:   Procedure Laterality Date   . FOOT SURGERY Right    . HX APPENDECTOMY     . HX CHOLECYSTECTOMY     . HX DENTAL EXTRACTION     . HX FOOT SURGERY     . HX OTHER  10/30/2015    DIAG LAPARO SEPARATE PROC - fulgration of endometriotic implants   . HX OTHER      ORIF Right foot   . HX TONSIL AND ADENOIDECTOMY     . HX TONSILLECTOMY     . LAPAROSCOPIC ENDOMETRIOSIS FULGURATION     . LAPAROSCOPIC ENDOMETRIOSIS FULGURATION      2nd surgery was to also drain a blood clot.         Social History     Socioeconomic History   . Marital status: Single     Spouse name: Not on file   . Number of children: Not on file   . Years of education: Not on file   . Highest education level: Not on file   Occupational History   . Not on file   Tobacco Use   . Smoking status: Never Smoker   . Smokeless tobacco: Current User   Vaping Use   . Vaping Use: Every day   . Substances: Nicotine   . Devices: Pre-filled pod   Substance and Sexual Activity   . Alcohol use: Yes     Comment: occasionally   . Drug use: Yes     Types: Marijuana     Comment: has medical marijuana card; smokes marijuana  2 times a day   . Sexual activity: Yes     Birth control/protection: Condom   Other Topics Concern   . Ability to Walk 1 Flight of Steps without SOB/CP Not Asked   . Routine Exercise Not Asked   . Ability to Walk 2 Flight of Steps without SOB/CP Not Asked   . Unable to Ambulate Not Asked   .  Total Care Not Asked   . Ability To Do Own ADL's Not Asked   . Uses Walker Not Asked   . Other Activity Level Not Asked   . Uses Cane Not Asked   Social History Narrative   . Not on file     Social Determinants of Health     Financial Resource Strain: Not on file   Food Insecurity: Not on file   Transportation Needs: Not on file   Physical Activity: Not on file   Stress: Not on file   Intimate Partner Violence: Not on file   Housing Stability: Not on file          ROS:  General:  Denies significant weight change, fevers, chills, fatigue.  Eyes:  Denies visual changes.  HENT:  Denies nasal congestion, hearing loss, ear pain, sore throat.  Neck:  Denies neck fullness, pain.  Heart:  Denies chest pain, palpitations.  Lungs:  Denies SOB, pleuritic pain, cough.  Gastrointestinal: Denies any abd pain, N/V/D, constipation.  Genitourinary: Denies any dysuria or hematuria  Musculoskeletal: + leg cramps.   Neurologic: Denies headaches, numbness, tingling.  Integumentary : Denies any rashes, lesions, wounds.  Psychiatric: Denies depression or anxiety.   Otherwise all other review of systems are negative    Objective:    Physical Exam:  Vitals:    10/22/20 1332   BP: 122/84   Pulse: 64   Temp: 36.3 C (97.4 F)   TempSrc: Thermal Scan   SpO2: 100%   Weight: 71.9 kg (158 lb 9.6 oz)   Height: 1.575 m (5\' 2" )   BMI: 29.07       General:  Well-appearing in no acute distress.  Skin:  Warm, dry.   Eyes:  Pupils are reactive to light.  EOM intact.  Conjunctiva pink.  No scleral icterus.  HENT:  Gross hearing intact.  Ear canals without cerumen impaction.  Tympanic membranes without erythema, effusion.  No pharyngeal lesions, exudate.  Tongue midline.  Neck:  Supple.  No JVD, thyromegaly, lymphadenopathy.  No carotid bruits.  Heart:  RRR.  No murmur, heave, lift, thrill.  Heart sounds are normal.  No S3, S4 gallop.  Lungs:  Lungs are clear to auscultation bilaterally.  No wheezes, crackles, rubs.  Abdomen:  Nontender. Bowel sounds are normal.  Musculoskeletal:  Back is nontender to palpation.  No clubbing, cyanosis, edema.  Neuro:  Alert, oriented, cooperative.  Gait is stable.  No focal findings.  Psychiatric: Affect upbeat.       Assessment and Plan:  Orders Placed This Encounter   . MAGNESIUM     (E53.8) Low vitamin B12 level  (primary encounter diagnosis)  Plan:  Stable, continue with injections as prescribed.    (E55.9) Vitamin D deficiency  Plan:  Continue with supplement as  prescribed.    (K21.9) Gastroesophageal reflux disease, unspecified whether esophagitis present  Plan:  Stable, continue with Protonix and avoiding trigger foods.  Advised to continue following with gastroenterology as scheduled.    (R51.9) Headache  Plan:  Stable, continue with Topamax as prescribed and following with neurology as scheduled.    (R25.2) Leg cramps  Plan: MAGNESIUM        Patient advised to have magnesium level completed as ordered.  Most recent CMP was in the normal limits.  Further determination be based on results.    (Z68.29) BMI 29.0-29.9,adult  Plan:  Healthy diet activity advised.      Follow Up:  Return in 6 months,  sooner if needed in the interim.  Patient instructed to call the office with questions or concerns in the interim.    This dictation was transcribed using voice recognition software and may contain minor typographical errors.

## 2020-10-22 NOTE — Nursing Note (Signed)
10/22/20 1336   Depression Screen   Little interest or pleasure in doing things. 0   Feeling down, depressed, or hopeless 0   PHQ 2 Total 0

## 2020-10-23 ENCOUNTER — Ambulatory Visit: Payer: No Typology Code available for payment source | Attending: Family Medicine

## 2020-10-23 ENCOUNTER — Encounter (INDEPENDENT_AMBULATORY_CARE_PROVIDER_SITE_OTHER): Payer: Self-pay | Admitting: Physician Assistant

## 2020-10-23 DIAGNOSIS — R252 Cramp and spasm: Secondary | ICD-10-CM

## 2020-10-23 LAB — MAGNESIUM: MAGNESIUM: 2.1 mg/dL (ref 1.5–2.5)

## 2020-10-23 NOTE — Telephone Encounter (Signed)
WORK EXCUSE COMPLETED, INFORMED PT TO PICK UP

## 2020-10-27 ENCOUNTER — Telehealth (INDEPENDENT_AMBULATORY_CARE_PROVIDER_SITE_OTHER): Payer: Self-pay | Admitting: Physician Assistant

## 2020-10-27 DIAGNOSIS — N390 Urinary tract infection, site not specified: Secondary | ICD-10-CM

## 2020-10-27 NOTE — Telephone Encounter (Signed)
Pt instructed to complete UA/C now, increase water intake to hydrate/await results, go to ER for severe symptoms and/or T>101    Pt voices understanding and intent to comply/orders placed in Epic

## 2020-10-27 NOTE — Telephone Encounter (Signed)
PT STATES She HAS SYMPTOMS OF UTI AND HAVING BURNING AND PAIN

## 2020-10-29 ENCOUNTER — Other Ambulatory Visit: Payer: Self-pay

## 2020-10-29 ENCOUNTER — Ambulatory Visit: Payer: No Typology Code available for payment source | Attending: Physician Assistant

## 2020-10-29 DIAGNOSIS — N390 Urinary tract infection, site not specified: Secondary | ICD-10-CM | POA: Insufficient documentation

## 2020-10-29 LAB — URINALYSIS, MACROSCOPIC
BILIRUBIN: NEGATIVE mg/dL
BLOOD: NEGATIVE mg/dL
GLUCOSE: NEGATIVE mg/dL
KETONES: NEGATIVE mg/dL
LEUKOCYTES: NEGATIVE WBCs/uL
NITRITE: NEGATIVE
PH: 7.5 (ref 5.0–9.0)
PROTEIN: NEGATIVE mg/dL
SPECIFIC GRAVITY: 1.015 (ref 1.001–1.030)
UROBILINOGEN: 0.2 mg/dL (ref 0.2–1.0)

## 2020-10-29 LAB — URINALYSIS, MICROSCOPIC: BACTERIA: NEGATIVE /hpf

## 2020-10-30 LAB — URINE CULTURE: URINE CULTURE: 8000 — AB

## 2020-11-02 ENCOUNTER — Encounter (HOSPITAL_COMMUNITY): Payer: Self-pay

## 2020-11-03 ENCOUNTER — Ambulatory Visit (HOSPITAL_BASED_OUTPATIENT_CLINIC_OR_DEPARTMENT_OTHER): Payer: Self-pay

## 2020-11-06 ENCOUNTER — Telehealth (INDEPENDENT_AMBULATORY_CARE_PROVIDER_SITE_OTHER): Payer: Self-pay | Admitting: Family

## 2020-11-06 NOTE — Telephone Encounter (Signed)
LM for pt recent UA/C is negative for infection and no antibiotic is needed/pt instructed to go to ER for severe pain and/or T>101

## 2020-11-06 NOTE — Telephone Encounter (Signed)
HAVING KIDNEY AND BLADDER PAIN LOOKING FOR RESULTS OF URINE

## 2020-11-12 ENCOUNTER — Other Ambulatory Visit (HOSPITAL_COMMUNITY): Payer: Self-pay

## 2020-11-12 DIAGNOSIS — R102 Pelvic and perineal pain: Secondary | ICD-10-CM

## 2020-11-19 ENCOUNTER — Ambulatory Visit (INDEPENDENT_AMBULATORY_CARE_PROVIDER_SITE_OTHER): Payer: Self-pay | Admitting: NURSE PRACTITIONER

## 2020-11-19 ENCOUNTER — Ambulatory Visit (INDEPENDENT_AMBULATORY_CARE_PROVIDER_SITE_OTHER): Payer: No Typology Code available for payment source | Admitting: NURSE PRACTITIONER

## 2020-11-20 ENCOUNTER — Ambulatory Visit (INDEPENDENT_AMBULATORY_CARE_PROVIDER_SITE_OTHER): Payer: No Typology Code available for payment source | Admitting: NURSE PRACTITIONER

## 2020-11-26 ENCOUNTER — Encounter (INDEPENDENT_AMBULATORY_CARE_PROVIDER_SITE_OTHER): Payer: Self-pay | Admitting: Internal Medicine

## 2020-11-26 ENCOUNTER — Ambulatory Visit (INDEPENDENT_AMBULATORY_CARE_PROVIDER_SITE_OTHER): Payer: Self-pay | Admitting: Internal Medicine

## 2020-11-26 ENCOUNTER — Ambulatory Visit (INDEPENDENT_AMBULATORY_CARE_PROVIDER_SITE_OTHER): Payer: No Typology Code available for payment source | Admitting: Internal Medicine

## 2020-11-26 ENCOUNTER — Ambulatory Visit: Payer: No Typology Code available for payment source | Attending: Internal Medicine

## 2020-11-26 ENCOUNTER — Other Ambulatory Visit: Payer: Self-pay

## 2020-11-26 ENCOUNTER — Telehealth (INDEPENDENT_AMBULATORY_CARE_PROVIDER_SITE_OTHER): Payer: Self-pay | Admitting: Family Medicine

## 2020-11-26 VITALS — BP 128/80 | HR 85 | Temp 96.9°F | Ht 62.0 in | Wt 154.6 lb

## 2020-11-26 DIAGNOSIS — R52 Pain, unspecified: Secondary | ICD-10-CM | POA: Insufficient documentation

## 2020-11-26 DIAGNOSIS — Z20822 Contact with and (suspected) exposure to covid-19: Secondary | ICD-10-CM | POA: Insufficient documentation

## 2020-11-26 DIAGNOSIS — M7918 Myalgia, other site: Secondary | ICD-10-CM

## 2020-11-26 DIAGNOSIS — R110 Nausea: Secondary | ICD-10-CM

## 2020-11-26 LAB — RAPID INFLUENZA A/B ANTIGEN
INFLUENZA A ANTIGEN: NEGATIVE
INFLUENZA B ANTIGEN: NEGATIVE

## 2020-11-26 NOTE — Progress Notes (Signed)
PRIMARY CARE, [facility]  [address]  [place] [address]    Chief Complaint:    Chief Complaint   Patient presents with   . Weakness     HPI:  Patient presents for acute visit.  She started feeling sick on Monday, 11/23/2020.  She feels achy all over.  No ill contacts around her recently as far she is aware of.  She has not received COVID vaccine.  No fever or chills.  She has chronic nausea and is being evaluated by specialist in [place].  Nausea is not worse than it usually is.  She did have a protein shake earlier today.  She also reports that she takes in about 100 oz of water a day.  No diarrhea.  Reports that she is scheduled for a gastric emptying study she believes.  That will be completed at an outside facility.  She has not noticed any loss of taste/smell.  No ear pain or sore throat.  No cough.  No runny nose or nasal congestion.  She has used OTC Tylenol and ibuprofen for the achiness.  She reports that she had checked with her specialist office and they did not want her going to the ED if she was able to maintain oral intake.  She is waiting insurance approval on a new medication for nausea.    Past Medical History:   Diagnosis Date   . Anxiety    . Chronic nausea    . Constipation    . Depression    . Endometriosis    . Foot fracture     Right   . Gastroesophageal reflux disease 02/19/2014   . HA (headache)    . Hashimoto's thyroiditis    . Hemorrhage of right ovary 02/19/2015   . History of anesthesia complications     takes a while to wake up   . Hypothyroidism    . Irritable bowel syndrome    . Migraine    . Obesity    . Panic attack    . 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 PONV (postoperative nausea and vomiting)    . Smoker     vapes with nicotine   . UTI (urinary tract infection)    . Vitamin D deficiency      Allergies   Allergen Reactions   . Sulfamethoxazole-Trimethoprim Hives/ Urticaria     Current Outpatient  Medications   Medication Sig   . Cholecalciferol, Vitamin D3, (VITAMIN D-3) 50 mcg (2,000 unit) Oral Capsule Take 1 Capsule by mouth Once a day   . cyanocobalamin (VITAMIN B12) 1,000 mcg/mL Injection Solution Inject 1 mL (1,000 mcg total) under the skin Every 30 days   . medroxyPROGESTERone (DEPO-PROVERA) 150 mg/mL IntraMUSCULAR Suspension INJECT 1 ML (150 MG TOTAL) INTRAMUSCULARLY EVERY 3 MONTHS   . nitrofurantoin macrocrystaL (MACRODANTIN) 50 mg Oral Capsule Take 1 Capsule (50 mg total) by mouth Every night for 90 days After completion of twice a day dosing   . pantoprazole (PROTONIX) 40 mg Oral Tablet, Delayed Release (E.C.) Take 40 mg by mouth Once a day   . Syringe with Needle, Disp, (MONOJECT 3CC SYR 25GX1") 3 mL 25 gauge x 1" Syringe Use once a month   . topiramate (TOPAMAX) 50 mg Oral Tablet Take 50 mg by mouth Every night     ROS:  Constitutional:  No fever or chills.  HENT:  No ear pain,  nasal drainage, congestion or pharyngitis.  Respiratory:  No cough.  GI:  As noted above.    Physical Exam:  BP 128/80   Pulse 85   Temp 36.1 C (96.9 F) (Thermal Scan)   Ht 1.575 m (5\' 2" )   Wt 70.1 kg (154 lb 9.6 oz)   SpO2 97%   BMI 28.28 kg/m     Alert and pleasant.  HEENT:  No icterus or jaundice.  No eye drainage.  TMs without erythema.  Nares patent without drainage.  Tongue bluish-green from her protein shake that she had earlier.  No erythema or exudate.  Neck:  Supple, nontender without adenopathy or masses.  Lungs:  Clear to auscultation bilaterally.  No wheezing, rales or rhonchi.  Chest wall expansion symmetrical.  No cough.  Cardiac:  Regular rate and rhythm.  No murmur, gallop or rub.  Abdomen:  Soft, nontender, non-distended with normoactive bowel sounds.  No appreciable organomegaly.  Extremities:  Warm with no cyanosis or edema.  No erythema.  Capillary refill less than 3 seconds.  Normal skin turgor.      ICD-10-CM    1. Generalized body aches  R52 COVID-19 SCREENING - OUTPATIENT AND DRIVE UP  TESTING     Rapid Influenza A/B Antigen   2. Chronic nausea  R11.0      Suspect viral illness.  Check COVID and influenza testing.  Encouraged to maintain fluid intake.  Advised to proceed to ED if she would have development of fever, inability to maintain oral intake, respiratory distress or cyanosis.  She will follow-up with her specialist regarding testing for additional workup regarding her chronic nausea.  She is awaiting insurance authorization/approval for new medication to assist with the nausea.  She is not hypotensive.  Will make further recommendations based on testing results.  May use Tylenol as needed for discomfort.  Recommend avoidance of ibuprofen given presence of nausea.  Follow-up as needed.    , MD    This dictation was transcribed using voice recognition software and may contain minor typographical errors.

## 2020-11-26 NOTE — Nursing Note (Signed)
Patient is here today for weakness, nausea, and dizziness that has been going on since 8/1.

## 2020-11-26 NOTE — Telephone Encounter (Signed)
Pt aware.

## 2020-11-26 NOTE — Telephone Encounter (Signed)
-----   Message from Bufford Buttner, MD sent at 11/26/2020 12:47 PM EDT -----  Please notify patient influenza testing negative.

## 2020-11-27 LAB — COVID-19 SCREENING - SEND-OUT: SARS-COV-2 RNA: NOT DETECTED

## 2020-12-02 ENCOUNTER — Ambulatory Visit (HOSPITAL_BASED_OUTPATIENT_CLINIC_OR_DEPARTMENT_OTHER): Payer: Self-pay

## 2020-12-03 ENCOUNTER — Inpatient Hospital Stay
Admission: RE | Admit: 2020-12-03 | Discharge: 2020-12-03 | Disposition: A | Discharge status: Home / Self Care | Payer: No Typology Code available for payment source | Source: Ambulatory Visit

## 2020-12-03 ENCOUNTER — Other Ambulatory Visit: Payer: Self-pay

## 2020-12-03 DIAGNOSIS — R102 Pelvic and perineal pain: Secondary | ICD-10-CM | POA: Insufficient documentation

## 2020-12-14 ENCOUNTER — Encounter (INDEPENDENT_AMBULATORY_CARE_PROVIDER_SITE_OTHER): Payer: Self-pay | Admitting: NURSE PRACTITIONER

## 2020-12-14 ENCOUNTER — Other Ambulatory Visit: Payer: Self-pay

## 2020-12-14 ENCOUNTER — Ambulatory Visit (INDEPENDENT_AMBULATORY_CARE_PROVIDER_SITE_OTHER): Payer: No Typology Code available for payment source | Admitting: NURSE PRACTITIONER

## 2020-12-14 VITALS — BP 126/80 | HR 87 | Temp 98.6°F | Ht 62.0 in | Wt 154.0 lb

## 2020-12-14 DIAGNOSIS — F419 Anxiety disorder, unspecified: Secondary | ICD-10-CM

## 2020-12-14 DIAGNOSIS — Z6828 Body mass index (BMI) 28.0-28.9, adult: Secondary | ICD-10-CM

## 2020-12-14 MED ORDER — BUSPIRONE 7.5 MG TABLET
7.5000 mg | ORAL_TABLET | Freq: Two times a day (BID) | ORAL | 1 refills | Status: DC
Start: 2020-12-14 — End: 2022-03-02

## 2020-12-14 NOTE — Progress Notes (Signed)
PRIMARY CARE, [facility]  [address] [place] [address]  Phone: [phone]  Fax: [phone]    Encounter Date: 12/14/2020    Patient ID:  Traci [name]  KYH:[id number]    DOB: 01-18-2001  Age: 20 y.o. female    Subjective:     Chief Complaint   Patient presents with   . Anxiety     The patient presents today for an acute appointment.  She has concerns regarding her anxiety.  Anxiety has always been a chronic problem.  She has been trialed on multiple medications in the past as well as had a referral placed for psychiatry.  Medication she has tried in the past include Effexor, hydroxyzine, and Lexapro.  Today, she denies depressive thoughts.  She denies suicidal homicidal ideations.  She tells me her anxiety is really bad.  She gets anxious and feels her heart racing.  She needs to have a referral for a new therapist.  She was doing well with therapy.  She tells me she was recently dismissed from therapy due to missing appointments.  She will need to have a new referral.  Additionally, she tells me both of her brothers whom she lives in the same house with tested positive for COVID on Saturday.  She currently has no COVID symptoms.        Current Outpatient Medications   Medication Sig   . Cholecalciferol, Vitamin D3, (VITAMIN D-3) 50 mcg (2,000 unit) Oral Capsule Take 1 Capsule by mouth Once a day   . cyanocobalamin (VITAMIN B12) 1,000 mcg/mL Injection Solution Inject 1 mL (1,000 mcg total) under the skin Every 30 days   . medroxyPROGESTERone (DEPO-PROVERA) 150 mg/mL IntraMUSCULAR Suspension INJECT 1 ML (150 MG TOTAL) INTRAMUSCULARLY EVERY 3 MONTHS   . pantoprazole (PROTONIX) 40 mg Oral Tablet, Delayed Release (E.C.) Take 40 mg by mouth Once a day   . promethazine (PHENERGAN) 25 mg Oral Tablet TAKE ONE TABLET BY MOUTH EVERY 8 HOURS FOR 30 DAYS   . Syringe with Needle, Disp, (MONOJECT 3CC SYR 25GX1") 3 mL 25 gauge x 1" Syringe Use once a month   . topiramate (TOPAMAX) 50 mg Oral  Tablet Take 50 mg by mouth Every night     Allergies   Allergen Reactions   . Sulfamethoxazole-Trimethoprim Hives/ Urticaria     Past Medical History:   Diagnosis Date   . Anxiety    . Chronic nausea    . Constipation    . Depression    . Endometriosis    . Foot fracture     Right   . Gastroesophageal reflux disease 02/19/2014   . HA (headache)    . Hashimoto's thyroiditis    . Hemorrhage of right ovary 02/19/2015   . History of anesthesia complications     takes a while to wake up   . Hypothyroidism    . Irritable bowel syndrome    . Migraine    . Obesity    . Panic attack    . 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 PONV (postoperative nausea and vomiting)    . Smoker     vapes with nicotine   . UTI (urinary tract infection)    . Vitamin D deficiency          Past Surgical History:   Procedure Laterality Date   . FOOT SURGERY Right    . HX APPENDECTOMY     .  HX CHOLECYSTECTOMY     . HX DENTAL EXTRACTION     . HX FOOT SURGERY     . HX OTHER  10/30/2015    DIAG LAPARO SEPARATE PROC - fulgration of endometriotic implants   . HX OTHER      ORIF Right foot   . HX TONSIL AND ADENOIDECTOMY     . HX TONSILLECTOMY     . LAPAROSCOPIC ENDOMETRIOSIS FULGURATION     . LAPAROSCOPIC ENDOMETRIOSIS FULGURATION      2nd surgery was to also drain a blood clot.         Family Medical History:     Problem Relation (Age of Onset)    Alopecia Sister    COLITIS Brother    Hypertension (High Blood Pressure) Mother    Migraines Mother    No Known Problems Father    Thyroid Disease Mother          Social History     Tobacco Use   . Smoking status: Never Smoker   . Smokeless tobacco: Current User   Vaping Use   . Vaping Use: Every day   . Substances: Nicotine   . Devices: Pre-filled pod   Substance Use Topics   . Alcohol use: Yes     Comment: occasionally   . Drug use: Yes     Types: Marijuana     Comment: has medical marijuana card; smokes marijuana  2 times a day       Review of Systems    Constitutional: Negative.    HENT: Negative.    Eyes: Negative.    Respiratory: Negative.    Cardiovascular: Negative.    Gastrointestinal: Negative.    Genitourinary: Negative.    Musculoskeletal: Negative.    Skin: Negative.    Neurological: Negative.    Hematological: Negative.    Psychiatric/Behavioral: The patient is nervous/anxious.      Objective:   Vitals: BP 126/80 (Site: Left, Patient Position: Sitting, Cuff Size: Adult Large)   Pulse 87   Temp 37 C (98.6 F) (Thermal Scan)   Ht 1.575 m (5\' 2" )   Wt 69.9 kg (154 lb)   SpO2 100%   BMI 28.17 kg/m         Physical Exam  Constitutional:       Appearance: Normal appearance.   HENT:      Head: Normocephalic.      Right Ear: Tympanic membrane normal.      Left Ear: Tympanic membrane normal.      Nose: Nose normal.      Mouth/Throat:      Mouth: Mucous membranes are moist.   Eyes:      Extraocular Movements: Extraocular movements intact.      Pupils: Pupils are equal, round, and reactive to light.   Cardiovascular:      Rate and Rhythm: Normal rate and regular rhythm.      Pulses: Normal pulses.      Heart sounds: Normal heart sounds.   Pulmonary:      Effort: Pulmonary effort is normal.      Breath sounds: Normal breath sounds.   Abdominal:      Palpations: Abdomen is soft.      Tenderness: There is no abdominal tenderness.   Musculoskeletal:         General: Normal range of motion.      Cervical back: Normal range of motion and neck supple.   Skin:     General: Skin is  warm and dry.      Capillary Refill: Capillary refill takes less than 2 seconds.   Neurological:      General: No focal deficit present.      Mental Status: She is alert and oriented to person, place, and time.   Psychiatric:         Mood and Affect: Mood normal.         Behavior: Behavior normal.         Assessment & Plan:       ICD-10-CM    1. Anxiety  F41.9 Referral to External Provider (AMB)   2. BMI 28.0-28.9,adult  Z68.28      Orders Placed This Encounter   . Referral to External  Provider (AMB)   . busPIRone (BUSPAR) 7.5 mg Oral Tablet     The patient presents today with worsening anxiety.  I did provide a prescription of BuSpar patient.  She was advised to take twice daily as prescribed.  A referral was placed for psychiatry for further evaluation.  Advised to follow-up in 1 month, sooner if needed in the interim.    [employer], CRNP

## 2020-12-14 NOTE — Nursing Note (Signed)
Patient states she has been having bad anxiety. States her heart will race and she feels like passing out.   Patient was exposed to covid, he brothers tested positive on Saturday.

## 2020-12-16 ENCOUNTER — Ambulatory Visit (INDEPENDENT_AMBULATORY_CARE_PROVIDER_SITE_OTHER): Payer: Self-pay | Admitting: Family Medicine

## 2020-12-16 NOTE — Telephone Encounter (Signed)
Called back. vm left.

## 2020-12-16 NOTE — Telephone Encounter (Signed)
Pt called in. Reports sx of severe SOB, tachycardia, fever. Instructed to go to ER.

## 2020-12-16 NOTE — Telephone Encounter (Signed)
Regarding: Advise/[place]  ----- Message from Gillian Shields sent at 12/16/2020  8:47 AM EDT -----  Peter Minium, MD     Hello!  The pt had a positive home covid test and would like to be tested at [facility]. Please advise the pt at [phone]   Thanks!  Gillian Shields

## 2020-12-16 NOTE — Telephone Encounter (Signed)
Addressed in another TE

## 2021-01-11 ENCOUNTER — Encounter (INDEPENDENT_AMBULATORY_CARE_PROVIDER_SITE_OTHER): Payer: Self-pay | Admitting: NURSE PRACTITIONER

## 2021-01-11 ENCOUNTER — Encounter (INDEPENDENT_AMBULATORY_CARE_PROVIDER_SITE_OTHER): Payer: No Typology Code available for payment source | Admitting: NURSE PRACTITIONER

## 2021-01-12 ENCOUNTER — Encounter (INDEPENDENT_AMBULATORY_CARE_PROVIDER_SITE_OTHER): Payer: No Typology Code available for payment source | Admitting: NURSE PRACTITIONER

## 2021-01-13 ENCOUNTER — Encounter (INDEPENDENT_AMBULATORY_CARE_PROVIDER_SITE_OTHER): Payer: No Typology Code available for payment source | Admitting: NURSE PRACTITIONER

## 2021-01-15 ENCOUNTER — Encounter (INDEPENDENT_AMBULATORY_CARE_PROVIDER_SITE_OTHER): Payer: Self-pay | Admitting: NURSE PRACTITIONER

## 2021-01-15 ENCOUNTER — Ambulatory Visit (INDEPENDENT_AMBULATORY_CARE_PROVIDER_SITE_OTHER): Payer: No Typology Code available for payment source | Admitting: NURSE PRACTITIONER

## 2021-01-15 ENCOUNTER — Other Ambulatory Visit: Payer: Self-pay

## 2021-01-15 VITALS — BP 110/82 | HR 76 | Temp 96.8°F | Ht 62.0 in | Wt 154.0 lb

## 2021-01-15 DIAGNOSIS — Z6828 Body mass index (BMI) 28.0-28.9, adult: Secondary | ICD-10-CM

## 2021-01-15 DIAGNOSIS — J029 Acute pharyngitis, unspecified: Secondary | ICD-10-CM

## 2021-01-15 DIAGNOSIS — F419 Anxiety disorder, unspecified: Secondary | ICD-10-CM

## 2021-01-15 NOTE — Nursing Note (Signed)
Patient is here today for a follow up. Patient came into today with a sore throat with no other symptoms states she took two at home covid tests and both were negative.

## 2021-01-15 NOTE — Progress Notes (Signed)
PRIMARY CARE, [facility]  [address] [place] [address]  Phone: [phone]  Fax: [phone]    Encounter Date: 01/15/2021    Patient ID:  Traci [name]  BUL:[id number]    DOB: [birth year]  Age: 20 y.o. female    Subjective:     Chief Complaint   Patient presents with   . Follow Up     The patient presents today for anxiety follow-up.  She was seen on December 14, 2020 with complaints of increased anxiety.  She was started on BuSpar at that time.  Today, she tells me since beginning BuSpar her anxiety has improved.  She does have an appointment set up with therapist next week.  She denies depressed thoughts.  She denies suicidal homicidal ideations.  Today, she tells me she has had a mild sore throat for the past 2 days.  She tested herself for COVID at home a was negative.  There is no fever or chills.  There is no cough.  She denies shortness of breath.  She is eating and drinking per her usual.        Current Outpatient Medications   Medication Sig   . busPIRone (BUSPAR) 7.5 mg Oral Tablet Take 1 Tablet (7.5 mg total) by mouth Twice daily   . Cholecalciferol, Vitamin D3, (VITAMIN D-3) 50 mcg (2,000 unit) Oral Capsule Take 1 Capsule by mouth Once a day   . cyanocobalamin (VITAMIN B12) 1,000 mcg/mL Injection Solution Inject 1 mL (1,000 mcg total) under the skin Every 30 days   . medroxyPROGESTERone (DEPO-PROVERA) 150 mg/mL IntraMUSCULAR Suspension INJECT 1 ML (150 MG TOTAL) INTRAMUSCULARLY EVERY 3 MONTHS   . pantoprazole (PROTONIX) 40 mg Oral Tablet, Delayed Release (E.C.) Take 40 mg by mouth Once a day   . promethazine (PHENERGAN) 25 mg Oral Tablet TAKE ONE TABLET BY MOUTH EVERY 8 HOURS FOR 30 DAYS   . Syringe with Needle, Disp, (MONOJECT 3CC SYR 25GX1") 3 mL 25 gauge x 1" Syringe Use once a month   . topiramate (TOPAMAX) 50 mg Oral Tablet Take 50 mg by mouth Every night     Allergies   Allergen Reactions   . Sulfamethoxazole-Trimethoprim Hives/ Urticaria     Past Medical History:    Diagnosis Date   . Anxiety    . Chronic nausea    . Constipation    . Depression    . Endometriosis    . Foot fracture     Right   . Gastroesophageal reflux disease 02/19/2014   . HA (headache)    . Hashimoto's thyroiditis    . Hemorrhage of right ovary 02/19/2015   . History of anesthesia complications     takes a while to wake up   . Hypothyroidism    . Irritable bowel syndrome    . Migraine    . Obesity    . Panic attack    . 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 PONV (postoperative nausea and vomiting)    . Smoker     vapes with nicotine   . UTI (urinary tract infection)    . Vitamin D deficiency          Past Surgical History:   Procedure Laterality Date   . FOOT SURGERY Right    . HX APPENDECTOMY     . HX CHOLECYSTECTOMY     . HX DENTAL EXTRACTION     .  HX FOOT SURGERY     . HX OTHER  10/30/2015    DIAG LAPARO SEPARATE PROC - fulgration of endometriotic implants   . HX OTHER      ORIF Right foot   . HX TONSIL AND ADENOIDECTOMY     . HX TONSILLECTOMY     . LAPAROSCOPIC ENDOMETRIOSIS FULGURATION     . LAPAROSCOPIC ENDOMETRIOSIS FULGURATION      2nd surgery was to also drain a blood clot.         Family Medical History:     Problem Relation (Age of Onset)    Alopecia Sister    COLITIS Brother    Hypertension (High Blood Pressure) Mother    Migraines Mother    No Known Problems Father    Thyroid Disease Mother          Social History     Tobacco Use   . Smoking status: Never Smoker   . Smokeless tobacco: Current User   Vaping Use   . Vaping Use: Every day   . Substances: Nicotine   . Devices: Pre-filled pod   Substance Use Topics   . Alcohol use: Yes     Comment: occasionally   . Drug use: Yes     Types: Marijuana     Comment: has medical marijuana card; smokes marijuana  2 times a day       Review of Systems   Constitutional: Negative.    HENT: Positive for sore throat.    Eyes: Negative.    Respiratory: Negative.    Cardiovascular: Negative.    Gastrointestinal:  Negative.    Endocrine: Negative.    Genitourinary: Negative.    Musculoskeletal: Negative.    Skin: Negative.    Neurological: Negative.    Hematological: Negative.    Psychiatric/Behavioral: Negative.      Objective:   Vitals: BP 110/82   Pulse 76   Temp 36 C (96.8 F) (Thermal Scan)   Ht 1.575 m (5\' 2" )   Wt 69.9 kg (154 lb)   SpO2 99%   BMI 28.17 kg/m         Physical Exam  Constitutional:       Appearance: Normal appearance.   HENT:      Head: Normocephalic.      Right Ear: Tympanic membrane normal.      Left Ear: Tympanic membrane normal.      Nose: Nose normal.      Mouth/Throat:      Mouth: Mucous membranes are moist.      Pharynx: Oropharynx is clear. No oropharyngeal exudate or posterior oropharyngeal erythema.   Eyes:      Extraocular Movements: Extraocular movements intact.      Pupils: Pupils are equal, round, and reactive to light.   Cardiovascular:      Rate and Rhythm: Normal rate and regular rhythm.      Pulses: Normal pulses.      Heart sounds: Normal heart sounds.   Pulmonary:      Effort: Pulmonary effort is normal.      Breath sounds: Normal breath sounds.   Abdominal:      Palpations: Abdomen is soft.      Tenderness: There is no abdominal tenderness.   Musculoskeletal:         General: Normal range of motion.      Cervical back: Normal range of motion and neck supple.   Skin:     General: Skin is warm and dry.  Capillary Refill: Capillary refill takes less than 2 seconds.   Neurological:      General: No focal deficit present.      Mental Status: She is alert and oriented to person, place, and time.   Psychiatric:         Mood and Affect: Mood normal.         Behavior: Behavior normal.         Assessment & Plan:       ICD-10-CM    1. Anxiety  F41.9    2. Sore throat  J02.9    3. BMI 28.0-28.9,adult  Z68.28      No orders of the defined types were placed in this encounter.    Anxiety curve is stable with current BuSpar.  She was advised to continue taking BuSpar as prescribed.   Advised to follow-up with therapist as scheduled next week.  Regarding sore throat, there is no erythema or exudate.  She is afebrile in the office today.  Advised most likely viral pharyngitis.  She was advised on symptomatic treatment with Tylenol and or Motrin for discomfort and warm saline gargles.  Her home COVID test was negative.  Advised to return to clinic if no improvement or worsening of symptoms.    [employer], CRNP

## 2021-01-19 ENCOUNTER — Other Ambulatory Visit (INDEPENDENT_AMBULATORY_CARE_PROVIDER_SITE_OTHER): Payer: Self-pay | Admitting: Urology

## 2021-01-19 MED ORDER — NITROFURANTOIN MACROCRYSTAL 50 MG CAPSULE
50.0000 mg | ORAL_CAPSULE | Freq: Every evening | ORAL | 2 refills | Status: DC
Start: 2021-01-19 — End: 2021-03-11

## 2021-02-04 ENCOUNTER — Other Ambulatory Visit: Payer: Self-pay

## 2021-02-04 ENCOUNTER — Ambulatory Visit (INDEPENDENT_AMBULATORY_CARE_PROVIDER_SITE_OTHER): Payer: Self-pay | Admitting: NURSE PRACTITIONER

## 2021-02-04 ENCOUNTER — Ambulatory Visit (INDEPENDENT_AMBULATORY_CARE_PROVIDER_SITE_OTHER): Payer: No Typology Code available for payment source | Admitting: NURSE PRACTITIONER

## 2021-02-04 ENCOUNTER — Encounter (INDEPENDENT_AMBULATORY_CARE_PROVIDER_SITE_OTHER): Payer: Self-pay | Admitting: NURSE PRACTITIONER

## 2021-02-04 VITALS — BP 118/80 | HR 67 | Temp 98.0°F | Ht 62.0 in | Wt 150.8 lb

## 2021-02-04 DIAGNOSIS — J339 Nasal polyp, unspecified: Secondary | ICD-10-CM

## 2021-02-04 DIAGNOSIS — F17290 Nicotine dependence, other tobacco product, uncomplicated: Secondary | ICD-10-CM

## 2021-02-04 DIAGNOSIS — H9319 Tinnitus, unspecified ear: Secondary | ICD-10-CM

## 2021-02-04 DIAGNOSIS — H9313 Tinnitus, bilateral: Secondary | ICD-10-CM

## 2021-02-04 DIAGNOSIS — Z6827 Body mass index (BMI) 27.0-27.9, adult: Secondary | ICD-10-CM

## 2021-02-04 DIAGNOSIS — R040 Epistaxis: Secondary | ICD-10-CM

## 2021-02-04 NOTE — Progress Notes (Signed)
PRIMARY CARE, [facility]  [address] [place] [address]  Phone: [phone]  Fax: [phone]    Encounter Date: 02/04/2021    Patient ID:  Traci [name]  YOV:[id number]    DOB: 04-Jan-2001  Age: 20 y.o. female    Subjective:     Chief Complaint   Patient presents with   . Nose Bleed     The patient presents today for an acute appointment.  She is complaining of ringing in both of her ears and nose bleeds on and off for the past 1 week.  She denies any injury.  She does tell me she feels as though she could feel her "heart beating in her head".  She did have a CT of the brain done in February for a different complaint (syncope) which was negative.  There has been no syncope or near syncope. She denies chest pain or shortness of breath.          Current Outpatient Medications   Medication Sig   . busPIRone (BUSPAR) 7.5 mg Oral Tablet Take 1 Tablet (7.5 mg total) by mouth Twice daily   . Cholecalciferol, Vitamin D3, (VITAMIN D-3) 50 mcg (2,000 unit) Oral Capsule Take 1 Capsule by mouth Once a day   . cyanocobalamin (VITAMIN B12) 1,000 mcg/mL Injection Solution Inject 1 mL (1,000 mcg total) under the skin Every 30 days   . medroxyPROGESTERone (DEPO-PROVERA) 150 mg/mL IntraMUSCULAR Suspension INJECT 1 ML (150 MG TOTAL) INTRAMUSCULARLY EVERY 3 MONTHS   . nitrofurantoin macrocrystaL (MACRODANTIN) 50 mg Oral Capsule Take 1 Capsule (50 mg total) by mouth Every night for 90 days Keep 03/11/21 appointment for further refills   . pantoprazole (PROTONIX) 40 mg Oral Tablet, Delayed Release (E.C.) Take 40 mg by mouth Once a day   . promethazine (PHENERGAN) 25 mg Oral Tablet TAKE ONE TABLET BY MOUTH EVERY 8 HOURS FOR 30 DAYS   . Syringe with Needle, Disp, (MONOJECT 3CC SYR 25GX1") 3 mL 25 gauge x 1" Syringe Use once a month   . topiramate (TOPAMAX) 50 mg Oral Tablet Take 50 mg by mouth Every night     Allergies   Allergen Reactions   . Sulfamethoxazole-Trimethoprim Hives/ Urticaria     Past Medical  History:   Diagnosis Date   . Anxiety    . Chronic nausea    . Constipation    . Depression    . Endometriosis    . Foot fracture     Right   . Gastroesophageal reflux disease 02/19/2014   . HA (headache)    . Hashimoto's thyroiditis    . Hemorrhage of right ovary 02/19/2015   . History of anesthesia complications     takes a while to wake up   . Hypothyroidism    . Irritable bowel syndrome    . Migraine    . Obesity    . Panic attack    . 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 PONV (postoperative nausea and vomiting)    . Smoker     vapes with nicotine   . UTI (urinary tract infection)    . Vitamin D deficiency          Past Surgical History:   Procedure Laterality Date   . FOOT SURGERY Right    . HX APPENDECTOMY     . HX CHOLECYSTECTOMY     . HX DENTAL EXTRACTION     .  HX FOOT SURGERY     . HX OTHER  10/30/2015    DIAG LAPARO SEPARATE PROC - fulgration of endometriotic implants   . HX OTHER      ORIF Right foot   . HX TONSIL AND ADENOIDECTOMY     . HX TONSILLECTOMY     . LAPAROSCOPIC ENDOMETRIOSIS FULGURATION     . LAPAROSCOPIC ENDOMETRIOSIS FULGURATION      2nd surgery was to also drain a blood clot.         Family Medical History:     Problem Relation (Age of Onset)    Alopecia Sister    COLITIS Brother    Hypertension (High Blood Pressure) Mother    Migraines Mother    No Known Problems Father    Thyroid Disease Mother          Social History     Tobacco Use   . Smoking status: Never   . Smokeless tobacco: Current   Vaping Use   . Vaping Use: Every day   . Substances: Nicotine   . Devices: Pre-filled pod   Substance Use Topics   . Alcohol use: Yes     Comment: occasionally   . Drug use: Yes     Types: Marijuana     Comment: has medical marijuana card; smokes marijuana  2 times a day       Review of Systems   Constitutional: Negative.    HENT: Positive for nosebleeds and tinnitus.    Eyes: Negative.    Respiratory: Negative.    Cardiovascular: Negative.     Gastrointestinal: Negative.    Endocrine: Negative.    Genitourinary: Negative.    Musculoskeletal: Negative.    Skin: Negative.    Neurological: Negative.    Hematological: Negative.    Psychiatric/Behavioral: Negative.      Objective:   Vitals: BP 118/80   Pulse 67   Temp 36.7 C (98 F) (Thermal Scan)   Ht 1.575 m (5\' 2" )   Wt 68.4 kg (150 lb 12.8 oz)   SpO2 98%   BMI 27.58 kg/m         Physical Exam  Constitutional:       Appearance: Normal appearance.   HENT:      Head: Normocephalic.      Right Ear: Tympanic membrane normal.      Left Ear: Tympanic membrane normal.      Nose: Nose normal. No congestion or rhinorrhea.      Comments: + nasal poly left nare     Mouth/Throat:      Mouth: Mucous membranes are moist.   Eyes:      Pupils: Pupils are equal, round, and reactive to light.   Cardiovascular:      Rate and Rhythm: Normal rate and regular rhythm.      Pulses: Normal pulses.      Heart sounds: Normal heart sounds.   Pulmonary:      Effort: Pulmonary effort is normal.      Breath sounds: Normal breath sounds.   Abdominal:      Palpations: Abdomen is soft.      Tenderness: There is no abdominal tenderness.   Musculoskeletal:         General: Normal range of motion.      Cervical back: Normal range of motion and neck supple.   Skin:     General: Skin is warm and dry.      Capillary Refill: Capillary refill takes less  than 2 seconds.   Neurological:      General: No focal deficit present.      Mental Status: She is alert and oriented to person, place, and time.   Psychiatric:         Mood and Affect: Mood normal.         Behavior: Behavior normal.         Assessment & Plan:       ICD-10-CM    1. Tinnitus, unspecified laterality  H93.19 CBC/DIFF     Refer to [facility]. [facility]      2. Epistaxis  R04.0 CBC/DIFF     Refer to [facility] [employer] [facility]. [facility]      3. Nasal polyp  J33.9 CBC/DIFF     Refer to [facility] [employer] [facility]. [facility]       4. BMI 27.0-27.9,adult  Z68.27         Orders Placed This Encounter   . CBC/DIFF   . Refer to [facility]. [facility]     It does appear as though the patient has a nasal polyp in the left nare.  Will place referral to ENT for further evaluation.  Additionally, I did order CBC for the patient.  She did recently just had PT PTT completed which were within normal limits back in June.  Advised patient should symptoms worsen to return to clinic and she verbalized understanding.    [employer], CRNP

## 2021-02-04 NOTE — Nursing Note (Signed)
Patient is here today for nosebleeds, ringing in her ears, and seeing spots. States it is al mainly in the AM states she can also feel her heartbeat in her head at times.

## 2021-02-15 ENCOUNTER — Ambulatory Visit (INDEPENDENT_AMBULATORY_CARE_PROVIDER_SITE_OTHER): Payer: Self-pay | Admitting: Otolaryngology

## 2021-02-19 ENCOUNTER — Ambulatory Visit (INDEPENDENT_AMBULATORY_CARE_PROVIDER_SITE_OTHER): Payer: No Typology Code available for payment source | Admitting: Otolaryngology

## 2021-03-09 ENCOUNTER — Other Ambulatory Visit: Payer: Self-pay

## 2021-03-09 ENCOUNTER — Emergency Department (HOSPITAL_COMMUNITY): Payer: No Typology Code available for payment source

## 2021-03-09 ENCOUNTER — Emergency Department
Admission: EM | Admit: 2021-03-09 | Discharge: 2021-03-09 | Disposition: A | Discharge status: Home / Self Care | Payer: No Typology Code available for payment source | Attending: Physician Assistant | Admitting: Physician Assistant

## 2021-03-09 ENCOUNTER — Encounter (HOSPITAL_COMMUNITY): Payer: Self-pay

## 2021-03-09 DIAGNOSIS — F17290 Nicotine dependence, other tobacco product, uncomplicated: Secondary | ICD-10-CM | POA: Insufficient documentation

## 2021-03-09 DIAGNOSIS — Z87440 Personal history of urinary (tract) infections: Secondary | ICD-10-CM

## 2021-03-09 DIAGNOSIS — N809 Endometriosis, unspecified: Secondary | ICD-10-CM | POA: Insufficient documentation

## 2021-03-09 DIAGNOSIS — R609 Edema, unspecified: Secondary | ICD-10-CM | POA: Insufficient documentation

## 2021-03-09 DIAGNOSIS — N201 Calculus of ureter: Secondary | ICD-10-CM | POA: Insufficient documentation

## 2021-03-09 LAB — CBC WITH DIFF
BASOPHIL #: <0.1 10*3/uL (ref <=0.20)
BASOPHIL %: 0 %
EOSINOPHIL #: <0.1 10*3/uL (ref <=0.50)
EOSINOPHIL %: 1 %
HCT: 41.3 % (ref 34.8–46.0)
HGB: 14 g/dL (ref 11.5–16.0)
IMMATURE GRANULOCYTE #: <0.1 10*3/uL (ref <0.10)
IMMATURE GRANULOCYTE %: 0 % (ref 0–1)
LYMPHOCYTE #: 2.68 10*3/uL (ref 1.00–4.80)
LYMPHOCYTE %: 33 %
MCH: 29.3 pg (ref 26.0–32.0)
MCHC: 33.9 g/dL (ref 31.0–35.5)
MCV: 86.4 fL (ref 78.0–100.0)
MONOCYTE #: 0.53 10*3/uL (ref 0.20–1.10)
MONOCYTE %: 6 %
MPV: 11.9 fL (ref 8.7–12.5)
NEUTROPHIL #: 4.92 10*3/uL (ref 1.50–7.70)
NEUTROPHIL %: 60 %
PLATELETS: 247 10*3/uL (ref 150–400)
RBC: 4.78 10*6/uL (ref 3.85–5.22)
RDW-CV: 13.4 % (ref 11.5–15.5)
WBC: 8.2 10*3/uL (ref 3.7–11.0)

## 2021-03-09 LAB — URINALYSIS, MACRO/MICRO
BILIRUBIN: NEGATIVE mg/dL
GLUCOSE: NEGATIVE mg/dL
LEUKOCYTES: NEGATIVE WBCs/uL
NITRITE: POSITIVE — AB
PH: 6 (ref 5.0–8.0)
PROTEIN: 100 mg/dL — AB
SPECIFIC GRAVITY: 1.025 (ref 1.005–?)
UROBILINOGEN: 0.2 mg/dL (ref 0.2–1.0)

## 2021-03-09 LAB — COMPREHENSIVE METABOLIC PANEL, NON-FASTING
ALBUMIN/GLOBULIN RATIO: 1.9 (ref >=1.0)
ALBUMIN: 4.6 g/dL (ref 3.5–5.2)
ALKALINE PHOSPHATASE: 42 U/L (ref 41–133)
ALT (SGPT): 8 U/L (ref 0–55)
ANION GAP: 8 mmol/L (ref 6–15)
AST (SGOT): 10 U/L (ref 5–34)
BILIRUBIN TOTAL: 0.6 mg/dL (ref 0.2–1.2)
BUN: 8 mg/dL (ref 7–21)
CALCIUM: 9.4 mg/dL (ref 8.0–10.6)
CHLORIDE: 106 mmol/L (ref 98–107)
CO2 TOTAL: 26 mmol/L (ref 21–32)
CREATININE: 0.78 mg/dL — ABNORMAL LOW (ref 0.80–1.60)
ESTIMATED GFR: >60 mL/min/{1.73_m2}
GLUCOSE: 69 mg/dL — ABNORMAL LOW (ref 70–100)
POTASSIUM: 3.6 mmol/L (ref 3.3–5.1)
PROTEIN TOTAL: 7 g/dL (ref 6.4–8.3)
SODIUM: 140 mmol/L (ref 136–146)

## 2021-03-09 LAB — URINALYSIS, MICROSCOPIC: RBCS: >100 /hpf — AB

## 2021-03-09 LAB — HCG, URINE QUALITATIVE, PREGNANCY
HCG URINE QUALITATIVE: NEGATIVE
SPECIFIC GRAVITY: 1.021 (ref 1.000–1.030)

## 2021-03-09 LAB — RED TOP TUBE

## 2021-03-09 LAB — LIPASE: LIPASE: 35 U/L (ref 8–78)

## 2021-03-09 MED ORDER — KETOROLAC 30 MG/ML (1 ML) INJECTION SOLUTION
30.0000 mg | INTRAMUSCULAR | Status: AC
Start: 2021-03-09 — End: 2021-03-09
  Administered 2021-03-09: 11:00:00 30 mg via INTRAVENOUS
  Filled 2021-03-09: qty 1

## 2021-03-09 MED ORDER — CEFDINIR 300 MG CAPSULE
300.0000 mg | ORAL_CAPSULE | Freq: Two times a day (BID) | ORAL | 0 refills | Status: DC
Start: 2021-03-09 — End: 2021-03-09

## 2021-03-09 MED ORDER — ONDANSETRON HCL (PF) 4 MG/2 ML INJECTION SOLUTION
4.0000 mg | INTRAMUSCULAR | Status: AC
Start: 2021-03-09 — End: 2021-03-09
  Administered 2021-03-09: 11:00:00 4 mg via INTRAVENOUS
  Filled 2021-03-09: qty 2

## 2021-03-09 MED ORDER — SODIUM CHLORIDE 0.9 % INTRAVENOUS PIGGYBACK
1.0000 g | INTRAVENOUS | Status: AC
Start: 2021-03-09 — End: 2021-03-09
  Administered 2021-03-09: 13:00:00 0 g via INTRAVENOUS
  Administered 2021-03-09: 1 g via INTRAVENOUS
  Filled 2021-03-09: qty 10

## 2021-03-09 MED ORDER — CEFDINIR 125 MG/5 ML ORAL SUSPENSION
300.0000 mg | INHALATION_SUSPENSION | Freq: Two times a day (BID) | ORAL | 0 refills | Status: AC
Start: 2021-03-09 — End: 2021-03-19

## 2021-03-09 NOTE — ED Provider Notes (Signed)
[facility]       Name: Traci [name]  Age and Gender: 20 y.o. female  PCP: [name], MD    Chief Complaint:  Patient presents with     Chief Complaint   Patient presents with   . Abdominal Pain       HPI:    Traci [name], date of birth [birth year], is a 20 y.o. female who presents to the Emergency Department with a chief complaint of RLQ pain.  It began last night, but worsened significantly this morning.  Associated hematuria.  Denies fever, chills, vomiting or diarrhea.  Hx of chronic nausea - is having GI work up for this currently - today feels no different.  Hx of endometriosis s/p 2 ex-laps, post-op abd hematoma requiring wash out.  Also frequent UTIs on ppx Macrodantin.  CHART LISTS CHOLECYSTECTOMY, APPENDECTOMY.      ROS:   Review of Systems   Constitutional: Negative for chills and fever.   Respiratory: Negative for shortness of breath.    Cardiovascular: Negative for chest pain.   Gastrointestinal: Positive for abdominal pain and nausea. Negative for diarrhea and vomiting.   Genitourinary: Positive for hematuria. Negative for difficulty urinating and dysuria.   Neurological: Negative for dizziness and headaches.         PE:   ED Triage Vitals [03/09/21 0836]   BP (Non-Invasive) (!) 139/101   Heart Rate 84   Respiratory Rate 18   Temperature 36.5 C (97.7 F)   SpO2 99 %   Weight 61.2 kg (134 lb 14.7 oz)   Height      Physical Exam  Constitutional:       General: She is not in acute distress.     Appearance: She is not ill-appearing.   HENT:      Head: Normocephalic and atraumatic.      Nose: Nose normal.      Mouth/Throat:      Mouth: Mucous membranes are moist.      Pharynx: Oropharynx is clear.   Eyes:      Conjunctiva/sclera: Conjunctivae normal.   Cardiovascular:      Rate and Rhythm: Normal rate and regular rhythm.      Pulses: Normal pulses.   Pulmonary:      Effort: Pulmonary effort is normal.      Breath sounds: Normal breath sounds.   Abdominal:      General: Abdomen is flat.       Palpations: Abdomen is soft.      Tenderness: There is abdominal tenderness. There is no right CVA tenderness, left CVA tenderness or guarding.   Skin:     General: Skin is warm and dry.   Neurological:      General: No focal deficit present.      Mental Status: She is alert and oriented to person, place, and time. Mental status is at baseline.   Psychiatric:         Mood and Affect: Mood normal.         Behavior: Behavior normal.           Past Medical History:  Diagnosis     Past Medical History:   Diagnosis Date   . Anxiety    . Chronic nausea    . Constipation    . Depression    . Endometriosis    . Foot fracture     Right   . Gastroesophageal reflux disease 02/19/2014   . HA (headache)    .  Hashimoto's thyroiditis    . Hemorrhage of right ovary 02/19/2015   . History of anesthesia complications     takes a while to wake up   . Hypothyroidism    . Irritable bowel syndrome    . Migraine    . Obesity    . Panic attack    . 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 PONV (postoperative nausea and vomiting)    . Smoker     vapes with nicotine   . UTI (urinary tract infection)    . Vitamin D deficiency        Past Surgical History:  Past Surgical History:   Procedure Laterality Date   . Foot surgery Right    . Hx appendectomy     . Hx cholecystectomy     . Hx dental extraction     . Hx foot surgery     . Hx other  10/30/2015   . Hx other     . Hx tonsil and adenoidectomy     . Hx tonsillectomy     . Laparoscopic endometriosis fulguration     . Laparoscopic endometriosis fulguration         Family History:   Family History   Problem Relation Age of Onset   . Thyroid Disease Mother    . Hypertension (High Blood Pressure) Mother    . Migraines Mother    . No Known Problems Father    . Alopecia Sister    . COLITIS Brother        Social History     Social History     Tobacco Use   . Smoking status: Never   . Smokeless tobacco: Current   Vaping Use   . Vaping Use: Every day   .  Substances: Nicotine   . Devices: Pre-filled pod   Substance Use Topics   . Alcohol use: Yes     Comment: occasionally   . Drug use: Yes     Types: Marijuana     Comment: has medical marijuana card; smokes marijuana  2 times a day       Social History     Substance and Sexual Activity   Drug Use Yes   . Types: Marijuana    Comment: has medical marijuana card; smokes marijuana  2 times a day       [name], MD    Allergies   Allergen Reactions   . Sulfamethoxazole-Trimethoprim Hives/ Urticaria         Diagnostics:    Labs:    Results for orders placed or performed during the hospital encounter of 03/09/21 (from the past 12 hour(s))   COMPREHENSIVE METABOLIC PANEL, NON-FASTING   Result Value Ref Range    SODIUM 140 136 - 146 mmol/L    POTASSIUM 3.6 3.3 - 5.1 mmol/L    CHLORIDE 106 98 - 107 mmol/L    CO2 TOTAL 26 21 - 32 mmol/L    ANION GAP 8 6 - 15 mmol/L    BUN 8 7 - 21 mg/dL    CREATININE 0.78 (L) 0.80 - 1.60 mg/dL    ESTIMATED GFR >60 mL/min/1.44m^2    ALBUMIN 4.6 3.5 - 5.2 g/dL    CALCIUM 9.4 8.0 - 10.6 mg/dL    GLUCOSE 69 (L) 70 - 100 mg/dL    ALKALINE PHOSPHATASE 42 41 - 133 U/L    ALT (SGPT) 8 0 - 55  U/L    AST (SGOT) 10 5 - 34 U/L    BILIRUBIN TOTAL 0.6 0.2 - 1.2 mg/dL    PROTEIN TOTAL 7.0 6.4 - 8.3 g/dL    ALBUMIN/GLOBULIN RATIO 1.9 >=1.0   HCG, URINE QUALITATIVE, PREGNANCY   Result Value Ref Range    HCG URINE QUALITATIVE Negative Negative    SPECIFIC GRAVITY 1.021 1.000 - 1.030   LIPASE   Result Value Ref Range    LIPASE 35 8 - 78 U/L   CBC WITH DIFF   Result Value Ref Range    WBC 8.2 3.7 - 11.0 x10^3/uL    RBC 4.78 3.85 - 5.22 x10^6/uL    HGB 14.0 11.5 - 16.0 g/dL    HCT 99.3 57.0 - 17.7 %    MCV 86.4 78.0 - 100.0 fL    MCH 29.3 26.0 - 32.0 pg    MCHC 33.9 31.0 - 35.5 g/dL    RDW-CV 93.9 03.0 - 09.2 %    PLATELETS 247 150 - 400 x10^3/uL    MPV 11.9 8.7 - 12.5 fL    NEUTROPHIL % 60 %    LYMPHOCYTE % 33 %    MONOCYTE % 6 %    EOSINOPHIL % 1 %    BASOPHIL % 0 %    NEUTROPHIL # 4.92 1.50 - 7.70  x10^3/uL    LYMPHOCYTE # 2.68 1.00 - 4.80 x10^3/uL    MONOCYTE # 0.53 0.20 - 1.10 x10^3/uL    EOSINOPHIL # <0.10 <=0.50 x10^3/uL    BASOPHIL # <0.10 <=0.20 x10^3/uL    IMMATURE GRANULOCYTE % 0 0 - 1 %    IMMATURE GRANULOCYTE # <0.10 <0.10 x10^3/uL   URINALYSIS, MACRO/MICRO   Result Value Ref Range    COLOR Orange (A) Yellow, Straw    APPEARANCE Cloudy (A) Clear    SPECIFIC GRAVITY 1.025 >1.005 - <1.030    PH 6.0 5.0 - 8.0    PROTEIN 100 (A) Negative mg/dL    GLUCOSE Negative Negative mg/dL    KETONES Trace (A) Negative mg/dL    UROBILINOGEN 0.2 0.2 - 1.0 mg/dL    BILIRUBIN Negative Negative mg/dL    BLOOD Large (A) Negative mg/dL    NITRITE Positive (A) Negative    LEUKOCYTES Negative Negative WBCs/uL   URINALYSIS, MICROSCOPIC   Result Value Ref Range    SQUAMOUS EPITHELIAL Few (A) None /hpf    WBCS 10-20 (A) None /hpf    RBCS >100 (A) None /hpf    BACTERIA Rare (A) Negative /hpf    HYALINE CASTS 10-20 (A) None /lpf     Labs reviewed and interpreted by me.    Radiology:    CT ABDOMEN PELVIS WO IV CONTRAST   Final Result by Edi, Radresults In (11/15 1107)   Right renal edema and mild prominence of the right renal collecting system without ureterectasis.  Punctate calcification in the pelvis is demonstrated.  Ureteral calculus cannot be excluded.  Please correlate for pyelonephritis.                                 Signed by Demetrius Revel, DO              ED Course/MDM:     DDX very suspicious of ureteral stone, UTI/pyelo, also considered appendicitis - pt states has appendix, but chart lists appendectomy   Labs, urine obtained   W/u as below --- no leukocytes  in urine - a few WBC's, but not true clean catch and large blood with CT showing likely small R ureteral stone   Pt given dose of Rocephin in ED - will d/c on cefdinir   Discussed strong possibility of stone, supportive measures and f/u with PCP/Uro - she actually has scheduled Uro f/u in 2 days      ED Course as of 03/09/21 [place] Mar 09, 2021   0921  PREGNANCY, URINE: Negative   0921 WBC: 8.2   0959 BLOOD(!): Large   0959 NITRITE(!): Positive   0959 WBC'S(!): 10-20   1116 CT ABDOMEN PELVIS WO IV CONTRAST  IMPRESSION:  Right renal edema and mild prominence of the right renal collecting system without ureterectasis.  Punctate calcification in the pelvis is demonstrated.  Ureteral calculus cannot be excluded.  Please correlate for pyelonephritis.   [place] in to discuss results and suspected small R ureteral stone with pt/mother - she actually has Uro appt Thursday.         Medications given during ED stay include:  Medications Administered in the ED   cefTRIAXone (ROCEPHIN) 1 g in NS 100 mL IVPB minibag (1 g Intravenous New Bag/New Syringe 03/09/21 1200)   ketorolac (TORADOL) 30 mg/mL injection (30 mg Intravenous Given 03/09/21 1045)   ondansetron (ZOFRAN) 2 mg/mL injection (4 mg Intravenous Given 03/09/21 1045)             Clinical Impression:   Encounter Diagnosis   Name Primary?   . Ureteral stone Yes       Disposition: Discharged      Prescriptions:   Current Discharge Medication List      START taking these medications    Details   cefdinir (OMNICEF) 125 mg/5 mL Oral Suspension for Reconstitution Take 12 mL (300 mg total) by mouth Twice daily for 10 days  Qty: 240 mL, Refills: 0             Follow Up: [name], MD  [place] [address]  [phone]    In 3 days      Ladona Mow, MD  [address]  [place] [address]  [phone]    In 1 week            // Morene Antu, PA-C 03/09/2021 09:17  [facility]      *Parts of this patients chart were completed in a retrospective fashion due to simultaneous direct patient care activities in the Emergency Department.   *This note was partially generated using MModal Fluency Direct system, and there may be some incorrect words, spellings, and punctuation that were not noted in checking the note before saving.    I did not have a  face-to-face encounter with this patient nor did I perform a history of physical examination.  I was the attending in the emergency department at the time of this evaluation and was available for consultation as needed by the midlevel practitioner.    Derrek Monaco, MD  03/09/2021, 13:13

## 2021-03-09 NOTE — ED Triage Notes (Signed)
RT lower abd pain started yesterday, this morning "felt a pop"

## 2021-03-11 ENCOUNTER — Encounter (INDEPENDENT_AMBULATORY_CARE_PROVIDER_SITE_OTHER): Payer: Self-pay | Admitting: Physician Assistant

## 2021-03-11 ENCOUNTER — Ambulatory Visit: Payer: No Typology Code available for payment source | Attending: Physician Assistant | Admitting: Physician Assistant

## 2021-03-11 ENCOUNTER — Other Ambulatory Visit: Payer: Self-pay

## 2021-03-11 DIAGNOSIS — N10 Acute tubulo-interstitial nephritis: Secondary | ICD-10-CM

## 2021-03-11 DIAGNOSIS — N201 Calculus of ureter: Secondary | ICD-10-CM

## 2021-03-11 MED ORDER — NITROFURANTOIN MACROCRYSTAL 50 MG CAPSULE
50.0000 mg | ORAL_CAPSULE | Freq: Every evening | ORAL | 3 refills | Status: AC
Start: 2021-03-11 — End: 2021-06-09

## 2021-03-11 MED ORDER — TAMSULOSIN 0.4 MG CAPSULE
0.4000 mg | ORAL_CAPSULE | Freq: Every evening | ORAL | 0 refills | Status: DC
Start: 2021-03-11 — End: 2022-03-02

## 2021-03-11 NOTE — Progress Notes (Signed)
[facility] MEDICINE  [facility] - FOLLOWUP    PATIENT NAME :Traci [name]   DATE OF SERVICE: 03/11/2021  SERVICE LOCATION: [facility]      Traci [name] is a 20 y.o. female who presents for followup of chronic UTIs. Patient has a history of recurrent UTIs in which she was previously treated by her gynecologist. Her CT scan from 04/2020 was unremarkable for urinary tract pathology. She is on daily cranberry/d-mannose for UTI prevention.    She underwent cystoscopy in 09/2020 which was negative for intraluminal pathology. She was started on macrobid ppx.     Patient reports that Monday night she developed right side pain.  She reports she went to bed and woke up in excruiating pain in the morning on the right that prompted her to go to ER. She was passing blood in her urine as well. She feels like she is not urinating as much. She denies gross hematuria and dysuria today. She admits to urgency and leaking. She is having right sided flank pain. Denies fever and chills. She has chronic nausea and vomiting.     Patient had a CT scan performed at the ER remarkable for right renal edema and mild prominence of the right renal collecting system without ureterectasis.  A punctate calcification was noted in the renal pelvis.  A ureteral calculus cannot be excluded.  Urine testing from ER remarkable for a large amount of blood, 10-20 white blood cells and rare bacteria although nitrite positive.  She was started on cefdinir and continues on Macrobid daily.       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 after surgery    PONV (postoperative nausea and vomiting)     Smoker     vapes with nicotine    UTI (urinary tract infection)     Vitamin D deficiency            Past Surgical History  Past Surgical History:   Procedure Laterality Date    FOOT SURGERY Right     HX APPENDECTOMY      HX CHOLECYSTECTOMY      HX DENTAL EXTRACTION      HX FOOT SURGERY      HX OTHER  10/30/2015    DIAG LAPARO SEPARATE PROC - fulgration of endometriotic implants    HX OTHER      ORIF Right foot    HX TONSIL AND ADENOIDECTOMY      HX TONSILLECTOMY      LAPAROSCOPIC ENDOMETRIOSIS FULGURATION      LAPAROSCOPIC ENDOMETRIOSIS FULGURATION      2nd surgery was to also drain a blood clot.           Allergies  Allergies  Allergen Reactions    Sulfamethoxazole-Trimethoprim Hives/ Urticaria       Medications  busPIRone (BUSPAR) 7.5 mg Oral Tablet, Take 1 Tablet (7.5 mg total) by mouth Twice daily  cefdinir (OMNICEF) 125 mg/5 mL Oral Suspension for Reconstitution, Take 12 mL (300 mg total) by mouth Twice daily for 10 days  cefdinir (OMNICEF) 250 mg/5 mL Oral Suspension for Reconstitution, TAKE 6 ML (300 MG) BY MOUTH TWO TIMES A DAY FOR 10 DAYS  Cholecalciferol, Vitamin D3, (VITAMIN D-3) 50 mcg (2,000 unit) Oral Capsule, Take 1 Capsule by mouth Once a day  cyanocobalamin (VITAMIN B12) 1,000 mcg/mL Injection Solution, Inject 1 mL (1,000 mcg total) under the skin Every 30 days  medroxyPROGESTERone (DEPO-PROVERA) 150 mg/mL IntraMUSCULAR Suspension, INJECT 1 ML (150 MG TOTAL) INTRAMUSCULARLY EVERY 3 MONTHS  mirtazapine (REMERON) 15 mg Oral Tablet, TAKE ONE TABLET BY MOUTH nightly for 30 days  pantoprazole (PROTONIX) 40 mg Oral Tablet, Delayed Release (E.C.), Take 40 mg by mouth Once a day  promethazine (PHENERGAN) 25 mg Oral Tablet, TAKE ONE TABLET BY MOUTH EVERY 8 HOURS FOR 30 DAYS  promethazine-dextromethorphan (PHENERGAN-DM) 6.25-15 mg/5 mL Oral Syrup, take 64ml by mouth  every 4 hours as needed for cough  Syringe with Needle, Disp, (MONOJECT 3CC SYR 25GX1") 3 mL 25 gauge x 1" Syringe, Use once a month  topiramate (TOPAMAX) 50 mg Oral Tablet, Take 50 mg by mouth Every night  nitrofurantoin macrocrystaL (MACRODANTIN) 50 mg Oral Capsule, Take 1 Capsule (50 mg total) by mouth Every night for 90 days Keep 03/11/21 appointment for further refills    No facility-administered medications prior to visit.          OBJECTIVE:  There were no vitals taken for this visit.      General: No apparent distress, well appearing  HEENT:  NCAT, EOMI, OP clear  Neck:  supple, trachea midline  RESP:  Nonlabored breathing, no use of accessory muscles   CV: No extremity swelling  Neuro:  Neurological exam is consistent with patients age.     GI: The abdomen is soft, nontender and nondisdended.  Right CVA tenderness  Psych: alert, appropriate mood, and in no acute distress.      MSK: Ambulates without assistance      Office Testing/Procedures:  There are no exam notes on file for this visit.       DIAGNOSTIC STUDIES:  Recent Results (from the past [id number] hour(s))   CT ABDOMEN PELVIS WO IV CONTRAST    Collection Time: 03/09/21 10:11 AM    Narrative    INDICATION:  Right flank pain and hematuria.    TECHNIQUE:  CT of the abdomen and pelvis was performed without intravenous contrast.      Automated mA/kV exposure control was utilized and patient examination was performed in strict accordance with principles of ALARA.    RADIATION AMOUNT:  271.30 mGy-cm.    COMPARISON:  US pelvis 12/03/2020, abdomen radiographs 09/25/2020, CT abdomen pelvis 04/26/2020.    FINDINGS:   Abdomen:  The lung bases are clear.  Gallbladder is surgically absent.  The liver, spleen, pancreas, adrenal glands, and left kidney are suboptimally evaluated on these unenhanced images, but demonstrate no acute pathology.  Right kidney demonstrates renal edema and minimal prominence of the right renal collecting system without significant  ureterectasis.  Punctate calculus (image 76 axial images) is demonstrated.  Nonobstructive calculus cannot be excluded.    There is no free air or lymph node enlargement.  Abdominal aorta is not aneurysmal.  Pelvis:  There is no bowel wall thickening or obstruction.  There is no free fluid.  Lymph nodes are not enlarged.  Urinary bladder is unremarkable.  Appendix is normal.    Skeleton:  There are no acute fractures.  No suspicious bony lesions.      Impression    Right renal edema and mild prominence of the right renal collecting system without ureterectasis.  Punctate calcification in the pelvis is demonstrated.  Ureteral calculus cannot be excluded.  Please correlate for pyelonephritis.                      Signed by Maretta Bees, DO       I personally reviewed the following:    Labs -urinalysis, culture  Imaging - CT  Records - patient previous records      ASSESSMENT:   Encounter Diagnoses   Name Primary?    Acute pyelonephritis Yes    Ureteral stone          PLAN:  20 year old female with history of chronic cystitis with likely acute pyelonephritis with possible right ureteral stone    Urine culture remarkable for 20,000 gram positive cocci, ER to change antibiotic once susceptibilities available  CT as noted above, no stone visualized on CT scan, will plan for renal ultrasound and KUB to evaluate for passage of stone  Rx Flomax for possible trial passage, side effects reviewed  Patient to continue Macrobid daily as well as cefdinir, recommended increased fluids  Reviewed with patient signs symptoms that prompt trip to the emergency room or sooner appointment  Medication management:  Macrobid and tamsulosin  All questions and concerns were addressed during this visit.     Orders Placed This Encounter    US KIDNEY    XR ABDOMEN WITH ADDITIONAL OBLIQUE AND/OR CONE DOWN VIEWS    tamsulosin (FLOMAX) 0.4 mg Oral Capsule    nitrofurantoin macrocrystaL (MACRODANTIN) 50 mg Oral Capsule     Return in about 2 weeks  (around 03/25/2021) for In Person Visit.      Alfredo Martinez, PA-C  [facility]         I reviewed the clinical findings and results as above, and agree with the recommendations as outlined by the Advanced Practice Provider.  Ladona Mow, MD

## 2021-03-12 ENCOUNTER — Ambulatory Visit: Payer: No Typology Code available for payment source | Attending: Physician Assistant

## 2021-03-12 DIAGNOSIS — N201 Calculus of ureter: Secondary | ICD-10-CM | POA: Insufficient documentation

## 2021-03-12 LAB — URINE CULTURE,ROUTINE
URINE CULTURE: 20000 — AB
URINE CULTURE: 20000 — AB

## 2021-03-16 ENCOUNTER — Telehealth (INDEPENDENT_AMBULATORY_CARE_PROVIDER_SITE_OTHER): Payer: Self-pay | Admitting: Physician Assistant

## 2021-03-16 ENCOUNTER — Encounter (INDEPENDENT_AMBULATORY_CARE_PROVIDER_SITE_OTHER): Payer: Self-pay | Admitting: Physician Assistant

## 2021-03-16 ENCOUNTER — Other Ambulatory Visit (INDEPENDENT_AMBULATORY_CARE_PROVIDER_SITE_OTHER): Payer: Self-pay | Admitting: Physician Assistant

## 2021-03-16 DIAGNOSIS — N10 Acute tubulo-interstitial nephritis: Secondary | ICD-10-CM

## 2021-03-16 MED ORDER — CIPROFLOXACIN 500 MG/5 ML ORAL SUSPENSION
500.0000 mg | Freq: Two times a day (BID) | ORAL | 0 refills | Status: AC
Start: 2021-03-16 — End: 2021-03-26

## 2021-03-16 NOTE — Telephone Encounter (Signed)
-----   Message from Doreene Nest sent at 03/16/2021  9:12 AM EST -----  Regarding: FW: Bladder issues   Contact: [phone]    ----- Message -----  From: [name]  Sent: 03/16/2021   8:19 AM EST  To: Urology [facility] Clinical Support  Subject: Bladder issues                                   [name] ,      I had an appointment last Thursday and we had talked about my kidney stone and infection. [facility] has not called regarding my urine culture as well . Since Saturday  I have had this severe pressure in the middle of my abdomen ( where my bladder is ) and in my both my kidneys . It feels as if I pulled a muscle ( I know its not that ) but it hurts even worse to put pressure on it , and Im also bloated right there . I only have 3 days left on my antibiotic.  Im still not urinating as much as I am use to. Im not trying to go back to emergency room due to them leaving a diagnosis out , is there anything I can do at home to relieve my pain ? Lavenia Atlas been doing ibuprofen as much as it says on the bottle .     Thank you,  Traci [name]

## 2021-03-16 NOTE — Telephone Encounter (Signed)
Please have pt repeat ua/cx now. Rx sent for liquid Cipro to pharmacy. Pt to take twice daily for 10 days, will change based on culture if needed. Please have pt hold macrobid ppx and discontinue cefdinir.    If patient has fever >100.4, chills, nausea or vomiting, recommend proceeding to ER.

## 2021-03-16 NOTE — Telephone Encounter (Signed)
Pt is aware.  

## 2021-03-17 NOTE — Telephone Encounter (Signed)
Please have pt discontinue Flomax if taking due to sulfa allergy.

## 2021-03-17 NOTE — Telephone Encounter (Signed)
Called and Left detailed message.

## 2021-03-24 ENCOUNTER — Encounter (INDEPENDENT_AMBULATORY_CARE_PROVIDER_SITE_OTHER): Payer: Self-pay | Admitting: Physician Assistant

## 2021-03-24 ENCOUNTER — Ambulatory Visit: Payer: No Typology Code available for payment source | Attending: Physician Assistant | Admitting: Physician Assistant

## 2021-03-24 ENCOUNTER — Other Ambulatory Visit: Payer: Self-pay

## 2021-03-24 VITALS — Resp 16 | Ht 62.0 in | Wt 130.0 lb

## 2021-03-24 DIAGNOSIS — N390 Urinary tract infection, site not specified: Secondary | ICD-10-CM

## 2021-03-24 DIAGNOSIS — N200 Calculus of kidney: Secondary | ICD-10-CM

## 2021-03-24 DIAGNOSIS — N3000 Acute cystitis without hematuria: Secondary | ICD-10-CM

## 2021-03-24 MED ORDER — PHENAZOPYRIDINE 10 MG/ML ORAL LIQUID
ORAL | 0 refills | Status: DC
Start: 2021-03-24 — End: 2022-03-02

## 2021-03-24 NOTE — Progress Notes (Signed)
[place]    PATIENT NAME :Traci [name] OF SERVICE: 03/24/2021  SERVICE LOCATION: [facility]      Traci [name] is a 20 y.o. female who presents for followup of chronic UTIs. Patient has a history of recurrent UTIs in which she was previously treated by her gynecologist. Her CT scan from 04/2020 was unremarkable for urinary tract pathology. She is on daily cranberry/d-mannose for UTI prevention.    She underwent cystoscopy in 09/2020 which was negative for intraluminal pathology. She was started on macrobid ppx.     Patient was recently seen in the ER for right sided pain. Patient had a CT scan performed at the ER remarkable for right renal edema and mild prominence of the right renal collecting system without ureterectasis.  A punctate calcification was noted in the renal pelvis.  A ureteral calculus cannot be excluded.  Urine testing from ER remarkable for a large amount of blood, 10-20 white blood cells and rare bacteria although nitrite positive. Culture was positive for 20K staph haemolyticus and 20K staph epidermidis. She was still having symptoms with cefidinr which was changed to Cipro approximately one week ago.    Patient states that she is doing better. She denies fever, chills, nausea and vomiting. She denies flank pain. She states that she has mild sp pressure that is improving. She denies dysuria and trouble urinating. She reports 3 days left of antibiotic.         Past Medical History  Past Medical History:   Diagnosis Date    Anxiety     Chronic nausea     Constipation     Depression     Endometriosis     Foot fracture     Right    Gastroesophageal reflux disease 02/19/2014    HA (headache)     Hashimoto's thyroiditis     Hemorrhage of right ovary 02/19/2015    History of anesthesia complications     takes a while to wake up    Hypothyroidism     Irritable bowel syndrome     Migraine     Obesity     Panic attack     Personal history  of venous thrombosis and embolism     in stomach after surgery    PONV (postoperative nausea and vomiting)     Smoker     vapes with nicotine    UTI (urinary tract infection)     Vitamin D deficiency            Past Surgical History  Past Surgical History:   Procedure Laterality Date    FOOT SURGERY Right     HX APPENDECTOMY      HX CHOLECYSTECTOMY      HX DENTAL EXTRACTION      HX FOOT SURGERY      HX OTHER  10/30/2015    DIAG LAPARO SEPARATE PROC - fulgration of endometriotic implants    HX OTHER      ORIF Right foot    HX TONSIL AND ADENOIDECTOMY      HX TONSILLECTOMY      LAPAROSCOPIC ENDOMETRIOSIS FULGURATION      LAPAROSCOPIC ENDOMETRIOSIS FULGURATION      2nd surgery was to also drain a blood clot.           Allergies  Allergies   Allergen Reactions    Sulfamethoxazole-Trimethoprim Hives/ Urticaria  Medications  busPIRone (BUSPAR) 7.5 mg Oral Tablet, Take 1 Tablet (7.5 mg total) by mouth Twice daily  cefdinir (OMNICEF) 250 mg/5 mL Oral Suspension for Reconstitution, TAKE 6 ML (300 MG) BY MOUTH TWO TIMES A DAY FOR 10 DAYS  Cholecalciferol, Vitamin D3, (VITAMIN D-3) 50 mcg (2,000 unit) Oral Capsule, Take 1 Capsule by mouth Once a day  ciprofloxacin (CIPRO) 500 mg/5 mL Oral Suspension, Microcapsule Recon, Take 5 mL (500 mg total) by mouth Twice daily for 10 days  cyanocobalamin (VITAMIN B12) 1,000 mcg/mL Injection Solution, Inject 1 mL (1,000 mcg total) under the skin Every 30 days  medroxyPROGESTERone (DEPO-PROVERA) 150 mg/mL IntraMUSCULAR Suspension, INJECT 1 ML (150 MG TOTAL) INTRAMUSCULARLY EVERY 3 MONTHS  mirtazapine (REMERON) 15 mg Oral Tablet, TAKE ONE TABLET BY MOUTH nightly for 30 days  MOTEGRITY 2 mg Oral Tablet,   nitrofurantoin macrocrystaL (MACRODANTIN) 50 mg Oral Capsule, Take 1 Capsule (50 mg total) by mouth Every night for 90 days Keep 03/11/21 appointment for further refills  pantoprazole (PROTONIX) 40 mg Oral Tablet, Delayed Release (E.C.), Take 40 mg by mouth Once a day  promethazine  (PHENERGAN) 25 mg Oral Tablet, TAKE ONE TABLET BY MOUTH EVERY 8 HOURS FOR 30 DAYS  promethazine-dextromethorphan (PHENERGAN-DM) 6.25-15 mg/5 mL Oral Syrup, take 22ml by mouth every 4 hours as needed for cough  Syringe with Needle, Disp, (MONOJECT 3CC SYR 25GX1") 3 mL 25 gauge x 1" Syringe, Use once a month  tamsulosin (FLOMAX) 0.4 mg Oral Capsule, Take 1 Capsule (0.4 mg total) by mouth Every evening after dinner  topiramate (TOPAMAX) 50 mg Oral Tablet, Take 50 mg by mouth Every night    No facility-administered medications prior to visit.          OBJECTIVE:  Resp 16   Ht 1.575 m (5\' 2" )   Wt 59 kg (130 lb)   BMI 23.78 kg/m       General: No apparent distress, well appearing  HEENT:  NCAT, EOMI, OP clear  Neck:  supple, trachea midline  RESP:  Nonlabored breathing, no use of accessory muscles   CV: No extremity swelling  Neuro:  Neurological exam is consistent with patients age.     GI: The abdomen is soft, nontender and nondisdended.   Psych: alert, appropriate mood, and in no acute distress.      MSK: Ambulates without assistance      Office Testing/Procedures:  Nursing Notes:   Dionne Bucy, [place]  03/24/21 1116  Signed     03/24/21 1100   Urine test  (Siemens Multistix 10 SG)   Time collected 1116   Color (Ref Range: Yellow) Yellow   Clarity (Ref Range: Clear) Clear   Glucose (Ref Range: Negative mg/dL) Negative   Bilirubin (Ref Range: Negative mg/dL) Negative   Ketones (Ref Range: Negative mg/dL) Negative   Urine Specific Gravity (Ref Range: 1.005 - 1.030) 1.000   Blood (urine) (Ref Range: Negative mg/dL) Negative   pH (Ref Range: 5.0 - 8.0) 5.0   Protein (Ref Range: Negative mg/dL) Negative   Urobilinogen (Ref Range: Negative mg/dL) Normal    Nitrite (Ref Range: Negative) Negative   Leukocytes (Ref Range: Negative WBC's/uL) Negative          DIAGNOSTIC STUDIES:  Recent Results (from the past [id number] hour(s))   US KIDNEY    Collection Time: 03/12/21 11:13 AM    Narrative    INDICATION:  Chronic urinary  tract infection. Hematuria.    Additional History: Ureteral stone. Smoker.  TECHNIQUE:  Ultrasound of the Kidneys.    COMPARISON:  US Renal 07/01/2014, CT A/P 03/09/2021.    FINDINGS:   The right kidney measures 10.4 cm in length.  Renal cortical echotexture is normal.  There is no hydronephrosis.  There are no stones.  There are no cysts.    The left kidney measures 10.1 cm in length.  Renal cortical echotexture is normal.  There is no hydronephrosis.  There is a single stone measuring 0.2 cm within the midpole.  There are no cysts.      Impression    1. Kidneys symmetric in size and contour, no kidney cortex mass.  2. Left kidney, midpole nonobstructing calyceal stone.  3. Kidneys with no hydronephrosis.      Signed by Juanetta Beets, MD       I personally reviewed the following:    Labs -urinalysis, culture  Imaging - RUS  Records - patient previous records      ASSESSMENT:   Encounter Diagnoses   Name Primary?    Recurrent UTI Yes    Acute cystitis          PLAN:  20 year old female with history of chronic cystitis with likely acute pyelonephritis and kidney stone    RUS remarkable for small stone, will monitor  Pt to continue Cipro until complete, ua/cx for toc  Rx pyridium for sp discomfort, se's reviewed  Pt to restart macrobid ppx once abx completed  Reviewed with patient signs symptoms that prompt trip to the emergency room or sooner appointment  Medication management:  Macrobid and tamsulosin  All questions and concerns were addressed during this visit.     Orders Placed This Encounter    URINE CULTURE    URINALYSIS, MACROSCOPIC AND MICROSCOPIC    POCT URINE DIPSTICK    phenazopyridine (PYRIDIUM) 10 mg/mL Oral Liquid     Return in about 3 months (around 06/22/2021) for In Person Visit.      Haig Prophet, PA-C  [facility]         I reviewed the clinical findings and results as above, and agree with the recommendations as outlined by the Advanced Practice Provider.  Nolon Nations, MD

## 2021-03-24 NOTE — Nursing Note (Signed)
03/24/21 1100   Urine test  (Siemens Multistix 10 SG)   Time collected 1116   Color (Ref Range: Yellow) Yellow   Clarity (Ref Range: Clear) Clear   Glucose (Ref Range: Negative mg/dL) Negative   Bilirubin (Ref Range: Negative mg/dL) Negative   Ketones (Ref Range: Negative mg/dL) Negative   Urine Specific Gravity (Ref Range: 1.005 - 1.030) 1.000   Blood (urine) (Ref Range: Negative mg/dL) Negative   pH (Ref Range: 5.0 - 8.0) 5.0   Protein (Ref Range: Negative mg/dL) Negative   Urobilinogen (Ref Range: Negative mg/dL) Normal    Nitrite (Ref Range: Negative) Negative   Leukocytes (Ref Range: Negative WBC's/uL) Negative

## 2021-03-26 ENCOUNTER — Telehealth (INDEPENDENT_AMBULATORY_CARE_PROVIDER_SITE_OTHER): Payer: Self-pay | Admitting: Physician Assistant

## 2021-03-26 NOTE — Telephone Encounter (Signed)
Please inform pt. She can take OTC Azo.

## 2021-03-26 NOTE — Telephone Encounter (Signed)
Per pharmacy fax:    Phenazopyridine is NOT available in liquid form    Please advise

## 2021-03-26 NOTE — Telephone Encounter (Signed)
LM of Traci [name]'s response to RX/pt instructed to call back for any questions or concerns

## 2021-04-23 ENCOUNTER — Encounter (INDEPENDENT_AMBULATORY_CARE_PROVIDER_SITE_OTHER): Payer: Self-pay | Admitting: NURSE PRACTITIONER

## 2021-04-27 ENCOUNTER — Encounter (INDEPENDENT_AMBULATORY_CARE_PROVIDER_SITE_OTHER): Payer: No Typology Code available for payment source | Admitting: NURSE PRACTITIONER

## 2021-04-29 ENCOUNTER — Telehealth (INDEPENDENT_AMBULATORY_CARE_PROVIDER_SITE_OTHER): Payer: Self-pay | Admitting: Physician Assistant

## 2021-04-29 DIAGNOSIS — N390 Urinary tract infection, site not specified: Secondary | ICD-10-CM

## 2021-04-29 NOTE — Telephone Encounter (Signed)
PT PHONED IN STATING She IS HAVING SYMPTOMS AGAIN - BACK PAIN AND PRESSURE - She SAID KIDNEY STONE MAYBE - PLEASE CALL

## 2021-04-29 NOTE — Telephone Encounter (Signed)
Spoke to pt, having bladder and back pain. Advised to get ua cs done today

## 2021-04-30 ENCOUNTER — Encounter (INDEPENDENT_AMBULATORY_CARE_PROVIDER_SITE_OTHER): Payer: No Typology Code available for payment source | Admitting: NURSE PRACTITIONER

## 2021-05-21 ENCOUNTER — Encounter (INDEPENDENT_AMBULATORY_CARE_PROVIDER_SITE_OTHER): Payer: MEDICAID | Admitting: NURSE PRACTITIONER

## 2021-05-31 ENCOUNTER — Other Ambulatory Visit: Payer: Self-pay

## 2021-05-31 ENCOUNTER — Inpatient Hospital Stay (HOSPITAL_COMMUNITY)
Admission: RE | Admit: 2021-05-31 | Discharge: 2021-05-31 | Disposition: A | Discharge status: Home / Self Care | Payer: No Typology Code available for payment source | Source: Ambulatory Visit

## 2021-05-31 ENCOUNTER — Other Ambulatory Visit: Payer: No Typology Code available for payment source | Attending: Family

## 2021-05-31 ENCOUNTER — Telehealth (INDEPENDENT_AMBULATORY_CARE_PROVIDER_SITE_OTHER): Payer: Self-pay | Admitting: Family

## 2021-05-31 ENCOUNTER — Telehealth (INDEPENDENT_AMBULATORY_CARE_PROVIDER_SITE_OTHER): Payer: MEDICAID | Admitting: Family

## 2021-05-31 ENCOUNTER — Ambulatory Visit (INDEPENDENT_AMBULATORY_CARE_PROVIDER_SITE_OTHER): Payer: Self-pay | Admitting: Family Medicine

## 2021-05-31 DIAGNOSIS — Z20822 Contact with and (suspected) exposure to covid-19: Secondary | ICD-10-CM | POA: Insufficient documentation

## 2021-05-31 DIAGNOSIS — R059 Cough, unspecified: Secondary | ICD-10-CM

## 2021-05-31 LAB — RAPID INFLUENZA A/B ANTIGEN
INFLUENZA A ANTIGEN: NEGATIVE
INFLUENZA B ANTIGEN: NEGATIVE

## 2021-05-31 MED ORDER — BENZONATATE 200 MG CAPSULE
200.0000 mg | ORAL_CAPSULE | Freq: Three times a day (TID) | ORAL | 1 refills | Status: DC | PRN
Start: 2021-05-31 — End: 2021-06-04

## 2021-05-31 NOTE — Progress Notes (Signed)
PRIMARY CARE, [place]  [place] [address]  Phone: [phone]  Fax: [phone]   PRIMARY CARE, [place]  [place] [address]    Video Visit     Name: Traci [name]  MRN: [id number]    Date: 05/31/2021  Age: 21 y.o.                            Patient's location: [place] [address]   Patient/family aware of provider location: Yes  Patient/family consent for video visit: Yes  Interview and observation performed by: Baird Cancer, CRNP        Encounter Date: 05/31/2021    Patient ID:  Traci [name]  [id number]    DOB: 08/01/2000  Age: 21 y.o. female    Subjective:acute with cough.    HPI   The patient is doing a video visit today for c/o a cough. Reports symptoms have been present for about 3 days.  Fevers T-Max of 101.  Reports that her friend tested positive for covid last Thursday.  Reports sob but no wheezing.  Bringing up green sputum.  Denies any nausea or vomiting.  Taking OTC medicine without relief.    Current Outpatient Medications   Medication Sig   . busPIRone (BUSPAR) 7.5 mg Oral Tablet Take 1 Tablet (7.5 mg total) by mouth Twice daily   . cefdinir (OMNICEF) 250 mg/5 mL Oral Suspension for Reconstitution TAKE 6 ML (300 MG) BY MOUTH TWO TIMES A DAY FOR 10 DAYS   . Cholecalciferol, Vitamin D3, (VITAMIN D-3) 50 mcg (2,000 unit) Oral Capsule Take 1 Capsule by mouth Once a day   . cyanocobalamin (VITAMIN B12) 1,000 mcg/mL Injection Solution Inject 1 mL (1,000 mcg total) under the skin Every 30 days   . medroxyPROGESTERone (DEPO-PROVERA) 150 mg/mL IntraMUSCULAR Suspension INJECT 1 ML (150 MG TOTAL) INTRAMUSCULARLY EVERY 3 MONTHS   . mirtazapine (REMERON) 15 mg Oral Tablet TAKE ONE TABLET BY MOUTH nightly for 30 days   . MOTEGRITY 2 mg Oral Tablet    . nitrofurantoin macrocrystaL (MACRODANTIN) 50 mg Oral Capsule Take 1 Capsule (50 mg total) by mouth Every night for 90 days Keep 03/11/21 appointment for further refills    . pantoprazole (PROTONIX) 40 mg Oral Tablet, Delayed Release (E.C.) Take 40 mg by mouth Once a day   . phenazopyridine (PYRIDIUM) 10 mg/mL Oral Liquid Please take 92mL three times daily prn UTI sx   . promethazine (PHENERGAN) 25 mg Oral Tablet TAKE ONE TABLET BY MOUTH EVERY 8 HOURS FOR 30 DAYS   . promethazine-dextromethorphan (PHENERGAN-DM) 6.25-15 mg/5 mL Oral Syrup take 59ml by mouth every 4 hours as needed for cough   . Syringe with Needle, Disp, (MONOJECT 3CC SYR 25GX1") 3 mL 25 gauge x 1" Syringe Use once a month   . tamsulosin (FLOMAX) 0.4 mg Oral Capsule Take 1 Capsule (0.4 mg total) by mouth Every evening after dinner   . topiramate (TOPAMAX) 50 mg Oral Tablet Take 50 mg by mouth Every night     Allergies   Allergen Reactions   . Sulfamethoxazole-Trimethoprim Hives/ Urticaria     Past Medical History:   Diagnosis Date   . Anxiety    . Chronic nausea    . Constipation    . Depression    . Endometriosis    . Foot fracture     Right   .  Gastroesophageal reflux disease 02/19/2014   . HA (headache)    . Hashimoto's thyroiditis    . Hemorrhage of right ovary 02/19/2015   . History of anesthesia complications     takes a while to wake up   . Hypothyroidism    . Irritable bowel syndrome    . Migraine    . Obesity    . Panic attack    . 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 PONV (postoperative nausea and vomiting)    . Smoker     vapes with nicotine   . UTI (urinary tract infection)    . Vitamin D deficiency          Past Surgical History:   Procedure Laterality Date   . FOOT SURGERY Right    . HX APPENDECTOMY     . HX CHOLECYSTECTOMY     . HX DENTAL EXTRACTION     . HX FOOT SURGERY     . HX OTHER  10/30/2015    DIAG LAPARO SEPARATE PROC - fulgration of endometriotic implants   . HX OTHER      ORIF Right foot   . HX TONSIL AND ADENOIDECTOMY     . HX TONSILLECTOMY     . LAPAROSCOPIC ENDOMETRIOSIS FULGURATION     . LAPAROSCOPIC ENDOMETRIOSIS FULGURATION      2nd surgery  was to also drain a blood clot.         Family Medical History:     Problem Relation (Age of Onset)    Alopecia Sister    COLITIS Brother    Hypertension (High Blood Pressure) Mother    Migraines Mother    No Known Problems Father    Thyroid Disease Mother          Social History     Tobacco Use   . Smoking status: Never   . Smokeless tobacco: Never   Vaping Use   . Vaping Use: Every day   . Substances: Nicotine   . Devices: Pre-filled pod   Substance Use Topics   . Alcohol use: Yes     Comment: occasionally   . Drug use: Yes     Types: Marijuana     Comment: has medical marijuana card; smokes marijuana  2 times a day       Review of Systems   Constitutional: Positive for fatigue and fever.   HENT: Positive for congestion, sinus pressure and sinus pain. Negative for ear pain.    Respiratory: Positive for cough, shortness of breath and wheezing.    Cardiovascular: Negative for chest pain and palpitations.   Gastrointestinal: Negative for nausea and vomiting.   Neurological: Negative for dizziness and headaches.     Objective:   Vitals: There were no vitals taken for this visit.        Physical Exam  Constitutional:       Appearance: She is ill-appearing.   Pulmonary:      Comments: No respiratory distress noted    Neurological:      Mental Status: She is alert and oriented to person, place, and time.   Psychiatric:         Attention and Perception: Attention and perception normal.         Assessment & Plan:   (R05.9) Cough, unspecified type  (primary encounter diagnosis)  Plan: COVID-19 SCREENING - OUTPATIENT AND DRIVE UP  TESTING, Rapid Influenza A/B Antigen, XR CHEST         AP AND LATERAL [IMG813]        covid and flu testing as ordered Stat CXR as ordered.     Orders Placed This Encounter   . Rapid Influenza A/B Antigen   . XR CHEST AP AND LATERAL [id number]   . COVID-19 SCREENING - OUTPATIENT AND DRIVE UP TESTING   . Benzonatate (TESSALON) 200 mg Oral Capsule     The patient was instructed to call office or  go to the ER with any new or worsening symptoms.        Baird Cancer, CRNP

## 2021-05-31 NOTE — Telephone Encounter (Signed)
Tried to cal pt vm is full

## 2021-05-31 NOTE — Telephone Encounter (Signed)
CXR was negative.  Illness likely viral in nature continue symptomatic care.

## 2021-05-31 NOTE — Telephone Encounter (Signed)
Regarding: Talaman-Perez Pt  \\  returning missed call  ----- Message from [place] Ever sent at 05/31/2021  2:30 PM EST -----  Peter Minium, MD    Pt returning missed call regarding test results.  Please advise.    Thanks so much for all your time and assistance!  Have a GREAT day!!!  --Marchelle Folks

## 2021-06-01 ENCOUNTER — Telehealth (INDEPENDENT_AMBULATORY_CARE_PROVIDER_SITE_OTHER): Payer: Self-pay | Admitting: Family

## 2021-06-01 LAB — COVID-19 ~~LOC~~ MOLECULAR LAB TESTING: SARS-CoV-2: NOT DETECTED

## 2021-06-01 NOTE — Telephone Encounter (Signed)
Patient is aware and verbally understands.

## 2021-06-01 NOTE — Telephone Encounter (Signed)
CXR was normal.  Influenza and covid testing negative.  May return to work today.

## 2021-06-04 ENCOUNTER — Telehealth: Payer: No Typology Code available for payment source | Admitting: Internal Medicine

## 2021-06-04 DIAGNOSIS — J329 Chronic sinusitis, unspecified: Secondary | ICD-10-CM

## 2021-06-04 DIAGNOSIS — J40 Bronchitis, not specified as acute or chronic: Secondary | ICD-10-CM

## 2021-06-04 MED ORDER — AZITHROMYCIN 250 MG TABLET
ORAL_TABLET | ORAL | 0 refills | Status: DC
Start: 2021-06-04 — End: 2021-07-23

## 2021-06-04 MED ORDER — PROMETHAZINE-DM 6.25 MG-15 MG/5 ML ORAL SYRUP
5.0000 mL | ORAL_SOLUTION | Freq: Four times a day (QID) | ORAL | 0 refills | Status: DC | PRN
Start: 2021-06-04 — End: 2021-07-23

## 2021-06-04 NOTE — Progress Notes (Signed)
PRIMARY CARE, [place]  [place] [address]    Video Visit     Name: Traci [name]  MRN: [id number]    Date: 06/04/2021  Age: 21 y.o.                            Patient's location: [place] [address]   Patient/family aware of provider location: Yes  Patient/family consent for video visit: Yes  Interview and observation performed by: Lorelee New, MD    Chief Complaint: Cough    History of Present Illness:  Traci [name] is a 21 y.o. female has provided consent for telemedicine/audiovisual visit with two-way communication.    Patient presents for acute visit by way of telemedicine.  She had a prior evaluation with 1 of the other office providers earlier in the week.  Influenza and COVID testing were negative.  Chest x-ray was negative as well.  Treated with Tessalon for cough which she has not felt is helping much.  Cough seems worse and is productive at times.  She has been sick since last Friday.  She does note some nasal congestion and headache with maxillary sinus pressure.  Denies vomiting or diarrhea.  No earache or sore throat.    Past Medical History:   Diagnosis Date   . Anxiety    . Chronic nausea    . Constipation    . Depression    . Endometriosis    . Foot fracture     Right   . Gastroesophageal reflux disease 02/19/2014   . HA (headache)    . Hashimoto's thyroiditis    . Hemorrhage of right ovary 02/19/2015   . History of anesthesia complications     takes a while to wake up   . Hypothyroidism    . Irritable bowel syndrome    . Migraine    . Obesity    . Panic attack    . 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 PONV (postoperative nausea and vomiting)    . Smoker     vapes with nicotine   . UTI (urinary tract infection)    . Vitamin D deficiency      Past Surgical History:   Procedure Laterality Date   . FOOT SURGERY Right    . HX APPENDECTOMY     . HX CHOLECYSTECTOMY     . HX DENTAL EXTRACTION     . HX  FOOT SURGERY     . HX OTHER  10/30/2015    DIAG LAPARO SEPARATE PROC - fulgration of endometriotic implants   . HX OTHER      ORIF Right foot   . HX TONSIL AND ADENOIDECTOMY     . HX TONSILLECTOMY     . LAPAROSCOPIC ENDOMETRIOSIS FULGURATION     . LAPAROSCOPIC ENDOMETRIOSIS FULGURATION      2nd surgery was to also drain a blood clot.     Family Medical History:     Problem Relation (Age of Onset)    Alopecia Sister    COLITIS Brother    Hypertension (High Blood Pressure) Mother    Migraines Mother    No Known Problems Father    Thyroid Disease Mother        Social History     Tobacco Use   . Smoking status: Never   .  Smokeless tobacco: Never   Vaping Use   . Vaping Use: Every day   . Substances: Nicotine   . Devices: Pre-filled pod   Substance Use Topics   . Alcohol use: Yes     Comment: occasionally   . Drug use: Yes     Types: Marijuana     Comment: has medical marijuana card; smokes marijuana  2 times a day     Allergies   Allergen Reactions   . Sulfamethoxazole-Trimethoprim Hives/ Urticaria     Current Outpatient Medications   Medication Sig   . Benzonatate (TESSALON) 200 mg Oral Capsule Take 1 Capsule (200 mg total) by mouth Three times a day as needed for Cough   . busPIRone (BUSPAR) 7.5 mg Oral Tablet Take 1 Tablet (7.5 mg total) by mouth Twice daily   . Cholecalciferol, Vitamin D3, (VITAMIN D-3) 50 mcg (2,000 unit) Oral Capsule Take 1 Capsule by mouth Once a day   . cyanocobalamin (VITAMIN B12) 1,000 mcg/mL Injection Solution Inject 1 mL (1,000 mcg total) under the skin Every 30 days   . medroxyPROGESTERone (DEPO-PROVERA) 150 mg/mL IntraMUSCULAR Suspension INJECT 1 ML (150 MG TOTAL) INTRAMUSCULARLY EVERY 3 MONTHS   . mirtazapine (REMERON) 15 mg Oral Tablet TAKE ONE TABLET BY MOUTH nightly for 30 days   . MOTEGRITY 2 mg Oral Tablet    . nitrofurantoin macrocrystaL (MACRODANTIN) 50 mg Oral Capsule Take 1 Capsule (50 mg total) by mouth Every night for 90 days Keep 03/11/21 appointment for further refills   .  pantoprazole (PROTONIX) 40 mg Oral Tablet, Delayed Release (E.C.) Take 40 mg by mouth Once a day   . phenazopyridine (PYRIDIUM) 10 mg/mL Oral Liquid Please take 26mL three times daily prn UTI sx   . promethazine (PHENERGAN) 25 mg Oral Tablet TAKE ONE TABLET BY MOUTH EVERY 8 HOURS FOR 30 DAYS   . Syringe with Needle, Disp, (MONOJECT 3CC SYR 25GX1") 3 mL 25 gauge x 1" Syringe Use once a month   . tamsulosin (FLOMAX) 0.4 mg Oral Capsule Take 1 Capsule (0.4 mg total) by mouth Every evening after dinner   . topiramate (TOPAMAX) 50 mg Oral Tablet Take 50 mg by mouth Every night     Patient Active Problem List   Diagnosis   . Anxiety   . Endometriosis   . Hashimoto's thyroiditis   . Iron deficiency   . Lisping   . Overweight (BMI 25.0-29.9)   . Pelvic pain   . Vitamin D deficiency   . Advice given about COVID-19 virus infection   . Postoperative nausea and vomiting   . Recurrent syncope   . Palpitations   . Irregular bleeding     ROS:  Constitutional:  No fever noted.  HENT:  As noted above.  Respiratory:  Cough as above.  GI:  No vomiting or diarrhea.    Observational Exam:   Vitals:  Not completed due to video visit.  CONSTITUTIONAL:  Conversant and in no acute distress.  Ill-appearing but nontoxic.  Audible nasal congestion noted.  EYES:  No icterus.  No eye drainage or redness.  No lid-lag or proptosis.  RESPIRATORY:  Normal respiratory effort.  Intermittent cough present.  No audible wheezing.  No tachypnea.  CARDIOVASCULAR:  No swelling or cyanosis of head/neck.  MUSCULOSKELETAL:  No cyanosis.  Normal movement of head/neck and upper torso.  NEURO:  Cranial nerves II-XII grossly intact.  SKIN:  No jaundice.  PSYCH:  Alert and oriented x3.  Cordial affect.  Did  not appear anxious.  Conversant and responsive to questions.    Assessment/Plan:      ICD-10-CM    1. Sinusitis  J32.9 azithromycin (ZITHROMAX) 250 mg Oral Tablet      2. Bronchitis  J40 azithromycin (ZITHROMAX) 250 mg Oral Tablet      promethazine-dextromethorphan (PHENERGAN-DM) 6.25-15 mg/5 mL Oral Syrup        Labs completed 05/31/2021 with COVID test negative.  Influenza A/B testing negative.    Chest x-ray completed 05/31/2021 reported as no radiographic evidence of acute cardiopulmonary process.    Will treat with Z-Pak.  Will use Phenergan DM as needed for cough.  Advised to maintain adequate fluid intake and nutritional support.  Also advised to proceed to ED if she would have worsening in status, high fevers, respiratory distress or cyanosis.  Anticipated course reviewed with patient.  Follow-up as needed.    Lorelee New, MD    This dictation was transcribed using voice recognition software and may contain minor typographical errors.

## 2021-06-21 ENCOUNTER — Ambulatory Visit (INDEPENDENT_AMBULATORY_CARE_PROVIDER_SITE_OTHER): Payer: Self-pay | Admitting: Physician Assistant

## 2021-07-20 ENCOUNTER — Ambulatory Visit (INDEPENDENT_AMBULATORY_CARE_PROVIDER_SITE_OTHER): Payer: Self-pay | Admitting: Family

## 2021-07-23 ENCOUNTER — Telehealth: Payer: MEDICAID | Admitting: NURSE PRACTITIONER

## 2021-07-23 DIAGNOSIS — J40 Bronchitis, not specified as acute or chronic: Secondary | ICD-10-CM

## 2021-07-23 MED ORDER — METHYLPREDNISOLONE 4 MG TABLETS IN A DOSE PACK
ORAL_TABLET | ORAL | 0 refills | Status: DC
Start: 2021-07-23 — End: 2022-03-02

## 2021-07-23 MED ORDER — ALBUTEROL SULFATE HFA 90 MCG/ACTUATION AEROSOL INHALER
1.0000 | INHALATION_SPRAY | Freq: Four times a day (QID) | RESPIRATORY_TRACT | 0 refills | Status: DC | PRN
Start: 2021-07-23 — End: 2022-03-02

## 2021-07-23 MED ORDER — PROMETHAZINE-DM 6.25 MG-15 MG/5 ML ORAL SYRUP
5.0000 mL | ORAL_SOLUTION | Freq: Four times a day (QID) | ORAL | 0 refills | Status: DC | PRN
Start: 2021-07-23 — End: 2022-03-02

## 2021-07-23 MED ORDER — AMOXICILLIN 500 MG CAPSULE
500.0000 mg | ORAL_CAPSULE | Freq: Three times a day (TID) | ORAL | 0 refills | Status: AC
Start: 2021-07-23 — End: 2021-07-30

## 2021-07-23 NOTE — Progress Notes (Signed)
PRIMARY CARE, [facility]  [address] [place] [address]    Video Visit     Name: Traci [name]  MRN: [id number]    Date: 07/23/2021  Age: 21 y.o.                            Patient's location: [name] [address]   Patient/family aware of provider location: Yes  Patient/family consent for video visit: Yes  Interview and observation performed by: Ilsa Iha, CRNP    Chief Complaint: Sinus Problem    History of Present Illness:  Traci [name] is a 21 y.o. female who was placed on the schedule today for an acute care visit. She tells me she is having sinus and chest congestion which started on Sunday.  She has done multiple home covid test all of which have been negative.  She is currently taking Zyrtec and Nyquil.  Upper congestion starting to clear up however, symptoms have been down into her chest.  Cough has been harsh.  She is having a lot of tightness and congestion in her chest.  She is coughing.  She tells me she is having a lot of chest congestion, not bringing much up.  No fevers.      Past Medical History:  She has a past medical history of Anxiety, Chronic nausea, Constipation, Depression, Endometriosis, Foot fracture, Gastroesophageal reflux disease (02/19/2014), HA (headache), Hashimoto's thyroiditis, Hemorrhage of right ovary (02/19/2015), History of anesthesia complications, Hypothyroidism, Irritable bowel syndrome, Migraine, Obesity, Panic attack, Personal history of venous thrombosis and embolism, PONV (postoperative nausea and vomiting), Smoker, UTI (urinary tract infection), and Vitamin D deficiency.    She has no past medical history of Essential hypertension or Malignant hyperthermia.    Past Surgical History:  She has a past surgical history that includes hx tonsil and adenoidectomy; Foot surgery (Right); hx other (10/30/2015); hx other; Laparoscopic endometriosis fulguration; Laparoscopic endometriosis fulguration; hx tonsillectomy; hx dental  extraction; hx appendectomy; hx foot surgery; and hx gall bladder surgery/chole.    Problem List:  She has Anxiety; Endometriosis; Hashimoto's thyroiditis; Iron deficiency; Lisping; Overweight (BMI 25.0-29.9); Pelvic pain; Vitamin D deficiency; Advice given about COVID-19 virus infection; Postoperative nausea and vomiting; Recurrent syncope; Palpitations; and Irregular bleeding on their problem list.    Medications:  .  albuterol sulfate  .  amoxicillin  .  busPIRone  .  Cholecalciferol (Vitamin D3)  .  cyanocobalamin  .  medroxyPROGESTERone  .  Methylprednisolone  .  mirtazapine  .  Motegrity  .  pantoprazole  .  phenazopyridine  .  promethazine-dextromethorphan  .  Monoject 3cc Syr 25Gx1"  .  tamsulosin  .  topiramate     Review of Systems:  Review of Systems   Constitutional: Negative.    HENT: Positive for congestion and sinus pain.    Eyes: Negative.    Respiratory: Positive for cough.    Cardiovascular: Negative.    Gastrointestinal: Negative.    Genitourinary: Negative.    Musculoskeletal: Negative.    Skin: Negative.    Neurological: Negative.    Psychiatric/Behavioral: Negative.          Observational Exam:   During the observational exam there is no acute distress noted. respirations appear to be even and nonlabored.  I am unable to appreciate any audible wheezing during the video visit.  Her voice does sound worse during the day of visit.  She  is able to talk in full sentence without stopping to take a breath.    Assessment/Plan:  Patient's symptoms consistent with bronchitis, has harsh, barking cough, unable to bring up sputum for approximately 1 week.  Will send antibiotic, steroid, inhaler, and cough medication to the pharmacy for the patient.  Advised to take medication as prescribed.  Advised patient return to clinic no improvement or worsening of symptoms.      ICD-10-CM    1. Bronchitis  J40 promethazine-dextromethorphan (PHENERGAN-DM) 6.25-15 mg/5 mL Oral Syrup        Orders Placed This Encounter    . promethazine-dextromethorphan (PHENERGAN-DM) 6.25-15 mg/5 mL Oral Syrup   . Methylprednisolone (MEDROL DOSEPACK) 4 mg Oral Tablets, Dose Pack   . albuterol sulfate (PROVENTIL OR VENTOLIN OR PROAIR) 90 mcg/actuation Inhalation oral inhaler   . amoxicillin (AMOXIL) 500 mg Oral Capsule     Follow Up:  body routine follow-up on his schedule, return sooner if needed in the interim.    [employer], CRNP

## 2021-08-23 ENCOUNTER — Ambulatory Visit (INDEPENDENT_AMBULATORY_CARE_PROVIDER_SITE_OTHER): Payer: No Typology Code available for payment source | Admitting: NURSE PRACTITIONER

## 2021-08-31 ENCOUNTER — Telehealth (INDEPENDENT_AMBULATORY_CARE_PROVIDER_SITE_OTHER): Payer: Self-pay | Admitting: Physician Assistant

## 2021-08-31 ENCOUNTER — Other Ambulatory Visit (INDEPENDENT_AMBULATORY_CARE_PROVIDER_SITE_OTHER): Payer: Self-pay | Admitting: Physician Assistant

## 2021-08-31 DIAGNOSIS — N390 Urinary tract infection, site not specified: Secondary | ICD-10-CM

## 2021-08-31 DIAGNOSIS — Z87440 Personal history of urinary (tract) infections: Secondary | ICD-10-CM

## 2021-08-31 NOTE — Telephone Encounter (Signed)
PT SAYS She FEELS LIKE She HAS A UTI BUT HASN'T BEEN SEEN SINCE January AND She WASN'T SURE WHAT TO DO

## 2021-08-31 NOTE — Telephone Encounter (Signed)
Please check to see what symptoms patient is having. She is to complete ua/cx, order placed. Recommend increased fluids. Pt can take azo for urinary sx. If fever >100.4, chills, nausea or vomiting, pt to proceed to ER.

## 2021-08-31 NOTE — Telephone Encounter (Signed)
Spoke to pt, having burning and frequency, advised to get ua cs, appt made 05/16

## 2021-09-07 ENCOUNTER — Ambulatory Visit (INDEPENDENT_AMBULATORY_CARE_PROVIDER_SITE_OTHER): Payer: No Typology Code available for payment source | Admitting: Physician Assistant

## 2021-09-07 ENCOUNTER — Other Ambulatory Visit: Payer: Self-pay

## 2021-09-09 ENCOUNTER — Ambulatory Visit (INDEPENDENT_AMBULATORY_CARE_PROVIDER_SITE_OTHER): Payer: No Typology Code available for payment source | Admitting: NURSE PRACTITIONER

## 2021-09-09 ENCOUNTER — Ambulatory Visit (INDEPENDENT_AMBULATORY_CARE_PROVIDER_SITE_OTHER): Payer: No Typology Code available for payment source | Admitting: PHYSICIAN ASSISTANT

## 2021-09-10 ENCOUNTER — Ambulatory Visit (INDEPENDENT_AMBULATORY_CARE_PROVIDER_SITE_OTHER): Payer: No Typology Code available for payment source | Admitting: NURSE PRACTITIONER

## 2021-09-27 ENCOUNTER — Ambulatory Visit (INDEPENDENT_AMBULATORY_CARE_PROVIDER_SITE_OTHER): Payer: MEDICAID | Admitting: NURSE PRACTITIONER

## 2021-09-27 ENCOUNTER — Encounter (INDEPENDENT_AMBULATORY_CARE_PROVIDER_SITE_OTHER): Payer: Self-pay | Admitting: NURSE PRACTITIONER

## 2021-09-27 ENCOUNTER — Ambulatory Visit (INDEPENDENT_AMBULATORY_CARE_PROVIDER_SITE_OTHER): Payer: No Typology Code available for payment source | Admitting: NURSE PRACTITIONER

## 2021-09-27 ENCOUNTER — Other Ambulatory Visit: Payer: Self-pay

## 2021-09-27 VITALS — BP 112/72 | HR 74 | Temp 97.3°F | Ht 62.0 in | Wt 159.0 lb

## 2021-09-27 DIAGNOSIS — L603 Nail dystrophy: Secondary | ICD-10-CM

## 2021-09-27 DIAGNOSIS — R2232 Localized swelling, mass and lump, left upper limb: Secondary | ICD-10-CM

## 2021-09-27 DIAGNOSIS — E559 Vitamin D deficiency, unspecified: Secondary | ICD-10-CM

## 2021-09-27 DIAGNOSIS — E538 Deficiency of other specified B group vitamins: Secondary | ICD-10-CM

## 2021-09-27 DIAGNOSIS — R7989 Other specified abnormal findings of blood chemistry: Secondary | ICD-10-CM

## 2021-09-27 DIAGNOSIS — Z6829 Body mass index (BMI) 29.0-29.9, adult: Secondary | ICD-10-CM

## 2021-09-27 NOTE — Nursing Note (Signed)
Patient is here today for a swollen lymph node under her left arm. Very painful. Also states she has been losing density in her fingernails.

## 2021-09-27 NOTE — Progress Notes (Signed)
PRIMARY CARE, [facility]  [address] [place] [address]  Phone: [phone]  Fax: [phone]    Encounter Date: 09/27/2021    Patient ID:  Traci [name]  VEH:[id number]    DOB: [birth year]  Age: 21 y.o. female    Subjective:     Chief Complaint   Patient presents with   . Swollen Lymph Nodes     The patient presents today for an acute appointment.  She tells me she has a swollen tender lymph node under her left arm in her axilla region.  She says the area is painful.  She says there is a small bruise in the area where she has tenderness however, denies any injury/fall.  She 1st noticed the lump about 2 weeks ago.  Additionally, she tells me she has noticed her fingernails have been more brittle.  She does have vitamin-D and B12 deficiencies.        Current Outpatient Medications   Medication Sig   . albuterol sulfate (PROVENTIL OR VENTOLIN OR PROAIR) 90 mcg/actuation Inhalation oral inhaler Take 1-2 Puffs by inhalation Every 6 hours as needed   . busPIRone (BUSPAR) 7.5 mg Oral Tablet Take 1 Tablet (7.5 mg total) by mouth Twice daily   . Cholecalciferol, Vitamin D3, (VITAMIN D-3) 50 mcg (2,000 unit) Oral Capsule Take 1 Capsule by mouth Once a day   . medroxyPROGESTERone (DEPO-PROVERA) 150 mg/mL IntraMUSCULAR Suspension INJECT 1 ML (150 MG TOTAL) INTRAMUSCULARLY EVERY 3 MONTHS   . Methylprednisolone (MEDROL DOSEPACK) 4 mg Oral Tablets, Dose Pack Take as instructed.   . mirtazapine (REMERON) 15 mg Oral Tablet TAKE ONE TABLET BY MOUTH nightly for 30 days   . MOTEGRITY 2 mg Oral Tablet    . pantoprazole (PROTONIX) 40 mg Oral Tablet, Delayed Release (E.C.) Take 1 Tablet (40 mg total) by mouth Once a day   . phenazopyridine (PYRIDIUM) 10 mg/mL Oral Liquid Please take 70mL three times daily prn UTI sx   . promethazine-dextromethorphan (PHENERGAN-DM) 6.25-15 mg/5 mL Oral Syrup Take 5 mL by mouth Four times a day as needed for Cough   . Syringe with Needle, Disp, (MONOJECT 3CC SYR 25GX1") 3 mL 25  gauge x 1" Syringe Use once a month   . tamsulosin (FLOMAX) 0.4 mg Oral Capsule Take 1 Capsule (0.4 mg total) by mouth Every evening after dinner   . topiramate (TOPAMAX) 50 mg Oral Tablet Take 50 mg by mouth Every night     Allergies   Allergen Reactions   . Sulfamethoxazole-Trimethoprim Hives/ Urticaria     Past Medical History:   Diagnosis Date   . Anxiety    . Chronic nausea    . Constipation    . Depression    . Endometriosis    . Foot fracture     Right   . Gastroesophageal reflux disease 02/19/2014   . HA (headache)    . Hashimoto's thyroiditis    . Hemorrhage of right ovary 02/19/2015   . History of anesthesia complications     takes a while to wake up   . Hypothyroidism    . Irritable bowel syndrome    . Migraine    . Obesity    . Panic attack    . 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 PONV (postoperative nausea and vomiting)    . Smoker     vapes with nicotine   . UTI (urinary  tract infection)    . Vitamin D deficiency          Past Surgical History:   Procedure Laterality Date   . FOOT SURGERY Right    . HX APPENDECTOMY     . HX CHOLECYSTECTOMY     . HX DENTAL EXTRACTION     . HX FOOT SURGERY     . HX OTHER  10/30/2015    DIAG LAPARO SEPARATE PROC - fulgration of endometriotic implants   . HX OTHER      ORIF Right foot   . HX TONSIL AND ADENOIDECTOMY     . HX TONSILLECTOMY     . LAPAROSCOPIC ENDOMETRIOSIS FULGURATION     . LAPAROSCOPIC ENDOMETRIOSIS FULGURATION      2nd surgery was to also drain a blood clot.         Family Medical History:     Problem Relation (Age of Onset)    Alopecia Sister    COLITIS Brother    Hypertension (High Blood Pressure) Mother    Migraines Mother    No Known Problems Father    Thyroid Disease Mother          Social History     Tobacco Use   . Smoking status: Never   . Smokeless tobacco: Never   Vaping Use   . Vaping Use: Every day   . Substances: Nicotine   . Devices: Pre-filled pod   Substance Use Topics   . Alcohol use: Yes      Comment: occasionally   . Drug use: Yes     Types: Marijuana     Comment: has medical marijuana card; smokes marijuana  2 times a day       Review of Systems   Constitutional: Negative.    HENT: Negative.    Eyes: Negative.    Respiratory: Negative.    Cardiovascular: Negative.    Gastrointestinal: Negative.    Endocrine: Negative.    Genitourinary: Negative.    Musculoskeletal: Negative.    Skin:        + brittle nails.  Reports lump under left axilla.     Neurological: Negative.    Hematological: Negative.    Psychiatric/Behavioral: Negative.      Objective:   Vitals: BP 112/72   Pulse 74   Temp 36.3 C (97.3 F) (Thermal Scan)   Ht 1.575 m (5\' 2" )   Wt 72.1 kg (159 lb)   SpO2 99%   BMI 29.08 kg/m         Physical Exam  HENT:      Head: Normocephalic.      Nose: Nose normal.      Mouth/Throat:      Mouth: Mucous membranes are moist.   Eyes:      Extraocular Movements: Extraocular movements intact.      Pupils: Pupils are equal, round, and reactive to light.   Cardiovascular:      Rate and Rhythm: Normal rate and regular rhythm.      Pulses: Normal pulses.      Heart sounds: Normal heart sounds.   Pulmonary:      Effort: Pulmonary effort is normal.      Breath sounds: Normal breath sounds.   Abdominal:      Palpations: Abdomen is soft.      Tenderness: There is no abdominal tenderness.   Musculoskeletal:         General: Normal range of motion.      Cervical  back: Normal range of motion and neck supple.      Comments: There is some slight ecchymosis in the left axilla area.  Mild tenderness to palpation.  There is no redness, drainage, or open areas.   Skin:     General: Skin is warm and dry.      Capillary Refill: Capillary refill takes less than 2 seconds.   Neurological:      General: No focal deficit present.      Mental Status: She is alert and oriented to person, place, and time.   Psychiatric:         Mood and Affect: Mood normal.         Behavior: Behavior normal.         Assessment & Plan:      ENCOUNTER DIAGNOSES     ICD-10-CM   1. Lump of axilla, left  R22.32   2. Brittle nails  L60.3   3. Vitamin D deficiency  E55.9   4. Low vitamin B12 level  E53.8   5. BMI 29.0-29.9,adult  Z68.29       Orders Placed This Encounter   . [place] AXILLA(ONLY) LEFT BREAST-ADDL VIEWS/BREAST US AS REQ BY RAD   . CBC/DIFF   . IRON TRANSFERRIN AND TIBC   . FERRITIN   . VITAMIN B12   . THYROID STIMULATING HORMONE (SENSITIVE TSH)   . COMPREHENSIVE METABOLIC PNL, FASTING   . VITAMIN D, 25-HYDROXY, TOTAL, IMMUNOASSAY     The patient was advised have ultrasound and blood work completed as ordered.  Further determination to be based on results.  She was advised should symptoms worsen at all return to clinic and she verbalized understanding.    Return if symptoms worsen or fail to improve.    [employer], CRNP

## 2021-09-28 ENCOUNTER — Ambulatory Visit (INDEPENDENT_AMBULATORY_CARE_PROVIDER_SITE_OTHER): Payer: Self-pay | Admitting: Family Medicine

## 2021-09-28 NOTE — Telephone Encounter (Signed)
Regarding: [place] Pt // Work excuse request  ----- Message from [facility] sent at 09/28/2021 10:27 AM EDT -----  Peter Minium, MD    Pt calling in stating she was late getting to work due to her appointment on 06/05, so she is requesting a note stating she had a doctor's appointment that date for her employer. She states this can just be placed in her MyChart.    Thanks,  [employer]

## 2021-09-28 NOTE — Telephone Encounter (Signed)
Approved and signed.

## 2021-09-28 NOTE — Telephone Encounter (Signed)
Called patient with no answer, left voicemail advised of excuse in [place].

## 2021-09-28 NOTE — Telephone Encounter (Signed)
pended

## 2021-09-29 ENCOUNTER — Other Ambulatory Visit: Payer: Self-pay

## 2021-09-29 ENCOUNTER — Ambulatory Visit
Admission: RE | Admit: 2021-09-29 | Discharge: 2021-09-29 | Disposition: A | Discharge status: Home / Self Care | Payer: MEDICAID | Source: Ambulatory Visit | Attending: NURSE PRACTITIONER | Admitting: NURSE PRACTITIONER

## 2021-09-29 ENCOUNTER — Ambulatory Visit (INDEPENDENT_AMBULATORY_CARE_PROVIDER_SITE_OTHER): Payer: No Typology Code available for payment source | Admitting: NURSE PRACTITIONER

## 2021-09-29 ENCOUNTER — Ambulatory Visit (HOSPITAL_COMMUNITY): Payer: MEDICAID

## 2021-09-29 DIAGNOSIS — E559 Vitamin D deficiency, unspecified: Secondary | ICD-10-CM

## 2021-09-29 DIAGNOSIS — L603 Nail dystrophy: Secondary | ICD-10-CM | POA: Insufficient documentation

## 2021-09-29 DIAGNOSIS — R2232 Localized swelling, mass and lump, left upper limb: Secondary | ICD-10-CM | POA: Insufficient documentation

## 2021-09-29 DIAGNOSIS — E538 Deficiency of other specified B group vitamins: Secondary | ICD-10-CM

## 2021-09-29 LAB — IRON TRANSFERRIN AND TIBC
IRON (TRANSFERRIN) SATURATION: 18 % (ref 15–50)
IRON: 64 ug/dL (ref 45–170)
TOTAL IRON BINDING CAPACITY: 358 ug/dL (ref 252–504)
TRANSFERRIN: 256 mg/dL (ref 180–360)

## 2021-09-29 LAB — COMPREHENSIVE METABOLIC PNL, FASTING
ALBUMIN: 4.4 g/dL (ref 3.5–5.0)
ALKALINE PHOSPHATASE: 54 U/L (ref 40–110)
ALT (SGPT): 8 U/L (ref 8–22)
ANION GAP: 9 mmol/L (ref 4–13)
AST (SGOT): 14 U/L (ref 8–45)
BILIRUBIN TOTAL: 0.4 mg/dL (ref 0.3–1.3)
BUN/CREA RATIO: 10 (ref 6–22)
BUN: 8 mg/dL (ref 8–25)
CALCIUM: 9.4 mg/dL (ref 8.5–10.0)
CHLORIDE: 108 mmol/L (ref 96–111)
CO2 TOTAL: 24 mmol/L (ref 22–30)
CREATININE: 0.77 mg/dL (ref 0.60–1.05)
ESTIMATED GFR: >90 mL/min/BSA (ref >=60)
GLUCOSE: 87 mg/dL (ref 70–99)
POTASSIUM: 4.2 mmol/L (ref 3.5–5.1)
PROTEIN TOTAL: 7.6 g/dL (ref 6.4–8.3)
SODIUM: 141 mmol/L (ref 136–145)

## 2021-09-29 LAB — CBC WITH DIFF
BASOPHIL #: <0.1 10*3/uL (ref <=0.20)
BASOPHIL %: 0 %
EOSINOPHIL #: <0.1 10*3/uL (ref <=0.50)
EOSINOPHIL %: 0 %
HCT: 37.9 % (ref 34.8–46.0)
HGB: 13.4 g/dL (ref 11.5–16.0)
IMMATURE GRANULOCYTE #: <0.1 10*3/uL (ref <0.10)
IMMATURE GRANULOCYTE %: 0 % (ref 0–1)
LYMPHOCYTE #: 2.4 10*3/uL (ref 1.00–4.80)
LYMPHOCYTE %: 35 %
MCH: 29.5 pg (ref 26.0–32.0)
MCHC: 35.4 g/dL (ref 31.0–35.5)
MCV: 83.3 fL (ref 78.0–100.0)
MONOCYTE #: 0.41 10*3/uL (ref 0.20–1.10)
MONOCYTE %: 6 %
MPV: 11.2 fL (ref 8.7–12.5)
NEUTROPHIL #: 4.06 10*3/uL (ref 1.50–7.70)
NEUTROPHIL %: 59 %
PLATELETS: 268 10*3/uL (ref 150–400)
RBC: 4.55 10*6/uL (ref 3.85–5.22)
RDW-CV: 13 % (ref 11.5–15.5)
WBC: 7 10*3/uL (ref 3.7–11.0)

## 2021-09-29 LAB — FERRITIN: FERRITIN: 29 ng/mL (ref 5–200)

## 2021-09-29 LAB — THYROID STIMULATING HORMONE (SENSITIVE TSH): TSH: 2.297 u[IU]/mL (ref 0.430–3.550)

## 2021-09-29 LAB — VITAMIN B12: VITAMIN B 12: 363 pg/mL (ref 200–900)

## 2021-09-29 LAB — VITAMIN D 25 TOTAL: VITAMIN D 25, TOTAL: 20.1 ng/mL — ABNORMAL LOW (ref 30.0–100.0)

## 2021-09-30 ENCOUNTER — Telehealth (INDEPENDENT_AMBULATORY_CARE_PROVIDER_SITE_OTHER): Payer: Self-pay | Admitting: NURSE PRACTITIONER

## 2021-09-30 NOTE — Telephone Encounter (Signed)
Please advise it takes time for levels to come back up.  Continue with supplement and will monitor.  Thank you

## 2021-09-30 NOTE — Telephone Encounter (Signed)
Patient is aware and verbally understands.  Wants supp increased.

## 2021-09-30 NOTE — Telephone Encounter (Signed)
Pt aware.

## 2021-09-30 NOTE — Telephone Encounter (Signed)
Please advise patient her vitamin D level was a little low.  Please continue with supplement as prescribed.  Thank you.

## 2021-10-19 ENCOUNTER — Ambulatory Visit (INDEPENDENT_AMBULATORY_CARE_PROVIDER_SITE_OTHER): Payer: Self-pay | Admitting: NURSE PRACTITIONER

## 2021-10-20 ENCOUNTER — Ambulatory Visit (INDEPENDENT_AMBULATORY_CARE_PROVIDER_SITE_OTHER): Payer: MEDICAID | Admitting: NURSE PRACTITIONER

## 2021-10-21 ENCOUNTER — Ambulatory Visit (INDEPENDENT_AMBULATORY_CARE_PROVIDER_SITE_OTHER): Payer: MEDICAID | Admitting: NURSE PRACTITIONER

## 2021-10-22 ENCOUNTER — Ambulatory Visit (INDEPENDENT_AMBULATORY_CARE_PROVIDER_SITE_OTHER): Payer: MEDICAID | Admitting: NURSE PRACTITIONER

## 2021-11-04 ENCOUNTER — Emergency Department
Admission: EM | Admit: 2021-11-04 | Discharge: 2021-11-05 | Disposition: A | Discharge status: Home / Self Care | Payer: MEDICAID | Attending: Emergency Medicine | Admitting: Emergency Medicine

## 2021-11-04 ENCOUNTER — Encounter (HOSPITAL_COMMUNITY): Payer: Self-pay

## 2021-11-04 ENCOUNTER — Other Ambulatory Visit: Payer: Self-pay

## 2021-11-04 DIAGNOSIS — N83209 Unspecified ovarian cyst, unspecified side: Secondary | ICD-10-CM

## 2021-11-04 DIAGNOSIS — F17290 Nicotine dependence, other tobacco product, uncomplicated: Secondary | ICD-10-CM | POA: Insufficient documentation

## 2021-11-04 DIAGNOSIS — N83201 Unspecified ovarian cyst, right side: Secondary | ICD-10-CM | POA: Insufficient documentation

## 2021-11-04 DIAGNOSIS — N83202 Unspecified ovarian cyst, left side: Secondary | ICD-10-CM | POA: Insufficient documentation

## 2021-11-04 LAB — CBC WITH DIFF
BASOPHIL #: <0.1 10*3/uL (ref <=0.20)
BASOPHIL %: 1 %
EOSINOPHIL #: <0.1 10*3/uL (ref <=0.50)
EOSINOPHIL %: 1 %
HCT: 36.9 % (ref 34.8–46.0)
HGB: 12.9 g/dL (ref 11.5–16.0)
IMMATURE GRANULOCYTE #: <0.1 10*3/uL (ref <0.10)
IMMATURE GRANULOCYTE %: 1 % (ref 0–1)
LYMPHOCYTE #: 2.3 10*3/uL (ref 1.00–4.80)
LYMPHOCYTE %: 21 %
MCH: 29.9 pg (ref 26.0–32.0)
MCHC: 35 g/dL (ref 31.0–35.5)
MCV: 85.4 fL (ref 78.0–100.0)
MONOCYTE #: 0.53 10*3/uL (ref 0.20–1.10)
MONOCYTE %: 5 %
MPV: 11.5 fL (ref 8.7–12.5)
NEUTROPHIL #: 8.13 10*3/uL — ABNORMAL HIGH (ref 1.50–7.70)
NEUTROPHIL %: 71 %
PLATELETS: 240 10*3/uL (ref 150–400)
RBC: 4.32 10*6/uL (ref 3.85–5.22)
RDW-CV: 12.7 % (ref 11.5–15.5)
WBC: 11.1 10*3/uL — ABNORMAL HIGH (ref 3.7–11.0)

## 2021-11-04 LAB — URINALYSIS, MACRO/MICRO
BILIRUBIN: NEGATIVE mg/dL
BLOOD: NEGATIVE mg/dL
GLUCOSE: NEGATIVE mg/dL
KETONES: NEGATIVE mg/dL
LEUKOCYTES: NEGATIVE WBCs/uL
NITRITE: NEGATIVE
PH: 7.5 (ref 5.0–9.0)
PROTEIN: NEGATIVE mg/dL
SPECIFIC GRAVITY: 1.016 (ref 1.001–1.030)
UROBILINOGEN: 0.2 mg/dL (ref 0.2–1.0)

## 2021-11-04 MED ORDER — MORPHINE 4 MG/ML INJECTION SYRINGE
4.0000 mg | INJECTION | INTRAMUSCULAR | Status: AC
Start: 2021-11-05 — End: 2021-11-05
  Administered 2021-11-05: 4 mg via INTRAVENOUS
  Filled 2021-11-04: qty 1

## 2021-11-04 MED ORDER — IBUPROFEN 600 MG TABLET
600.0000 mg | ORAL_TABLET | ORAL | Status: AC
Start: 2021-11-04 — End: 2021-11-04
  Administered 2021-11-04: 600 mg via ORAL
  Filled 2021-11-04: qty 1

## 2021-11-04 MED ORDER — ACETAMINOPHEN 500 MG TABLET
1000.0000 mg | ORAL_TABLET | ORAL | Status: AC
Start: 2021-11-04 — End: 2021-11-04
  Administered 2021-11-04: 1000 mg via ORAL
  Filled 2021-11-04: qty 2

## 2021-11-04 MED ORDER — METOCLOPRAMIDE 5 MG/ML INJECTION SOLUTION
5.0000 mg | Freq: Once | INTRAMUSCULAR | Status: AC
Start: 2021-11-05 — End: 2021-11-05
  Administered 2021-11-05: 5 mg via INTRAVENOUS
  Filled 2021-11-04: qty 2

## 2021-11-04 MED ORDER — SODIUM CHLORIDE 0.9 % IV BOLUS
1000.0000 mL | INJECTION | Status: AC
Start: 2021-11-05 — End: 2021-11-05
  Administered 2021-11-05: 0 mL via INTRAVENOUS
  Administered 2021-11-05: 1000 mL via INTRAVENOUS

## 2021-11-04 NOTE — ED Triage Notes (Signed)
Patient presents to ED ambulatory for increased right flank pain with burning upon urination. Hx of kidneys stones.

## 2021-11-05 ENCOUNTER — Emergency Department (HOSPITAL_COMMUNITY): Payer: MEDICAID

## 2021-11-05 LAB — BASIC METABOLIC PANEL
ANION GAP: 8 mmol/L (ref 4–13)
BUN/CREA RATIO: 20 (ref 6–22)
BUN: 14 mg/dL (ref 8–25)
CALCIUM: 9.1 mg/dL (ref 8.5–10.0)
CHLORIDE: 105 mmol/L (ref 96–111)
CO2 TOTAL: 25 mmol/L (ref 22–30)
CREATININE: 0.71 mg/dL (ref 0.60–1.05)
ESTIMATED GFR: >90 mL/min/BSA (ref >=60)
GLUCOSE: 92 mg/dL (ref 65–125)
POTASSIUM: 4.1 mmol/L (ref 3.5–5.1)
SODIUM: 138 mmol/L (ref 136–145)

## 2021-11-05 LAB — GOLD TOP TUBE

## 2021-11-05 LAB — HCG, SERUM QUALITATIVE, PREGNANCY: PREGNANCY, SERUM QUALITATIVE: NEGATIVE

## 2021-11-05 LAB — BLUE TOP TUBE

## 2021-11-05 MED ORDER — HYDROCODONE 5 MG-ACETAMINOPHEN 325 MG TABLET
1.0000 | ORAL_TABLET | Freq: Four times a day (QID) | ORAL | 0 refills | Status: DC | PRN
Start: 2021-11-05 — End: 2022-03-02

## 2021-11-05 MED ORDER — IOHEXOL 300 MG IODINE/ML INTRAVENOUS SOLUTION
95.0000 mL | INTRAVENOUS | Status: AC
Start: 2021-11-05 — End: 2021-11-05
  Administered 2021-11-05: 95 mL via INTRAVENOUS

## 2021-11-05 MED ORDER — ONDANSETRON 4 MG DISINTEGRATING TABLET
4.0000 mg | ORAL_TABLET | Freq: Once | ORAL | Status: AC
Start: 2021-11-05 — End: 2021-11-05
  Administered 2021-11-05: 4 mg via ORAL
  Filled 2021-11-05: qty 1

## 2021-11-05 MED ORDER — HYDROCODONE 5 MG-ACETAMINOPHEN 325 MG TABLET
1.0000 | ORAL_TABLET | ORAL | Status: AC
Start: 2021-11-05 — End: 2021-11-05
  Administered 2021-11-05: 1 via ORAL
  Filled 2021-11-05: qty 1

## 2021-11-05 NOTE — Discharge Instructions (Signed)
There is a cyst on her ovary this is likely part of normal female menstrual cycle and should slowly improve, recommend warm heat, 600 mg generic ibuprofen every 6-8 hours.  Can use hydrocodone if needed for worse pain and sleep only.    Return to ER for any uncontrolled symptoms or worsening.

## 2021-11-05 NOTE — ED Provider Notes (Signed)
[facility]       Name: Traci [name]  Age and Gender: 21 y.o. female  PCP: Peter Minium, MD    Chief Complaint:  Chief Complaint   Patient presents with   . Flank Pain     History of Presenting Illness     Traci [name], date of birth [birth year], is a 21 y.o. female who presents with anxiety history of constipation endometriosis IBS history of ureteral stones previous appendectomy cholecystectomy endometriosis surgery, patient has had pain in her right lower back and also right side of her abdomen for the past day with some nausea.  No changes to bowel or bladder no vomiting      HPI    Review of System    Review of Systems   Constitutional: Negative for fever.   HENT: Negative for sore throat.    Eyes:        No overt vision changes   Respiratory: Negative for shortness of breath.    Cardiovascular: Negative for chest pain.   Gastrointestinal: Positive for abdominal pain and nausea. Negative for constipation and vomiting.   Genitourinary: Negative for dysuria.   Allergic/Immunologic:        Allergies have been reviewed in the EMR.   Neurological: Negative for headaches.   Psychiatric/Behavioral: Negative for confusion.        Denies overt mental health issue        Histories   Past Medical History:  Past Medical History:   Diagnosis Date   . Anxiety    . Chronic nausea    . Constipation    . Depression    . Endometriosis    . Foot fracture     Right   . Gastroesophageal reflux disease 02/19/2014   . HA (headache)    . Hashimoto's thyroiditis    . Hemorrhage of right ovary 02/19/2015   . History of anesthesia complications     takes a while to wake up   . Hypothyroidism    . Irritable bowel syndrome    . Migraine    . Obesity    . Panic attack    . 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 PONV (postoperative nausea and vomiting)    . Smoker     vapes with nicotine   . UTI (urinary tract infection)    . Vitamin D deficiency        Past Surgical History:  Past  Surgical History:   Procedure Laterality Date   . Foot surgery Right    . Hx appendectomy     . Hx cholecystectomy     . Hx dental extraction     . Hx foot surgery     . Hx other  10/30/2015   . Hx other     . Hx tonsil and adenoidectomy     . Hx tonsillectomy     . Laparoscopic endometriosis fulguration     . Laparoscopic endometriosis fulguration         Family History:   Family History   Problem Relation Age of Onset   . Thyroid Disease Mother    . Hypertension (High Blood Pressure) Mother    . Migraines Mother    . No Known Problems Father    . Alopecia Sister    . COLITIS Brother        Social history:  Social History     Tobacco  Use   . Smoking status: Never   . Smokeless tobacco: Never   Vaping Use   . Vaping Use: Every day   . Substances: Nicotine   . Devices: Pre-filled pod   Substance Use Topics   . Alcohol use: Yes     Comment: occasionally   . Drug use: Yes     Types: Marijuana     Comment: has medical marijuana card; smokes marijuana  2 times a day     Social History     Substance and Sexual Activity   Drug Use Yes   . Types: Marijuana    Comment: has medical marijuana card; smokes marijuana  2 times a day     PCP: Peter Minium, MD    Allergies:   Allergies   Allergen Reactions   . Sulfamethoxazole-Trimethoprim Hives/ Urticaria       Physical Exam      Filed Vitals:    11/05/21 0015 11/05/21 0030 11/05/21 0045 11/05/21 0130   BP: 126/64      Pulse:       Resp:       SpO2: 100% 97% 97% 98%     Physical Exam  Vitals and nursing note reviewed.   Constitutional:       General: She is not in acute distress.     Appearance: Normal appearance.   HENT:      Head: Normocephalic and atraumatic.      Mouth/Throat:      Mouth: Mucous membranes are moist.      Pharynx: Oropharynx is clear.   Eyes:      Conjunctiva/sclera: Conjunctivae normal.   Cardiovascular:      Rate and Rhythm: Normal rate and regular rhythm.      Heart sounds: Normal heart sounds.   Pulmonary:      Effort: Pulmonary effort is normal.       Breath sounds: Normal breath sounds. No wheezing.   Abdominal:      Palpations: Abdomen is soft.      Tenderness: There is abdominal tenderness. There is no guarding or rebound.      Comments: Tender in the right lower quadrant without rebound or guarding   Musculoskeletal:         General: No swelling.      Cervical back: Neck supple.   Skin:     General: Skin is warm and dry.      Capillary Refill: Capillary refill takes less than 2 seconds.   Neurological:      Mental Status: She is alert and oriented to person, place, and time.   Psychiatric:         Behavior: Behavior normal.          Orders     Orders Placed This Encounter   . CT ABDOMEN PELVIS W IV CONTRAST   . URINALYSIS WITH REFLEX MICROSCOPIC AND CULTURE IF POSITIVE   . URINALYSIS, MACRO/MICRO   . CBC/DIFF   . BASIC METABOLIC PANEL   . HCG, SERUM QUALITATIVE, PREGNANCY   . CBC WITH DIFF   . EXTRA TUBES   . BLUE TOP TUBE   . GOLD TOP TUBE   . acetaminophen (TYLENOL) tablet   . ibuprofen (MOTRIN) tablet   . metoclopramide (REGLAN) 5 mg/mL injection   . morphine 4 mg/mL injection   . NS bolus infusion 1,000 mL   . iohexol (OMNIPAQUE 300) infusion   . HYDROcodone-acetaminophen (NORCO) 5-325 mg Oral Tablet   . HYDROcodone-acetaminophen (NORCO)  5-325 mg per tablet   . ondansetron (ZOFRAN ODT) rapid dissolve tablet       Diagnostics     Labs:  Labs reviewed and interpreted by me.  Results for orders placed or performed during the hospital encounter of 11/04/21 (from the past 12 hour(s))   URINALYSIS, MACRO/MICRO   Result Value Ref Range    COLOR Yellow Yellow    APPEARANCE Clear Clear    SPECIFIC GRAVITY 1.016 1.001 - 1.030    PH 7.5 5.0 - 9.0    PROTEIN Negative Negative mg/dL    GLUCOSE Negative Negative mg/dL    KETONES Negative Negative mg/dL    UROBILINOGEN 0.2 0.2 - 1.0 mg/dL    BILIRUBIN Negative Negative mg/dL    BLOOD Negative Negative mg/dL    NITRITE Negative Negative    LEUKOCYTES Negative Negative WBCs/uL   BASIC METABOLIC PANEL   Result Value Ref  Range    SODIUM 138 136 - 145 mmol/L    POTASSIUM 4.1 3.5 - 5.1 mmol/L    CHLORIDE 105 96 - 111 mmol/L    CO2 TOTAL 25 22 - 30 mmol/L    ANION GAP 8 4 - 13 mmol/L    CALCIUM 9.1 8.5 - 10.0 mg/dL    GLUCOSE 92 65 - 301 mg/dL    BUN 14 8 - 25 mg/dL    CREATININE 6.01 0.93 - 1.05 mg/dL    BUN/CREA RATIO 20 6 - 22    ESTIMATED GFR >90 >=60 mL/min/BSA   HCG, SERUM QUALITATIVE, PREGNANCY   Result Value Ref Range    PREGNANCY, SERUM QUALITATIVE Negative Negative   CBC WITH DIFF   Result Value Ref Range    WBC 11.1 (H) 3.7 - 11.0 x10^3/uL    RBC 4.32 3.85 - 5.22 x10^6/uL    HGB 12.9 11.5 - 16.0 g/dL    HCT 23.5 57.3 - 22.0 %    MCV 85.4 78.0 - 100.0 fL    MCH 29.9 26.0 - 32.0 pg    MCHC 35.0 31.0 - 35.5 g/dL    RDW-CV 25.4 27.0 - 62.3 %    PLATELETS 240 150 - 400 x10^3/uL    MPV 11.5 8.7 - 12.5 fL    NEUTROPHIL % 71 %    LYMPHOCYTE % 21 %    MONOCYTE % 5 %    EOSINOPHIL % 1 %    BASOPHIL % 1 %    NEUTROPHIL # 8.13 (H) 1.50 - 7.70 x10^3/uL    LYMPHOCYTE # 2.30 1.00 - 4.80 x10^3/uL    MONOCYTE # 0.53 0.20 - 1.10 x10^3/uL    EOSINOPHIL # <0.10 <=0.50 x10^3/uL    BASOPHIL # <0.10 <=0.20 x10^3/uL    IMMATURE GRANULOCYTE % 1 0 - 1 %    IMMATURE GRANULOCYTE # <0.10 <0.10 x10^3/uL   BLUE TOP TUBE   Result Value Ref Range    RAINBOW/EXTRA TUBE AUTO RESULT Yes        Radiology:  CT ABDOMEN PELVIS W IV CONTRAST   Final Result by Edi, Radresults In (07/14 0122)   Right adnexal cyst measuring 3.5 cm. No other acute process.         Signed by Darrol Poke, MD          EKG:   No results found for this visit on 11/04/21 (from the past 720 hour(s)).    ED Course/MDM      During the patient's stay in the emergency department, the above listed imaging and/or labs  were performed to assist with medical decision making and were reviewed by myself when available for review.    Patient rechecked and remained stable throughout remainder of emergency department course.    All questions/concerns addressed, and patient agrees with disposition  plan.    Medical Decision Making  Improved after symptomatic medications here  Ovarian cyst, torsion not suspected no vomiting, patient well, understands that we are going to disposition her based on gestalt.   Return to ER for worsening whatsoever    Addendum:  We discussed the fact that apparently research shows that most ovarian torsions greater than 4 cm.  Patient happens to have had a previous ovarian torsion when she was younger.  She has had no vomiting today no nausea presently.  She feels comfortable using shared decision-making that we have suggested return the ER for any worsening that we treat her pain for starters at the present time return to ER for any worsening or any vomiting whatsoever for an ultrasound.  Lengthy discussion. SO present.      Amount and/or Complexity of Data Reviewed  Labs: ordered.  Radiology: ordered.      Risk  OTC drugs.  Prescription drug management.  Parenteral controlled substances.        Course          Medications given during ED stay include:  Medications Administered in the ED   HYDROcodone-acetaminophen (NORCO) 5-325 mg per tablet (has no administration in time range)   ondansetron (ZOFRAN ODT) rapid dissolve tablet (has no administration in time range)   acetaminophen (TYLENOL) tablet (1,000 mg Oral Given 11/04/21 2237)   ibuprofen (MOTRIN) tablet (600 mg Oral Given 11/04/21 2237)   metoclopramide (REGLAN) 5 mg/mL injection (5 mg Intravenous Given 11/05/21 0012)   morphine 4 mg/mL injection (4 mg Intravenous Given 11/05/21 0013)   NS bolus infusion 1,000 mL (0 mL Intravenous Stopped 11/05/21 0030)   iohexol (OMNIPAQUE 300) infusion (95 mL Intravenous Given 11/05/21 0035)       Clinical Impression:   Clinical Impression   Cyst of ovary, unspecified laterality (Primary)         Disposition: Discharged      Krystal Clark, MD  11/05/2021, 01:39

## 2021-11-18 ENCOUNTER — Other Ambulatory Visit: Payer: Self-pay

## 2021-11-18 ENCOUNTER — Encounter (INDEPENDENT_AMBULATORY_CARE_PROVIDER_SITE_OTHER): Payer: Self-pay | Admitting: NURSE PRACTITIONER

## 2021-11-18 ENCOUNTER — Ambulatory Visit (INDEPENDENT_AMBULATORY_CARE_PROVIDER_SITE_OTHER): Payer: MEDICAID | Admitting: NURSE PRACTITIONER

## 2021-11-18 VITALS — BP 120/70 | HR 91 | Temp 98.0°F | Ht 62.0 in | Wt 156.0 lb

## 2021-11-18 DIAGNOSIS — Z6828 Body mass index (BMI) 28.0-28.9, adult: Secondary | ICD-10-CM

## 2021-11-18 DIAGNOSIS — R2232 Localized swelling, mass and lump, left upper limb: Secondary | ICD-10-CM

## 2021-11-18 DIAGNOSIS — F419 Anxiety disorder, unspecified: Secondary | ICD-10-CM

## 2021-11-18 MED ORDER — AMOXICILLIN 875 MG-POTASSIUM CLAVULANATE 125 MG TABLET
1.0000 | ORAL_TABLET | Freq: Two times a day (BID) | ORAL | 0 refills | Status: AC
Start: 2021-11-18 — End: 2021-11-28

## 2021-11-18 NOTE — Progress Notes (Signed)
PRIMARY CARE, [facility]  [address] [place] [address]  Phone: [phone]  Fax: [phone]    Encounter Date: 11/18/2021    Patient ID:  Traci [name]  YBW:[id number]    DOB: 29-Nov-2000  Age: 21 y.o. female    Subjective:     Chief Complaint   Patient presents with    Anxiety     The patient presents today for an acute appointment.  She has anxiety which has always been a chronic problem for her.  She is tried multiple medications in the past without much success.  She has been referred to Psychiatry in the past however, stopped going.  Today, she is wanting to have a referral to see a psychiatrist.  She states anxiety has been worsening.  She denies depressed thoughts.  She denies suicidal or homicidal ideations.  Medication she has been on in the past include Lexapro, Atarax, Effexor, BuSpar.    Additionally, she has been seen recently for a lump under her left axilla.  She had ultrasound completed and those results re as follows:    PACS Images     Show images for [place] AXILLA(ONLY) LEFT BREAST-ADDL VIEWS/BREAST US AS REQ BY RAD   Imaging Services, [facility]   [address]  [place] [address]   Phone: [phone] Fax: [phone]     PATIENT NAME: Traci, [name] MED REC NO: [id number]  BIRTH DATE: 02-17-2001 ORDER: [id number]  SEX: F ORDER FROM: [facility]  REQUESTING PHYS: Ilsa Iha SERVICE DATE: 09/29/2021 12:40 PM   ATTENDING Vaughan BrownerIlsa Iha REPORT DATE: 09/29/2021  2:33 PM  REASON: Lump of axilla, left      Final  [place] AXILLA(ONLY) LEFT BREAST                       Study Result    Narrative & Impression  INDICATION: Palpable lump in the left axilla     COMPARISON: None     FINDINGS: At the site of the lump, there is only normal tissue. However, slightly inferior to the lump, within the left axilla, several lymph nodes are identified. One measures 1.9 x 0.5 x 0.8 cm with a cortical thickness of 2.5 mm. Another measures 2.2 x 0.7 x 1.3 cm with a cortical  thickness of 2.2 mm. These are both reniform with a fatty central region.     IMPRESSION:  No suspicious findings. No abnormality identified at the site of the lump. 2 benign-appearing lymph nodes identified in the left axilla.        Signed by Campbell Stall, MD        She tells me today that she is still having pain in the area.  Denies any drainage or open area.  There has been no fever or chills.    Current Outpatient Medications   Medication Sig    albuterol sulfate (PROVENTIL OR VENTOLIN OR PROAIR) 90 mcg/actuation Inhalation oral inhaler Take 1-2 Puffs by inhalation Every 6 hours as needed    amoxicillin-pot clavulanate (AUGMENTIN) 875-125 mg Oral Tablet Take 1 Tablet by mouth Twice daily for 10 days    busPIRone (BUSPAR) 7.5 mg Oral Tablet Take 1 Tablet (7.5 mg total) by mouth Twice daily    Cholecalciferol, Vitamin D3, (VITAMIN D-3) 50 mcg (2,000 unit) Oral Capsule Take 1 Capsule by mouth Once a day    HYDROcodone-acetaminophen (NORCO) 5-325 mg Oral Tablet Take 1 Tablet by mouth Every 6 hours  as needed for Pain    medroxyPROGESTERone (DEPO-PROVERA) 150 mg/mL IntraMUSCULAR Suspension INJECT 1 ML (150 MG TOTAL) INTRAMUSCULARLY EVERY 3 MONTHS    Methylprednisolone (MEDROL DOSEPACK) 4 mg Oral Tablets, Dose Pack Take as instructed.    mirtazapine (REMERON) 15 mg Oral Tablet TAKE ONE TABLET BY MOUTH nightly for 30 days    MOTEGRITY 2 mg Oral Tablet     pantoprazole (PROTONIX) 40 mg Oral Tablet, Delayed Release (E.C.) Take 1 Tablet (40 mg total) by mouth Once a day    phenazopyridine (PYRIDIUM) 10 mg/mL Oral Liquid Please take 85mL three times daily prn UTI sx    promethazine-dextromethorphan (PHENERGAN-DM) 6.25-15 mg/5 mL Oral Syrup Take 5 mL by mouth Four times a day as needed for Cough    Syringe with Needle, Disp, (MONOJECT 3CC SYR 25GX1") 3 mL 25 gauge x 1" Syringe Use once a month    tamsulosin (FLOMAX) 0.4 mg Oral Capsule Take 1 Capsule (0.4 mg total) by mouth Every evening after dinner    topiramate  (TOPAMAX) 50 mg Oral Tablet Take 50 mg by mouth Every night     Allergies   Allergen Reactions    Sulfamethoxazole-Trimethoprim Hives/ Urticaria     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    PONV (postoperative nausea and vomiting)     Smoker     vapes with nicotine    UTI (urinary tract infection)     Vitamin D deficiency          Past Surgical History:   Procedure Laterality Date    FOOT SURGERY Right     HX APPENDECTOMY      HX CHOLECYSTECTOMY      HX DENTAL EXTRACTION      HX FOOT SURGERY      HX OTHER  10/30/2015    DIAG LAPARO SEPARATE PROC - fulgration of endometriotic implants    HX OTHER      ORIF Right foot    HX TONSIL AND ADENOIDECTOMY      HX TONSILLECTOMY      LAPAROSCOPIC ENDOMETRIOSIS FULGURATION      LAPAROSCOPIC ENDOMETRIOSIS FULGURATION      2nd surgery was to also drain a blood clot.         Family Medical History:       Problem Relation (Age of Onset)    Alopecia Sister    COLITIS Brother    Hypertension (High Blood Pressure) Mother    Migraines Mother    No Known Problems Father    Thyroid Disease Mother            Social History     Tobacco Use    Smoking status: Never    Smokeless tobacco: Never   Vaping Use  Vaping Use: Every day    Substances: Nicotine    Devices: Pre-filled pod   Substance Use Topics    Alcohol use: Yes     Comment: occasionally    Drug use: Yes     Types: Marijuana     Comment: has medical marijuana card; smokes marijuana  2 times a day       Review of Systems   Constitutional: Negative.    HENT:  Negative.     Eyes: Negative.    Respiratory: Negative.     Cardiovascular: Negative.    Gastrointestinal: Negative.    Endocrine: Negative.    Genitourinary: Negative.    Musculoskeletal: Negative.    Skin:         + lump under left axilla.    Neurological: Negative.    Hematological: Negative.    Psychiatric/Behavioral:  The patient is nervous/anxious.    Objective:   Vitals: BP 120/70   Pulse 91   Temp 36.7 C (98 F) (Thermal Scan)   Ht 1.575 m (5\' 2" )   Wt 70.8 kg (156 lb)   LMP 10/12/2021   SpO2 97%   BMI 28.53 kg/m         Physical Exam  HENT:      Head: Normocephalic.      Nose: Nose normal.      Mouth/Throat:      Mouth: Mucous membranes are moist.   Eyes:      Extraocular Movements: Extraocular movements intact.      Pupils: Pupils are equal, round, and reactive to light.   Cardiovascular:      Rate and Rhythm: Normal rate and regular rhythm.      Pulses: Normal pulses.      Heart sounds: Normal heart sounds.   Pulmonary:      Effort: Pulmonary effort is normal.      Breath sounds: Normal breath sounds.   Chest:       Abdominal:      Palpations: Abdomen is soft.      Tenderness: There is no abdominal tenderness.   Musculoskeletal:         General: Normal range of motion.      Cervical back: Normal range of motion and neck supple.   Skin:     General: Skin is warm and dry.      Capillary Refill: Capillary refill takes less than 2 seconds.   Neurological:      General: No focal deficit present.      Mental Status: She is alert and oriented to person, place, and time.   Psychiatric:         Mood and Affect: Mood normal.         Behavior: Behavior normal.       Assessment & Plan:     ENCOUNTER DIAGNOSES     ICD-10-CM   1. Anxiety  F41.9   2. Lump of axilla, left  R22.32   3. BMI 28.0-28.9,adult  Z68.28       Orders Placed This Encounter    Referral to External Provider (AMB)    Refer to [facility]    amoxicillin-pot clavulanate (AUGMENTIN) 875-125 mg Oral Tablet     Patient  does not wish to start medication for anxiety at this time.  Would like to have referral to Psychiatry.  Referral placed.  Additionally, will send course of Augmentin to the pharmacy.  Left axilla is still tender to touch.  Referral  placed to general surgery for further evaluation/recommendation.  Patient advised to keep routine follow-up appointment scheduled, return sooner if needed in the interim.    [employer], CRNP

## 2021-11-18 NOTE — Nursing Note (Signed)
Patient is here today for worsening anxiety. Would like to see about getting a new referral for [facility].

## 2021-12-07 ENCOUNTER — Ambulatory Visit (INDEPENDENT_AMBULATORY_CARE_PROVIDER_SITE_OTHER): Payer: Self-pay | Admitting: Surgery

## 2021-12-14 ENCOUNTER — Encounter (HOSPITAL_COMMUNITY): Payer: Self-pay

## 2021-12-14 ENCOUNTER — Emergency Department
Admission: EM | Admit: 2021-12-14 | Discharge: 2021-12-14 | Disposition: A | Discharge status: Home / Self Care | Payer: MEDICAID | Attending: Medical | Admitting: Medical

## 2021-12-14 ENCOUNTER — Other Ambulatory Visit: Payer: Self-pay

## 2021-12-14 ENCOUNTER — Emergency Department (HOSPITAL_COMMUNITY): Payer: MEDICAID

## 2021-12-14 DIAGNOSIS — F17290 Nicotine dependence, other tobacco product, uncomplicated: Secondary | ICD-10-CM | POA: Insufficient documentation

## 2021-12-14 DIAGNOSIS — W19XXXA Unspecified fall, initial encounter: Secondary | ICD-10-CM

## 2021-12-14 DIAGNOSIS — W109XXA Fall (on) (from) unspecified stairs and steps, initial encounter: Secondary | ICD-10-CM | POA: Insufficient documentation

## 2021-12-14 DIAGNOSIS — M25562 Pain in left knee: Secondary | ICD-10-CM | POA: Insufficient documentation

## 2021-12-14 NOTE — ED Provider Notes (Signed)
[facility]       Attending ED APP:  Gerhard Munch. Elmon Kirschner, [place]  Name: Traci Traci [name]  Age and Gender: 21 y.o. female  PCP: Peter Minium, MD    History of Present Illness      Chief Complaint   Patient presents with    Fall    Knee Pain     Left         Traci Traci [name], date of birth April 12, 2001, is a 21 y.o. female who arrived by Ambulance    HPI:  The patient presents to the emergency department tonight via ambulance after falling down steps at home.  She states she hit her head on the door but she did not have any loss of consciousness and is not complaining of a headache or neck pain this time.  She said most to the pain is to the left knee because initially it was dislocated laterally and she felt a pop and it went back into place when she bent it.  No prior injury to the left lower extremity.  She denies numbness and tingling.  Aggravated by palpation, movement, weight-bearing  Alleviated some with the fentanyl 75 mcg she was given via EMS  Occurred prior to arrival    Review of Systems   All other systems reviewed and are negative.       Historical Data    History Reviewed This Encounter: Medical History  Surgical History  Family History  Social History     Past Medical History:  Diagnosis  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    PONV (postoperative nausea and vomiting)     Smoker     vapes with nicotine    UTI (urinary tract infection)     Vitamin D deficiency        Past Surgical History:  Past Surgical History:    Procedure Laterality Date    Foot surgery Right     Hx cholecystectomy      Hx dental extraction      Hx foot surgery      Hx other  10/30/2015    Hx other      Hx tonsil and adenoidectomy      Hx tonsillectomy      Laparoscopic endometriosis fulguration      Laparoscopic endometriosis fulguration         Family History:   Family History   Problem Relation Age of Onset    Thyroid Disease Mother     Hypertension (High Blood Pressure) Mother     Migraines Mother     No Known Problems Father     Alopecia Sister     COLITIS Brother        Social History  Social History  Tobacco Use    Smoking status: Never    Smokeless tobacco: Never   Vaping Use    Vaping Use: Every day    Substances: Nicotine    Devices: Pre-filled pod   Substance Use Topics    Alcohol use: Yes     Comment: occasionally    Drug use: Yes     Types: Marijuana     Comment: has medical marijuana card; smokes marijuana  2 times a day       Social History     Substance and Sexual Activity   Drug Use Yes    Types: Marijuana    Comment: has medical marijuana card; smokes marijuana  2 times a day       Allergies   Allergen Reactions    Sulfamethoxazole-Trimethoprim Hives/ Urticaria       No outpatient medications have been marked as taking for the 12/14/21 encounter [facility] Encounter).           Physical Exam    ED Triage Vitals [12/14/21 0241]   BP (Non-Invasive) 124/71   Heart Rate 72   Respiratory Rate 18   Temp    SpO2 94 %   Weight 73.5 kg (162 lb)   Height 1.575 m (5\' 2" )       Physical Exam  Vitals and nursing note reviewed.   Constitutional:       Appearance: Normal appearance. She is obese.   HENT:      Head: Normocephalic and atraumatic.   Eyes:      Extraocular Movements: Extraocular movements intact.      Conjunctiva/sclera: Conjunctivae normal.      Pupils: Pupils are equal, Traci [name], and reactive to light.   Musculoskeletal:         General: Swelling, tenderness and signs of injury present. No deformity.      Cervical back: Normal range of  motion.      Right lower leg: No edema.      Left lower leg: No edema.      Comments: There is no gross or palpable deformity to the left knee joint at this time.  Tenderness noted to left knee but the tib-fib left foot ankle toes and left hip are without tenderness to palpation.  She will not perform range of motion to the left knee because of the pain caused with that.  I was able to bend the left knee some and there was no laxity to the joint during my exam.  Sensation was   Skin:     General: Skin is warm.      Capillary Refill: Capillary refill takes less than 2 seconds.      Findings: Erythema present. No bruising.      Comments: Erythema with superficial abrasions to the left anterior   Neurological:      General: No focal deficit present.      Mental Status: She is alert and oriented to person, place, and time.      Cranial Nerves: No cranial nerve deficit.      Sensory: No sensory deficit.   Psychiatric:         Behavior: Behavior normal.          DDx:  Differential diagnosis includes, but is not limited to fall, left knee dislocation, left knee fracture, left knee patellar ligament injury/rupture, left meniscal tear, left knee effusion, fall, left knee patellar fracture      Orders Placed This Encounter    XR KNEE  LEFT 4 OR MORE VIEWS         Patient Data    Labs and x-rays reviewed by myself  No results found for this or any previous visit (from the past 12 hour(s)).     XR KNEE LEFT 4 OR MORE VIEWS   Final Result by Edi, Radresults In (08/22 0400)   No acute fracture or malalignment.         Signed by Delilah Shan, DO              EKG:  No results found for this visit on 12/14/21 (from the past 720 hour(s)).      ED Course:  Relevant prior external notes and tests were reviewed by myself if available.  During the patient's stay in the emergency department, the above listed imaging and/or labs were performed to assist with medical decision making and were reviewed by myself when available for review.    Patient rechecked throughout remainder of emergency department course.   All questions/concerns addressed to the best of our ability.    ED Course as of 12/14/21 0420   Tue Dec 14, 2021   0401 IMPRESSION:  No acute fracture or malalignment.        Signed by Delilah Shan, DO           Medical Decision Making         Medical Decision Making  20 year old white female alert and oriented no acute distress vital signs are stable here with left knee pain that she states was dislocated after she fell down 5 steps prior to her arrival here.  Physical exam there is no palpable gross deformity, dislocation.  There is left prepatellar erythema and superficial abrasions no active bleeding.    Amount and/or Complexity of Data Reviewed  Radiology: ordered. Decision-making details documented in ED Course.    Risk  OTC drugs.  Risk Details: Alternate Tylenol and ibuprofen as needed for pain and/or swelling  Knee immobilizer and crutches as needed  Ice therapy  Call ortho for follow-up appointment  Return to the emergency department if there are any new or worsening            Clinical Impression:  Clinical Impression   Fall, initial encounter (Primary)   Acute pain of left knee          Disposition:  Discharged       Traci Round, DO  [address]  [place] [address]  [phone]    Call         Current Discharge Medication List             //  Cala Bradford B. Athena Masse , 02:44     [facility], [facility]     The co-signing faculty was physically present in the emergency department and available for consultation and did not participate in the care of this patient.    This chart may have been completed after the conclusion of this patient's care due to the time constraints of simultaneous responsibilities of direct patient care activities during my clinical shift in the emergency department.     This note was partially generated using MModal Fluency Direct system, and there may be  some incorrect words, spellings, and punctuation's that were not noted in checking the note before saving.    54}

## 2021-12-14 NOTE — ED Nurses Note (Signed)
Patient discharged home with family.  AVS reviewed with patient/care giver.  A written copy of the AVS and discharge instructions was given to the patient/care giver.  Questions sufficiently answered as needed.  Patient/care giver encouraged to follow up with PCP as indicated.  In the event of an emergency, patient/care giver instructed to call 911 or go to the nearest emergency room.

## 2021-12-14 NOTE — Discharge Instructions (Addendum)
Call the orthopedist listed above if the pain does not improve in 3-5 days  Knee immobilizer and crutches as needed  Rice therapy  Alternate Tylenol and ibuprofen for pain and/or swelling  Return to the emergency department if you have new worsening

## 2021-12-14 NOTE — ED Triage Notes (Signed)
Pt states "I fell down the steps going down the steps, I hit my head into the door but my knee took most of the fall. My left knee was to the side initially but I felt a pop and it went back into place"

## 2021-12-17 ENCOUNTER — Encounter (HOSPITAL_COMMUNITY): Payer: Self-pay | Admitting: ORTHOPAEDIC SURGERY

## 2021-12-17 ENCOUNTER — Ambulatory Visit: Payer: MEDICAID | Attending: ORTHOPAEDIC SURGERY | Admitting: ORTHOPAEDIC SURGERY

## 2021-12-17 ENCOUNTER — Other Ambulatory Visit: Payer: Self-pay

## 2021-12-17 VITALS — Ht 62.0 in | Wt 162.0 lb

## 2021-12-17 DIAGNOSIS — W108XXA Fall (on) (from) other stairs and steps, initial encounter: Secondary | ICD-10-CM

## 2021-12-17 DIAGNOSIS — S83005A Unspecified dislocation of left patella, initial encounter: Secondary | ICD-10-CM | POA: Insufficient documentation

## 2021-12-17 NOTE — Progress Notes (Signed)
[facility]  DEPARTMENT OF ORTHOPAEDICS    Clinic Note       Name: Traci [name]  MRN: [id number]    Patient ID: Traci [name] is an 21 y.o. female.  Chief Complaint:     Chief Complaint   Patient presents with    ED Follow-up     Lt knee        HPI/Subjective:      Patient is a very pleasant 21 year old that fell down the stairs and suffered a patellar dislocation.  She was reduced by the EMT with the leg manipulation.  She was still brought to the ER.  They assessed her gave her a splint and sent over here.    She describes the pain as moderately dull with sharp pains during activity.  The pain can be rated at a 4/10.  It is improved by rest, ice, or sleep.  There is a post activity pain - but it is less significant.  Pain is improved by over-the-counter medications.  Pain is made worse by significant or forceful activity. It does occur during ADL and home activities.  The pain does vary from day-to-day.      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    PONV (postoperative nausea and vomiting)     Smoker     vapes with nicotine    UTI (urinary tract infection)     Vitamin D deficiency          Past Surgical History:   Procedure Laterality Date    FOOT SURGERY Right     HX CHOLECYSTECTOMY      HX DENTAL EXTRACTION      HX FOOT SURGERY      HX OTHER  10/30/2015    DIAG LAPARO SEPARATE PROC - fulgration of endometriotic implants    HX OTHER      ORIF Right foot    HX TONSIL AND ADENOIDECTOMY      HX TONSILLECTOMY       LAPAROSCOPIC ENDOMETRIOSIS FULGURATION      LAPAROSCOPIC ENDOMETRIOSIS FULGURATION      2nd surgery was to also drain a blood clot.         Family Medical History:       Problem Relation (Age of Onset)    Alopecia Sister    COLITIS Brother    Hypertension (High Blood Pressure) Mother    Migraines Mother    No Known Problems Father    Thyroid Disease Mother             Current Outpatient Medications   Medication Sig    acetaminophen (TYLENOL) 500 mg Oral Tablet Take 1 Tablet (500 mg total) by mouth Every 4 hours as needed for  Pain    albuterol sulfate (PROVENTIL OR VENTOLIN OR PROAIR) 90 mcg/actuation Inhalation oral inhaler Take 1-2 Puffs by inhalation Every 6 hours as needed    busPIRone (BUSPAR) 7.5 mg Oral Tablet Take 1 Tablet (7.5 mg total) by mouth Twice daily    Cholecalciferol, Vitamin D3, (VITAMIN D-3) 50 mcg (2,000 unit) Oral Capsule Take 1 Capsule by mouth Once a day    HYDROcodone-acetaminophen (NORCO) 5-325 mg Oral Tablet Take 1 Tablet by mouth Every 6 hours as needed for Pain    Ibuprofen (MOTRIN) 800 mg Oral Tablet Take 1 Tablet (800 mg total) by mouth Three times a day as needed for Pain    medroxyPROGESTERone (DEPO-PROVERA) 150 mg/mL IntraMUSCULAR Suspension INJECT 1 ML (150 MG TOTAL) INTRAMUSCULARLY EVERY 3 MONTHS    Methylprednisolone (MEDROL DOSEPACK) 4 mg Oral Tablets, Dose Pack Take as instructed.    mirtazapine (REMERON) 15 mg Oral Tablet TAKE ONE TABLET BY MOUTH nightly for 30 days    MOTEGRITY 2 mg Oral Tablet     pantoprazole (PROTONIX) 40 mg Oral Tablet, Delayed Release (E.C.) Take 1 Tablet (40 mg total) by mouth Once a day (Patient not taking: Reported on 12/17/2021)    phenazopyridine (PYRIDIUM) 10 mg/mL Oral Liquid Please take 43mL three times daily prn UTI sx    promethazine-dextromethorphan (PHENERGAN-DM) 6.25-15 mg/5 mL Oral Syrup Take 5 mL by mouth Four times a day as needed for Cough    Syringe with Needle, Disp, (MONOJECT 3CC SYR 25GX1") 3 mL 25 gauge x 1" Syringe Use once a  month    tamsulosin (FLOMAX) 0.4 mg Oral Capsule Take 1 Capsule (0.4 mg total) by mouth Every evening after dinner    topiramate (TOPAMAX) 50 mg Oral Tablet Take 50 mg by mouth Every night     Allergies   Allergen Reactions    Sulfamethoxazole-Trimethoprim Hives/ Urticaria      Social History     Substance and Sexual Activity   Alcohol Use Yes    Comment: occasionally     Social History     Tobacco Use   Smoking Status Never   Smokeless Tobacco Never     Social History     Substance and Sexual Activity   Drug Use Yes    Types: Marijuana    Comment: has medical marijuana card; smokes marijuana  2 times a day       Review of Systems:  Constitutional: Negative for activity change, appetite change, diaphoresis and fatigue.   HENT: Negative for hearing loss, nosebleeds, postnasal drip, rhinorrhea, sinus pain and sore throat.    Eyes: Negative for photophobia, discharge and redness.   Respiratory: Negative for cough, choking, chest tightness, shortness of breath and wheezing.    Cardiovascular: Negative for chest pain, palpitations and leg swelling.   Gastrointestinal: Negative for abdominal distention, constipation, diarrhea, nausea and vomiting.   Genitourinary: Negative for difficulty urinating, dysuria, hematuria and urgency.   Neurological: Negative for dizziness, seizures, light-headedness and numbness.   Psychiatric/Behavioral: Negative for agitation, confusion, dysphoric mood and sleep disturbance.     Physical Exam  Vitals:    12/17/21 1211   Weight: 73.5 kg (162 lb)   Height: 1.575 m (5\' 2" )   BMI: 29.69      Constitutional:       General: She is not in acute distress.     Appearance: Normal appearance. She is not ill-appearing or diaphoretic.   Eyes:      Extraocular Movements: Extraocular movements intact.  Pupils: Pupils are equal, round, and reactive to light.   Cardiovascular:      Rate and Rhythm: Normal rate and regular rhythm.   Pulmonary:      Effort: Pulmonary effort is normal.      Breath sounds:  Normal breath sounds.   Abdominal:      General: Abdomen is flat. There is no distension.      Palpations: Abdomen is soft.      Tenderness: There is no abdominal tenderness.   Skin:     Coloration: Skin is not jaundiced.      Findings: No Rash. No lesion.   Neurological:      Mental Status: She is alert.   Musculoskeletal:      Left Knee Exam   Tenderness   The patient is experiencing no tenderness in the patella  No tenderness in patellar tendon  Tenderness over medialjoint line / MCL  Range of Motion   Extension:  0 abnormal   Flexion:  120 abnormal   Tests   McMurray:  Medial - positive Lateral - negative  Varus: positive and tender opens 2-39mm; Valgus: negative  Lachman:  Anterior - negative      Drawer:  Anterior - negative      Pivot: shift: negative  Patellar apprehension:   Significant  Other   Erythema: absent  Sensation: normal  Pulse: present  Swelling: mild   J-sign: Positive   Medial retinaculum: Tender   Q angle: Elevated      Assessment:  Assessment/Plan   1. Patellar dislocation, left, initial encounter         Think the main issue here subluxation/dislocation.  Will watch and see how she does and then make further recommendations.    Plan:  Orders Placed This Encounter    Refer to Physical Therapy-External       There are no exam notes on file for this visit.    Arnell Sieving,   12/17/2021          Peter Minium, MD  [address]  [place] [address]

## 2021-12-20 ENCOUNTER — Encounter (INDEPENDENT_AMBULATORY_CARE_PROVIDER_SITE_OTHER): Payer: Self-pay | Admitting: NURSE PRACTITIONER

## 2021-12-21 ENCOUNTER — Ambulatory Visit (HOSPITAL_COMMUNITY): Payer: Self-pay | Admitting: ORTHOPAEDIC SURGERY

## 2021-12-21 NOTE — Telephone Encounter (Signed)
Regarding: Brace  ----- Message from [name] sent at 12/21/2021  9:04 AM EDT -----  Traci [name] pt-    Pt is calling to see about a new knee brace and that she would be stopping in. She said that she was told to stop in an hour after she calls today. If there are no braces in office please give pt a call back. Thanks.

## 2021-12-22 ENCOUNTER — Encounter (HOSPITAL_COMMUNITY): Payer: Self-pay | Admitting: Medical

## 2021-12-22 ENCOUNTER — Ambulatory Visit: Payer: MEDICAID | Attending: Medical | Admitting: Medical

## 2021-12-22 ENCOUNTER — Other Ambulatory Visit: Payer: Self-pay

## 2021-12-22 VITALS — Ht 62.0 in | Wt 162.0 lb

## 2021-12-22 DIAGNOSIS — S83005A Unspecified dislocation of left patella, initial encounter: Secondary | ICD-10-CM | POA: Insufficient documentation

## 2021-12-22 NOTE — Progress Notes (Signed)
Patient was prescribed a knee immobilizer for left patella dislocation. The patient is ambulatory, but has weakness and/or deformity due to a recent injury or surgical procedure on their left knee which requires stabilization from this semi-rigid/rigid orthosis to improve their function.     Knee instability must be documented by examination of the beneficiary with objective description of joint laxity (varus/valgus/instability,anterior/posterior drawer test)

## 2021-12-22 NOTE — Progress Notes (Signed)
[facility]  DEPARTMENT OF ORTHOPAEDICS    Clinic Note       Name: Traci [name]  MRN: [id number]    Patient ID: Traci [name] is an 21 y.o. female.  Chief Complaint:     Chief Complaint   Patient presents with    Knee Pain     Left         HPI/Subjective:    Patient is a very pleasant 21 year old.  She was in a couple of weeks ago at that time had significant pain.  She suspected of having a patellar dislocation.  She presents back today with the significant pain.  Pain seems a little out of proportion with clinical symptoms.  Rates it at an 8 or 9/10.  She does state that she fell down the stairs again a couple of days ago.    Current Outpatient Medications   Medication Sig    acetaminophen (TYLENOL) 500 mg Oral Tablet Take 1 Tablet (500 mg total) by mouth Every 4 hours as needed for Pain    albuterol sulfate (PROVENTIL OR VENTOLIN OR PROAIR) 90 mcg/actuation Inhalation oral inhaler Take 1-2 Puffs by inhalation Every 6 hours as needed    busPIRone (BUSPAR) 7.5 mg Oral Tablet Take 1 Tablet (7.5 mg total) by mouth Twice daily    Cholecalciferol, Vitamin D3, (VITAMIN D-3) 50 mcg (2,000 unit) Oral Capsule Take 1 Capsule by mouth Once a day    HYDROcodone-acetaminophen (NORCO) 5-325 mg Oral Tablet Take 1 Tablet by mouth Every 6 hours as needed for Pain    Ibuprofen (MOTRIN) 800 mg Oral Tablet Take 1 Tablet (800 mg total) by mouth Three times a day as needed for Pain    medroxyPROGESTERone (DEPO-PROVERA) 150 mg/mL IntraMUSCULAR Suspension INJECT 1 ML (150 MG TOTAL) INTRAMUSCULARLY EVERY 3 MONTHS    Methylprednisolone (MEDROL DOSEPACK) 4 mg Oral Tablets, Dose Pack Take as instructed.    mirtazapine (REMERON) 15 mg Oral Tablet TAKE ONE TABLET BY MOUTH nightly for 30 days    MOTEGRITY 2 mg Oral Tablet     pantoprazole (PROTONIX) 40 mg Oral Tablet, Delayed Release (E.C.) Take 1 Tablet (40 mg total) by mouth Once a day    phenazopyridine (PYRIDIUM) 10 mg/mL Oral Liquid Please take 40mL three  times daily prn UTI sx    promethazine-dextromethorphan (PHENERGAN-DM) 6.25-15 mg/5 mL Oral Syrup Take 5 mL by mouth Four times a day as needed for Cough    Syringe with Needle, Disp, (MONOJECT 3CC SYR 25GX1") 3 mL 25 gauge x 1" Syringe Use once a month    tamsulosin (FLOMAX) 0.4 mg Oral Capsule Take 1 Capsule (0.4 mg total) by mouth Every evening after dinner    topiramate (TOPAMAX) 50 mg Oral Tablet Take 50 mg by mouth Every night     Allergies   Allergen Reactions    Sulfamethoxazole-Trimethoprim Hives/ Urticaria      Social History     Substance and Sexual Activity   Alcohol Use Yes    Comment: occasionally     Social History     Tobacco Use   Smoking Status Never   Smokeless Tobacco Never     Social History     Substance and Sexual Activity   Drug Use Yes    Types: Marijuana    Comment: has medical marijuana card; smokes marijuana  2 times a day       Review of Systems:  Constitutional: Negative for activity change, appetite change, diaphoresis  and fatigue.   HENT: Negative for hearing loss, nosebleeds, postnasal drip, rhinorrhea, sinus pain and sore throat.    Eyes: Negative for photophobia, discharge and redness.   Respiratory: Negative for cough, choking, chest tightness, shortness of breath and wheezing.    Cardiovascular: Negative for chest pain, palpitations and leg swelling.   Gastrointestinal: Negative for abdominal distention, constipation, diarrhea, nausea and vomiting.   Genitourinary: Negative for difficulty urinating, dysuria, hematuria and urgency.   Neurological: Negative for dizziness, seizures, light-headedness and numbness.   Psychiatric/Behavioral: Negative for agitation, confusion, dysphoric mood and sleep disturbance.     Physical Exam  Vitals:    12/22/21 0943   Weight: 73.5 kg (162 lb)   Height: 1.575 m (5\' 2" )   BMI: 29.69      Constitutional:       General: She is not in acute distress.     Appearance: Normal appearance. She is not ill-appearing or diaphoretic.   Eyes:      Extraocular  Movements: Extraocular movements intact.      Pupils: Pupils are equal, round, and reactive to light.   Skin:     Coloration: Skin is not jaundiced.      Findings: No Rash. No lesion.   Neurological:      Mental Status: She is alert.   Musculoskeletal:      Knee has a range of 0-90.    Imaging:  X-rays are normal.    Assessment:  Assessment/Plan   1. Patellar dislocation, left, initial encounter        At this stage my recommendation is to proceed with an MRI.  Clinical examination is fairly benign.  She has such significant pain that it is hard to take it through exam.  I can range is 0-90.  She is good stability in varus and valgus.  ACL and PCL seems stable.  Patellar retinaculum is nontender.  Patella tracks centrally.    Plan:  Orders Placed This Encounter    MRI Left Knee without contrast    DME - [facility]         There are no exam notes on file for this visit.      , MD,   12/22/2021      12/24/2021, MD  [address]  [place]

## 2021-12-28 ENCOUNTER — Encounter (INDEPENDENT_AMBULATORY_CARE_PROVIDER_SITE_OTHER): Payer: MEDICAID | Admitting: NURSE PRACTITIONER

## 2021-12-28 ENCOUNTER — Encounter (HOSPITAL_COMMUNITY): Payer: Self-pay | Admitting: Medical

## 2022-01-05 ENCOUNTER — Other Ambulatory Visit (HOSPITAL_COMMUNITY): Payer: Self-pay | Admitting: Medical

## 2022-01-05 ENCOUNTER — Encounter (INDEPENDENT_AMBULATORY_CARE_PROVIDER_SITE_OTHER): Payer: MEDICAID | Admitting: NURSE PRACTITIONER

## 2022-01-13 ENCOUNTER — Other Ambulatory Visit: Payer: Self-pay

## 2022-01-13 ENCOUNTER — Ambulatory Visit
Admission: RE | Admit: 2022-01-13 | Discharge: 2022-01-13 | Disposition: A | Discharge status: Home / Self Care | Payer: MEDICAID | Source: Ambulatory Visit | Attending: Medical | Admitting: Medical

## 2022-01-13 DIAGNOSIS — S83005A Unspecified dislocation of left patella, initial encounter: Secondary | ICD-10-CM | POA: Insufficient documentation

## 2022-01-14 ENCOUNTER — Ambulatory Visit (HOSPITAL_COMMUNITY): Payer: Self-pay | Admitting: ORTHOPAEDIC SURGERY

## 2022-01-20 ENCOUNTER — Ambulatory Visit: Payer: MEDICAID | Attending: Podiatrist | Admitting: ORTHOPAEDIC SURGERY

## 2022-01-20 ENCOUNTER — Other Ambulatory Visit: Payer: Self-pay

## 2022-01-20 ENCOUNTER — Encounter (HOSPITAL_COMMUNITY): Payer: Self-pay | Admitting: ORTHOPAEDIC SURGERY

## 2022-01-20 VITALS — Ht 63.0 in | Wt 148.0 lb

## 2022-01-20 DIAGNOSIS — S83412A Sprain of medial collateral ligament of left knee, initial encounter: Secondary | ICD-10-CM | POA: Insufficient documentation

## 2022-01-20 DIAGNOSIS — S83005A Unspecified dislocation of left patella, initial encounter: Secondary | ICD-10-CM | POA: Insufficient documentation

## 2022-01-20 NOTE — Nursing Note (Signed)
Patient was prescribed a Functional Hinged Knee Orthosis for patellar dislocation. The patient is ambulatory, but has weakness and/or deformity due to a recent injury or surgical procedure on their left knee which requires stabilization from this semi-rigid/rigid orthosis to improve their function.     Knee instability must be documented by examination of the beneficiary with objective description of joint laxity (varus/valgus/instability,anterior/posterior drawer test)

## 2022-01-20 NOTE — Progress Notes (Signed)
[facility]       Name: Traci [name]  MRN: [id number]    Patient ID: Traci [name] is an 21 y.o. female.  Chief Complaint:     Chief Complaint   Patient presents with    Re-eval     4 wk f/u lt patellar dislocation        HPI/Subjective:    Patient is a very pleasant 21 year old.  I would initially thought that she had a patellar subluxation episode and sent her for an MRI to evaluate her retinaculum.  One of my differential diagnoses was MCL tear and so I would left her in a knee immobilizer.  The MRI is back and it does in fact confirm an MCL injury.    She describes the pain as sharp in character.  The pain can be rated at a 6/10.  It is typically improved by rest, ice, activity modifications.  There is a mild amount of post activity pain.  Pain is improved by over-the-counter medications.  Both NSAIDs and Tylenol seem to help.  Pain is made worse by significant or forceful exercise.  Also long periods of use do exacerbate the pain.      Current Outpatient Medications   Medication Sig    acetaminophen (TYLENOL) 500 mg Oral Tablet Take 1 Tablet (500 mg total) by mouth Every 4 hours as needed for Pain    albuterol sulfate (PROVENTIL OR VENTOLIN OR PROAIR) 90 mcg/actuation Inhalation oral inhaler Take 1-2 Puffs by inhalation Every 6 hours as needed    busPIRone (BUSPAR) 7.5 mg Oral Tablet Take 1 Tablet (7.5 mg total) by mouth Twice daily    Cholecalciferol, Vitamin D3, (VITAMIN D-3) 50 mcg (2,000 unit) Oral Capsule Take 1 Capsule by mouth Once a day    HYDROcodone-acetaminophen (NORCO) 5-325 mg Oral Tablet Take 1 Tablet by mouth Every 6 hours as needed for Pain    Ibuprofen (MOTRIN) 800 mg Oral Tablet Take 1 Tablet (800 mg total) by mouth Three times a day as needed for Pain    medroxyPROGESTERone (DEPO-PROVERA) 150 mg/mL IntraMUSCULAR Suspension INJECT 1 ML (150 MG TOTAL) INTRAMUSCULARLY EVERY 3 MONTHS    Methylprednisolone (MEDROL  DOSEPACK) 4 mg Oral Tablets, Dose Pack Take as instructed.    mirtazapine (REMERON) 15 mg Oral Tablet TAKE ONE TABLET BY MOUTH nightly for 30 days    MOTEGRITY 2 mg Oral Tablet     pantoprazole (PROTONIX) 40 mg Oral Tablet, Delayed Release (E.C.) Take 1 Tablet (40 mg total) by mouth Once a day    phenazopyridine (PYRIDIUM) 10 mg/mL Oral Liquid Please take 62mL three times daily prn UTI sx    promethazine-dextromethorphan (PHENERGAN-DM) 6.25-15 mg/5 mL Oral Syrup Take 5 mL by mouth Four times a day as needed for Cough    Syringe with Needle, Disp, (MONOJECT 3CC SYR 25GX1") 3 mL 25 gauge x 1" Syringe Use once a month    tamsulosin (FLOMAX) 0.4 mg Oral Capsule Take 1 Capsule (0.4 mg total) by mouth Every evening after dinner    topiramate (TOPAMAX) 50 mg Oral Tablet Take 50 mg by mouth Every night     Allergies   Allergen Reactions    Sulfamethoxazole-Trimethoprim Hives/ Urticaria      Social History     Substance and Sexual Activity   Alcohol Use Yes    Comment: occasionally     Social History     Tobacco  Use   Smoking Status Never   Smokeless Tobacco Never     Social History     Substance and Sexual Activity   Drug Use Yes    Types: Marijuana    Comment: has medical marijuana card; smokes marijuana  2 times a day       Review of Systems:  Constitutional: Negative for activity change, appetite change, diaphoresis and fatigue.   HENT: Negative for hearing loss, nosebleeds, postnasal drip, rhinorrhea, sinus pain and sore throat.    Eyes: Negative for photophobia, discharge and redness.   Respiratory: Negative for cough, choking, chest tightness, shortness of breath and wheezing.    Cardiovascular: Negative for chest pain, palpitations and leg swelling.   Gastrointestinal: Negative for abdominal distention, constipation, diarrhea, nausea and vomiting.   Genitourinary: Negative for difficulty urinating, dysuria, hematuria and urgency.   Neurological: Negative for dizziness, seizures, light-headedness and numbness.    Psychiatric/Behavioral: Negative for agitation, confusion, dysphoric mood and sleep disturbance.     Physical Exam  Vitals:    01/20/22 1434   Weight: 67.1 kg (148 lb)   Height: 1.6 m (5\' 3" )   BMI: 26.27      Constitutional:       General: She is not in acute distress.     Appearance: Normal appearance. She is not ill-appearing or diaphoretic.   Eyes:      Extraocular Movements: Extraocular movements intact.      Pupils: Pupils are equal, round, and reactive to light.   Skin:     Coloration: Skin is not jaundiced.      Findings: No Rash. No lesion.   Neurological:      Mental Status: She is alert.   Musculoskeletal:      Left Knee Exam   Tenderness   The patient is experiencing no tenderness in the patella  No tenderness in patellar tendon  Tenderness over medialjoint line / MCL  Range of Motion   Extension:  0 abnormal   Flexion:  120 abnormal   Tests   McMurray:  Medial - positive Lateral - negative  Varus: positive and tender opens 2-74mm; Valgus: negative  Lachman:  Anterior - negative      Drawer:  Anterior - negative      Pivot: shift: negative  Patellar apprehension: trace  Other   Erythema: absent  Sensation: normal  Pulse: present  Swelling: mild      Imaging:  MRI confirms the MCL injury.    Assessment:  Assessment/Plan   1. Patellar dislocation, left, initial encounter    2. Sprain of medial collateral ligament of left knee, initial encounter        I will treat this non operatively.  Into a brace.  Weightbearing to tolerance.  I have given her therapy slip when she can begin in 2-3 weeks.  I will see her back in 4-6 weeks and check her progress.  She works as a Programme researcher, broadcasting/film/video at [employer].  We have given her modified duties at this stage.    Plan:  Orders Placed This Encounter    DME - KNEE BRACE         Nursing Notes:   Ilsa Iha, [place]  01/20/22 1446  Signed  Patient was prescribed a Functional Hinged Knee Orthosis for patellar dislocation. The patient is ambulatory, but has weakness and/or deformity  due to a recent injury or surgical procedure on their left knee which requires stabilization from this semi-rigid/rigid orthosis to improve their function.  Knee instability must be documented by examination of the beneficiary with objective description of joint laxity (varus/valgus/instability,anterior/posterior drawer test)         Tarry Kos, MD,   01/20/2022      [name], MD  [address]  [place] [address]

## 2022-01-24 ENCOUNTER — Ambulatory Visit (HOSPITAL_COMMUNITY): Payer: Self-pay | Admitting: ORTHOPAEDIC SURGERY

## 2022-01-24 NOTE — Telephone Encounter (Signed)
-----   Message from Nancy Fetter, [place] sent at 01/24/2022  1:44 PM EDT -----  Regarding: FW: letter needed  ----- Message from Gwyneth Sprout sent at 01/24/2022  1:06 PM EDT -----  Traci [name] pt     Pt calling in stating that she is needing her work note updated.   Stating that it needs to have her working hours and rest restrictions, her lifting restrictions as well as state that she cannot serve   She would like this added to her MyChart  thanks

## 2022-02-07 ENCOUNTER — Encounter (INDEPENDENT_AMBULATORY_CARE_PROVIDER_SITE_OTHER): Payer: MEDICAID | Admitting: Internal Medicine

## 2022-02-08 ENCOUNTER — Encounter (INDEPENDENT_AMBULATORY_CARE_PROVIDER_SITE_OTHER): Payer: MEDICAID | Admitting: Internal Medicine

## 2022-02-09 ENCOUNTER — Encounter (INDEPENDENT_AMBULATORY_CARE_PROVIDER_SITE_OTHER): Payer: MEDICAID | Admitting: Internal Medicine

## 2022-02-10 ENCOUNTER — Encounter (INDEPENDENT_AMBULATORY_CARE_PROVIDER_SITE_OTHER): Payer: Self-pay | Admitting: Internal Medicine

## 2022-02-10 ENCOUNTER — Encounter (INDEPENDENT_AMBULATORY_CARE_PROVIDER_SITE_OTHER): Payer: MEDICAID | Admitting: Internal Medicine

## 2022-02-14 ENCOUNTER — Encounter (INDEPENDENT_AMBULATORY_CARE_PROVIDER_SITE_OTHER): Payer: MEDICAID | Admitting: Internal Medicine

## 2022-02-17 ENCOUNTER — Encounter (INDEPENDENT_AMBULATORY_CARE_PROVIDER_SITE_OTHER): Payer: MEDICAID | Admitting: Internal Medicine

## 2022-02-22 ENCOUNTER — Encounter (INDEPENDENT_AMBULATORY_CARE_PROVIDER_SITE_OTHER): Payer: MEDICAID | Admitting: Internal Medicine

## 2022-03-02 ENCOUNTER — Other Ambulatory Visit: Payer: Self-pay

## 2022-03-02 ENCOUNTER — Encounter (HOSPITAL_COMMUNITY): Payer: Self-pay | Admitting: ORTHOPAEDIC SURGERY

## 2022-03-02 ENCOUNTER — Ambulatory Visit (HOSPITAL_BASED_OUTPATIENT_CLINIC_OR_DEPARTMENT_OTHER)
Admission: RE | Admit: 2022-03-02 | Discharge: 2022-03-02 | Disposition: A | Discharge status: Home / Self Care | Payer: MEDICAID | Source: Ambulatory Visit | Admitting: Radiology

## 2022-03-02 ENCOUNTER — Ambulatory Visit: Payer: MEDICAID | Attending: ORTHOPAEDIC SURGERY | Admitting: ORTHOPAEDIC SURGERY

## 2022-03-02 VITALS — Ht 63.0 in | Wt 148.0 lb

## 2022-03-02 DIAGNOSIS — M25562 Pain in left knee: Secondary | ICD-10-CM | POA: Insufficient documentation

## 2022-03-02 DIAGNOSIS — S8002XA Contusion of left knee, initial encounter: Secondary | ICD-10-CM | POA: Insufficient documentation

## 2022-03-02 DIAGNOSIS — S83005A Unspecified dislocation of left patella, initial encounter: Secondary | ICD-10-CM | POA: Insufficient documentation

## 2022-03-02 DIAGNOSIS — S83412A Sprain of medial collateral ligament of left knee, initial encounter: Secondary | ICD-10-CM | POA: Insufficient documentation

## 2022-03-02 NOTE — Progress Notes (Signed)
[facility]  DEPARTMENT OF ORTHOPAEDICS    Clinic Note       Name: Traci [name]  MRN: [id number]    Patient ID: Traci [name] is an 21 y.o. female.  Chief Complaint:     Chief Complaint   Patient presents with    Knee Pain     Left knee pain  DOI: 02-19-22        HPI/Subjective:    Patient is a very pleasant 21 year old who presents today for evaluation.  She sustained an injury to her left knee on October 28th.  She presents today for evaluation.  She of course works at [employer].  This is a repeat visit.  She remains tender over the anterior aspect of her knee and over the origin insertion of the medial collateral ligament.    She describes the pain as sharp in character.  The pain can be rated at a 3/10.  It is typically improved by rest, ice, activity modifications.  There is a mild amount of post activity pain.  Pain is improved by over-the-counter medications.  Both NSAIDs and Tylenol seem to help.  Pain is made worse by significant or forceful exercise.  Also long periods of use do exacerbate the pain.      Current Outpatient Medications   Medication Sig    acetaminophen (TYLENOL) 500 mg Oral Tablet Take 1 Tablet (500 mg total) by mouth Every 4 hours as needed for Pain    Cholecalciferol, Vitamin D3, (VITAMIN D-3) 50 mcg (2,000 unit) Oral Capsule Take 1 Capsule by mouth Once a day    Ibuprofen (MOTRIN) 800 mg Oral Tablet Take 1 Tablet (800 mg total) by mouth Three times a day as needed for Pain    MOTEGRITY 2 mg Oral Tablet     pantoprazole (PROTONIX) 40 mg Oral Tablet, Delayed Release (E.C.) Take 1 Tablet (40 mg total) by mouth Once a day     Allergies   Allergen Reactions    Sulfamethoxazole-Trimethoprim Hives/ Urticaria      Social History     Substance and Sexual Activity   Alcohol Use Yes    Comment: occasionally     Social History     Tobacco Use   Smoking Status Never   Smokeless Tobacco Never     Social History     Substance and Sexual Activity   Drug Use Yes     Types: Marijuana    Comment: has medical marijuana card; smokes marijuana  2 times a day       Review of Systems:  Constitutional: Negative for activity change, appetite change, diaphoresis and fatigue.   HENT: Negative for hearing loss, nosebleeds, postnasal drip, rhinorrhea, sinus pain and sore throat.    Eyes: Negative for photophobia, discharge and redness.   Respiratory: Negative for cough, choking, chest tightness, shortness of breath and wheezing.    Cardiovascular: Negative for chest pain, palpitations and leg swelling.   Gastrointestinal: Negative for abdominal distention, constipation, diarrhea, nausea and vomiting.   Genitourinary: Negative for difficulty urinating, dysuria, hematuria and urgency.   Neurological: Negative for dizziness, seizures, light-headedness and numbness.   Psychiatric/Behavioral: Negative for agitation, confusion, dysphoric mood and sleep disturbance.     Physical Exam  Vitals:    03/02/22 0849   Weight: 67.1 kg (148 lb)   Height: 1.6 m (5\' 3" )   BMI: 26.27      Constitutional:       General: She is  not in acute distress.     Appearance: Normal appearance. She is not ill-appearing or diaphoretic.   Eyes:      Extraocular Movements: Extraocular movements intact.      Pupils: Pupils are equal, round, and reactive to light.   Skin:     Coloration: Skin is not jaundiced.      Findings: No Rash. No lesion.   Neurological:      Mental Status: She is alert.   Musculoskeletal:      Knee continues to improve.  Range of motion 0-130.  Tender over the patellofemoral joint medially and over the origin insertion of the MCL.  There is no longer any opening.    Imaging:  Images reviewed    Assessment:  Assessment/Plan   1. Patellar dislocation, left, initial encounter    2. Sprain of medial collateral ligament of left knee, initial encounter    3. Contusion of left knee, initial encounter    4. Left knee pain        We repeated x-rays today.  I think it is absolutely reasonable to continue with  therapy progressive activity over the next 4-6 weeks.  My suspicion she will get back to baseline function without the need for intervention.      Arnell Sieving, MD,   03/02/2022      Peter Minium, MD  [address]  [place] [address]

## 2022-03-03 ENCOUNTER — Ambulatory Visit (HOSPITAL_COMMUNITY): Payer: Self-pay | Admitting: ORTHOPAEDIC SURGERY

## 2022-03-04 DIAGNOSIS — M25562 Pain in left knee: Secondary | ICD-10-CM

## 2022-03-16 ENCOUNTER — Other Ambulatory Visit: Payer: Self-pay

## 2022-03-16 ENCOUNTER — Encounter (HOSPITAL_COMMUNITY): Payer: Self-pay | Admitting: ORTHOPAEDIC SURGERY

## 2022-03-16 ENCOUNTER — Ambulatory Visit: Payer: MEDICAID | Attending: ORTHOPAEDIC SURGERY | Admitting: ORTHOPAEDIC SURGERY

## 2022-03-16 VITALS — Ht 63.0 in | Wt 148.0 lb

## 2022-03-16 DIAGNOSIS — S83005A Unspecified dislocation of left patella, initial encounter: Secondary | ICD-10-CM | POA: Insufficient documentation

## 2022-03-16 DIAGNOSIS — F1290 Cannabis use, unspecified, uncomplicated: Secondary | ICD-10-CM | POA: Insufficient documentation

## 2022-03-16 DIAGNOSIS — Z79899 Other long term (current) drug therapy: Secondary | ICD-10-CM | POA: Insufficient documentation

## 2022-03-16 DIAGNOSIS — S83412A Sprain of medial collateral ligament of left knee, initial encounter: Secondary | ICD-10-CM | POA: Insufficient documentation

## 2022-03-16 DIAGNOSIS — W19XXXA Unspecified fall, initial encounter: Secondary | ICD-10-CM | POA: Insufficient documentation

## 2022-03-16 NOTE — Progress Notes (Signed)
[facility]  DEPARTMENT OF ORTHOPAEDICS    Clinic Note       Name: SYMANTHA [name]  MRN: [id number]    Patient ID: Traci [name] is an 21 y.o. female.  Chief Complaint:     Chief Complaint   Patient presents with    Re-eval     Patellar dislocation, left, initial encounter        HPI/Subjective:    Patient is a pleasant 21 year old that fell in August and landed on her patella.  She had a patellar dislocation at that time.  She is here for re-evaluation.  She is been in a brace.  She has attended therapy.  She still has some pain.  She feels like the MCL has improved.  She still has crepitation in the anterior aspect of her knee.  She still has pretty significant quad atrophy.    She describes the pain as sharp in character.  The pain can be rated at a 3/10.  It is typically improved by rest, ice, activity modifications.  There is a mild amount of post activity pain.  Pain is improved by over-the-counter medications.  Both NSAIDs and Tylenol seem to help.  Pain is made worse by significant or forceful exercise.  Also long periods of use do exacerbate the pain.      Current Outpatient Medications   Medication Sig    acetaminophen (TYLENOL) 500 mg Oral Tablet Take 1 Tablet (500 mg total) by mouth Every 4 hours as needed for Pain    Cholecalciferol, Vitamin D3, (VITAMIN D-3) 50 mcg (2,000 unit) Oral Capsule Take 1 Capsule by mouth Once a day    Ibuprofen (MOTRIN) 800 mg Oral Tablet Take 1 Tablet (800 mg total) by mouth Three times a day as needed for Pain    MOTEGRITY 2 mg Oral Tablet     pantoprazole (PROTONIX) 40 mg Oral Tablet, Delayed Release (E.C.) Take 1 Tablet (40 mg total) by mouth Once a day     Allergies   Allergen Reactions    Sulfamethoxazole-Trimethoprim Hives/ Urticaria      Social History     Substance and Sexual Activity   Alcohol Use Yes    Comment: occasionally     Social History     Tobacco Use   Smoking Status Never   Smokeless Tobacco Never     Social History      Substance and Sexual Activity   Drug Use Yes    Types: Marijuana    Comment: has medical marijuana card; smokes marijuana  2 times a day       Review of Systems:  Constitutional: Negative for activity change, appetite change, diaphoresis and fatigue.   HENT: Negative for hearing loss, nosebleeds, postnasal drip, rhinorrhea, sinus pain and sore throat.    Eyes: Negative for photophobia, discharge and redness.   Respiratory: Negative for cough, choking, chest tightness, shortness of breath and wheezing.    Cardiovascular: Negative for chest pain, palpitations and leg swelling.   Gastrointestinal: Negative for abdominal distention, constipation, diarrhea, nausea and vomiting.   Genitourinary: Negative for difficulty urinating, dysuria, hematuria and urgency.   Neurological: Negative for dizziness, seizures, light-headedness and numbness.   Psychiatric/Behavioral: Negative for agitation, confusion, dysphoric mood and sleep disturbance.     Physical Exam  Vitals:    03/16/22 0924   Weight: 67.1 kg (148 lb)   Height: 1.6 m (5\' 3" )   BMI: 26.27      Constitutional:  General: She is not in acute distress.     Appearance: Normal appearance. She is not ill-appearing or diaphoretic.   Eyes:      Extraocular Movements: Extraocular movements intact.      Pupils: Pupils are equal, round, and reactive to light.   Skin:     Coloration: Skin is not jaundiced.      Findings: No Rash. No lesion.   Neurological:      Mental Status: She is alert.   Musculoskeletal:      Knee range of motion 0-130.  No longer tender over the origin or insertion of the MCL.  The patella is tender.  She has positive apprehension.  She is tender along the medial retinaculum and tender with a grind test.  She does have apprehension.  I can not sublux her patella.    Imaging:  An MRI shows the patella to be centrally located.  There is edema on both sides patella and on its facet and on the trochlea.    Assessment:  Assessment/Plan   1. Patellar  dislocation, left, initial encounter    2. Sprain of medial collateral ligament of left knee, initial encounter        I think she did have a contusion to her patella.  Hopefully that continues to improve over 2-3 months.  We talked briefly about surgery today but she is going to see how she progresses before making a decision.    If surgery I would recommend a distal realignment procedure which would be able to shift the patella away from the tender area.      Arnell Sieving, MD,   03/16/2022      Peter Minium, MD  [address]  [place] [address]

## 2022-03-16 NOTE — Addendum Note (Signed)
Addended by: Ilsa Iha on: 03/16/2022 10:15 AM     Modules accepted: Orders

## 2022-03-16 NOTE — Nursing Note (Deleted)
Patient was prescribed a Functional Hinged Knee Orthosis for patellar dislocation and mcl sprain. The patient is ambulatory, but has weakness and/or deformity due to a recent injury or surgical procedure on their left knee which requires stabilization from this semi-rigid/rigid orthosis to improve their function.     Knee instability must be documented by examination of the beneficiary with objective description of joint laxity (varus/valgus/instability,anterior/posterior drawer test)

## 2022-04-28 ENCOUNTER — Ambulatory Visit (HOSPITAL_BASED_OUTPATIENT_CLINIC_OR_DEPARTMENT_OTHER)
Admission: RE | Admit: 2022-04-28 | Discharge: 2022-04-28 | Disposition: A | Discharge status: Home / Self Care | Payer: MEDICAID | Source: Ambulatory Visit | Attending: Student in an Organized Health Care Education/Training Program | Admitting: Student in an Organized Health Care Education/Training Program

## 2022-04-28 ENCOUNTER — Other Ambulatory Visit: Payer: Self-pay

## 2022-04-28 ENCOUNTER — Encounter (HOSPITAL_COMMUNITY): Payer: Self-pay | Admitting: Medical

## 2022-04-28 ENCOUNTER — Ambulatory Visit (HOSPITAL_COMMUNITY): Payer: Self-pay | Admitting: ORTHOPAEDIC SURGERY

## 2022-04-28 ENCOUNTER — Ambulatory Visit (HOSPITAL_BASED_OUTPATIENT_CLINIC_OR_DEPARTMENT_OTHER): Payer: MEDICAID | Admitting: Medical

## 2022-04-28 ENCOUNTER — Ambulatory Visit (HOSPITAL_BASED_OUTPATIENT_CLINIC_OR_DEPARTMENT_OTHER)
Admission: RE | Admit: 2022-04-28 | Discharge: 2022-04-28 | Disposition: A | Discharge status: Home / Self Care | Payer: MEDICAID | Source: Ambulatory Visit | Admitting: Radiology

## 2022-04-28 ENCOUNTER — Emergency Department (HOSPITAL_COMMUNITY): Payer: MEDICAID

## 2022-04-28 ENCOUNTER — Emergency Department
Admission: EM | Admit: 2022-04-28 | Discharge: 2022-04-28 | Disposition: A | Discharge status: Home / Self Care | Payer: MEDICAID | Attending: Student in an Organized Health Care Education/Training Program | Admitting: Student in an Organized Health Care Education/Training Program

## 2022-04-28 ENCOUNTER — Encounter (HOSPITAL_COMMUNITY): Payer: Self-pay | Admitting: Student in an Organized Health Care Education/Training Program

## 2022-04-28 VITALS — Ht 62.0 in | Wt 164.0 lb

## 2022-04-28 DIAGNOSIS — R0602 Shortness of breath: Secondary | ICD-10-CM

## 2022-04-28 DIAGNOSIS — S83412D Sprain of medial collateral ligament of left knee, subsequent encounter: Secondary | ICD-10-CM

## 2022-04-28 DIAGNOSIS — S83412A Sprain of medial collateral ligament of left knee, initial encounter: Secondary | ICD-10-CM

## 2022-04-28 DIAGNOSIS — R002 Palpitations: Secondary | ICD-10-CM | POA: Insufficient documentation

## 2022-04-28 DIAGNOSIS — G90529 Complex regional pain syndrome I of unspecified lower limb: Secondary | ICD-10-CM

## 2022-04-28 DIAGNOSIS — S83005D Unspecified dislocation of left patella, subsequent encounter: Secondary | ICD-10-CM

## 2022-04-28 DIAGNOSIS — I498 Other specified cardiac arrhythmias: Secondary | ICD-10-CM | POA: Insufficient documentation

## 2022-04-28 DIAGNOSIS — S8002XA Contusion of left knee, initial encounter: Secondary | ICD-10-CM

## 2022-04-28 DIAGNOSIS — S83005A Unspecified dislocation of left patella, initial encounter: Secondary | ICD-10-CM

## 2022-04-28 DIAGNOSIS — Z3200 Encounter for pregnancy test, result unknown: Secondary | ICD-10-CM | POA: Insufficient documentation

## 2022-04-28 DIAGNOSIS — R110 Nausea: Secondary | ICD-10-CM | POA: Insufficient documentation

## 2022-04-28 DIAGNOSIS — R079 Chest pain, unspecified: Secondary | ICD-10-CM

## 2022-04-28 DIAGNOSIS — M222X2 Patellofemoral disorders, left knee: Secondary | ICD-10-CM

## 2022-04-28 DIAGNOSIS — S8002XD Contusion of left knee, subsequent encounter: Secondary | ICD-10-CM

## 2022-04-28 DIAGNOSIS — M222X9 Patellofemoral disorders, unspecified knee: Secondary | ICD-10-CM | POA: Insufficient documentation

## 2022-04-28 LAB — COMPREHENSIVE METABOLIC PANEL, NON-FASTING
ALBUMIN: 4.5 g/dL (ref 3.5–5.0)
ALKALINE PHOSPHATASE: 56 U/L (ref 40–110)
ALT (SGPT): 9 U/L (ref 8–22)
ANION GAP: 9 mmol/L (ref 4–13)
AST (SGOT): 20 U/L (ref 8–45)
BILIRUBIN TOTAL: 0.5 mg/dL (ref 0.3–1.3)
BUN/CREA RATIO: 9 (ref 6–22)
BUN: 7 mg/dL — ABNORMAL LOW (ref 8–25)
CALCIUM: 9.6 mg/dL (ref 8.6–10.2)
CHLORIDE: 110 mmol/L (ref 96–111)
CO2 TOTAL: 20 mmol/L — ABNORMAL LOW (ref 22–30)
CREATININE: 0.76 mg/dL (ref 0.60–1.05)
ESTIMATED GFR - FEMALE: >90 mL/min/BSA (ref >=60)
GLUCOSE: 86 mg/dL (ref 65–125)
POTASSIUM: 4 mmol/L (ref 3.5–5.1)
PROTEIN TOTAL: 8 g/dL (ref 6.4–8.3)
SODIUM: 139 mmol/L (ref 136–145)

## 2022-04-28 LAB — CBC WITH DIFF
BASOPHIL #: <0.1 10*3/uL (ref <=0.20)
BASOPHIL %: 0 %
EOSINOPHIL #: <0.1 10*3/uL (ref <=0.50)
EOSINOPHIL %: 0 %
HCT: 37.8 % (ref 34.8–46.0)
HGB: 13.5 g/dL (ref 11.5–16.0)
IMMATURE GRANULOCYTE #: <0.1 10*3/uL (ref <0.10)
IMMATURE GRANULOCYTE %: 0 % (ref 0.0–1.0)
LYMPHOCYTE #: 2.57 10*3/uL (ref 1.00–4.80)
LYMPHOCYTE %: 26 %
MCH: 29.9 pg (ref 26.0–32.0)
MCHC: 35.7 g/dL — ABNORMAL HIGH (ref 31.0–35.5)
MCV: 83.6 fL (ref 78.0–100.0)
MONOCYTE #: 0.51 10*3/uL (ref 0.20–1.10)
MONOCYTE %: 5 %
MPV: 11.6 fL (ref 8.7–12.5)
NEUTROPHIL #: 6.7 10*3/uL (ref 1.50–7.70)
NEUTROPHIL %: 69 %
PLATELETS: 281 10*3/uL (ref 150–400)
RBC: 4.52 10*6/uL (ref 3.85–5.22)
RDW-CV: 12.5 % (ref 11.5–15.5)
WBC: 9.9 10*3/uL (ref 3.7–11.0)

## 2022-04-28 LAB — ECG 12 LEAD
Atrial Rate: 66 {beats}/min
Calculated P Axis: 12 degrees
Calculated R Axis: 68 degrees
Calculated T Axis: 17 degrees
PR Interval: 144 ms
QRS Duration: 88 ms
QT Interval: 402 ms
QTC Calculation: 421 ms
Ventricular rate: 66 {beats}/min

## 2022-04-28 LAB — HCG, SERUM QUALITATIVE, PREGNANCY: PREGNANCY, SERUM QUALITATIVE: NEGATIVE

## 2022-04-28 LAB — RED TOP TUBE

## 2022-04-28 LAB — MAGNESIUM: MAGNESIUM: 2 mg/dL (ref 1.8–2.6)

## 2022-04-28 LAB — POC BLOOD GLUCOSE (RESULTS): GLUCOSE, POC: 90 mg/dl (ref 70–100)

## 2022-04-28 LAB — TROPONIN-I: TROPONIN-I HS: <2.7 ng/L (ref <=14.0)

## 2022-04-28 LAB — THYROID STIMULATING HORMONE WITH FREE T4 REFLEX: TSH: 4.061 u[IU]/mL (ref 0.350–4.940)

## 2022-04-28 LAB — PHOSPHORUS: PHOSPHORUS: 3.3 mg/dL (ref 2.4–4.7)

## 2022-04-28 LAB — BLUE TOP TUBE

## 2022-04-28 MED ORDER — SODIUM CHLORIDE 0.9 % IV BOLUS
1000.0000 mL | INJECTION | Status: AC
Start: 2022-04-28 — End: 2022-04-28
  Administered 2022-04-28: 0 mL via INTRAVENOUS
  Administered 2022-04-28: 1000 mL via INTRAVENOUS

## 2022-04-28 MED ORDER — ONDANSETRON HCL (PF) 4 MG/2 ML INJECTION SOLUTION
4.0000 mg | INTRAMUSCULAR | Status: AC
Start: 2022-04-28 — End: 2022-04-28
  Administered 2022-04-28: 4 mg via INTRAVENOUS
  Filled 2022-04-28: qty 2

## 2022-04-28 NOTE — Progress Notes (Signed)
[facility] Note       Name: Traci [name]  MRN: [id number]    Patient ID: Traci [name] is an 22 y.o. female.  Chief Complaint:     Chief Complaint   Patient presents with    Re-eval     Lt knee pain, no progress         HPI/Subjective:    Patient is a very pleasant 22 year old who presents today for evaluation.  She has had a patellofemoral pain on her left side.  We have seen her in the past and performed an MRI.  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  Over the interval her MCL has healed and is no longer tender.  She is still quite tender over her patellofemoral joint.  Her question to me is whether she would benefit from a distal realignment procedure or an osteotomy of her tibia.    She describes the pain as sharp in character.  The pain can be rated at a 3/10.  It is typically improved by rest, ice, activity modifications.  There is a mild amount of post activity pain.  Pain is improved by over-the-counter medications.  Both NSAIDs and Tylenol seem to help.  Pain is made worse by significant or forceful exercise.  Also long periods of use do exacerbate the pain.      Current Outpatient Medications   Medication Sig    acetaminophen (TYLENOL) 500 mg Oral Tablet Take 1 Tablet (500 mg total) by mouth Every 4 hours as needed for Pain    Cholecalciferol, Vitamin D3, (VITAMIN D-3) 50 mcg (2,000 unit) Oral Capsule Take 1 Capsule by mouth Once a day    Ibuprofen (MOTRIN) 800 mg Oral Tablet Take 1 Tablet (800 mg total) by mouth Three times a day as needed for Pain    MOTEGRITY 2 mg Oral Tablet     pantoprazole (PROTONIX) 40 mg Oral Tablet, Delayed Release (E.C.) Take 1 Tablet (40 mg total) by mouth Once a day     Allergies   Allergen Reactions    Sulfamethoxazole-Trimethoprim Hives/ Urticaria      Social History     Substance and Sexual Activity   Alcohol Use Yes    Comment: occasionally     Social History     Tobacco  Use   Smoking Status Never   Smokeless Tobacco Never     Social History     Substance and Sexual Activity   Drug Use Yes    Types: Marijuana    Comment: has medical marijuana card; smokes marijuana  2 times a day       Review of Systems:  Constitutional: Negative for activity change, appetite change, diaphoresis and fatigue.   HENT: Negative for hearing loss, nosebleeds, postnasal drip, rhinorrhea, sinus pain and sore throat.    Eyes: Negative for photophobia, discharge and redness.   Respiratory: Negative for cough, choking, chest tightness, shortness of breath and wheezing.    Cardiovascular: Negative for chest pain, palpitations and leg swelling.   Gastrointestinal: Negative for abdominal distention, constipation, diarrhea, nausea and vomiting.   Genitourinary: Negative for difficulty urinating, dysuria, hematuria and urgency.   Neurological: Negative for dizziness, seizures, light-headedness and numbness.   Psychiatric/Behavioral: Negative for agitation, confusion, dysphoric mood and sleep disturbance.     Physical Exam  Vitals:    04/28/22 1303   Weight: 74.4 kg (164 lb)  Height: 1.575 m (5\' 2" )   BMI: 30.06      Constitutional:       General: She is not in acute distress.     Appearance: Normal appearance. She is not ill-appearing or diaphoretic.   Eyes:      Extraocular Movements: Extraocular movements intact.      Pupils: Pupils are equal, round, and reactive to light.   Skin:     Coloration: Skin is not jaundiced.      Findings: No Rash. No lesion.   Neurological:      Mental Status: She is alert.   Musculoskeletal:      She hold her leg in an animated position.  She is exquisitely tender and has very significant pain with any palpation or motion of her patella.  The degree to which she has pain with this is reminiscent of complex regional pain syndrome.  She does hold her leg in an animated position and has subtle dysphoric changes of her skin.    Imaging:  MRI reviewed at  length    Assessment:  Assessment/Plan   1. Patellar dislocation, left, initial encounter    2. Sprain of medial collateral ligament of left knee, initial encounter    3. Contusion of left knee, initial encounter    4. Patellofemoral maltracking        The MCL injury is healed.  As a consequence I do not think that she requires additional treatment for that.  The patellofemoral joint remains symptomatic but the degree of chronic pain that she has we will make it difficult take her through an osteotomy or a patellectomy and get her a reproducible good result.    As a consequence my 1st thought is to refer her to our chronic pain team have them work her up for possible RSD/CRPS.    If that is the case perhaps sympathetic blocks would help her.    If nothing helps him at the end of the day she may require consideration for either a tibial tubercle osteotomy or patellectomy.    This was a 30 minute visit.  During the visit we discussed the patient's situation, performed history and physical examination and discuss treatment alternatives including surgical and nonsurgical.  This time included review of historical data, clinical time with patient, documentation and review of pre visit activities and recommendations for post visit activities. After the discussion all questions were answered.  Patient understood the issues.  Time is exclusive of procedures.  Follow-up was scheduled.        Tarry Kos, MD,   04/28/2022      [name], MD  [place] [address]       There are no exam notes on file for this visit.

## 2022-04-28 NOTE — ED Nurses Note (Signed)
Patient discharged home with family.  AVS reviewed with patient/care giver.  A written copy of the AVS and discharge instructions was given to the patient/care giver.  Questions sufficiently answered as needed.  Patient/care giver encouraged to follow up with PCP as indicated.  In the event of an emergency, patient/care giver instructed to call 911 or go to the nearest emergency room.

## 2022-04-28 NOTE — Discharge Instructions (Addendum)
Please follow up closely with your primary care doctor as discussed.  I am giving you the number for central scheduling which he will call tomorrow to schedule your Holter monitor placement    Central scheduling: [phone]    If you develop any new or concerning symptoms such as significant shortness of breath, chest pain or other new/concerning symptoms please return to the ER for further evaluation

## 2022-04-28 NOTE — ED Provider Notes (Signed)
[facility]  ED Primary Provider Note  History of Present Illness   Chief Complaint   Patient presents with    Chest Pain     Shortness of Breath     Traci [name] is a 22 y.o. female who had concerns including Chest Pain  and Shortness of Breath.  Arrival: The patient arrived by Car    Presenting with concerns for palpitations, nausea.  Patient reports approximately 2 hours ago she began having heart palpitations, felt as though the bottom half of her heart beating faster than the top half of her heart.  Has associated shortness of breath and nausea.  No associated chest pain, fevers, chills, lower extremity pain/swelling, abdominal pain, vomiting, urinary changes or bowel changes.  Denies any recent medication change or alcohol use Regularly uses marijuana, has a medical marijuana card in gets it from a dispensary.  No history of CAD/MI.  No history of DVT/PE.      Patient also reports she was recently been trying to lose weight and has only been eating 800 to a 1000 calories a day.    Past medical history significant for hypothyroidism        History Reviewed This Encounter: Medical History  Surgical History  Family History  Social History    Physical Exam   ED Triage Vitals [04/28/22 0128]   BP (Non-Invasive) (!) 151/89   Heart Rate 71   Respiratory Rate 17   Temperature 36.3 C (97.3 F)   SpO2 100 %   Weight 74.8 kg (164 lb 14.5 oz)   Height 1.575 m (5\' 2" )     Physical Exam  Vitals and nursing note reviewed.   Constitutional:       General: She is not in acute distress.     Appearance: She is well-developed.   HENT:      Head: Normocephalic and atraumatic.      Mouth/Throat:      Pharynx: Oropharynx is clear.   Eyes:      Conjunctiva/sclera: Conjunctivae normal.   Cardiovascular:      Rate and Rhythm: Normal rate and regular rhythm.   Pulmonary:      Effort: Pulmonary effort is normal. No respiratory distress.      Breath sounds: Normal breath sounds.   Abdominal:      Palpations:  Abdomen is soft.      Tenderness: There is no abdominal tenderness.   Musculoskeletal:      Cervical back: Neck supple.      Right lower leg: No edema.      Left lower leg: No edema.   Skin:     General: Skin is warm and dry.      Capillary Refill: Capillary refill takes less than 2 seconds.   Neurological:      Mental Status: She is alert and oriented to person, place, and time.   Psychiatric:         Mood and Affect: Mood normal.       Patient Data     Labs Ordered/Reviewed   COMPREHENSIVE METABOLIC PANEL, NON-FASTING - Abnormal; Notable for the following components:       Result Value    CO2 TOTAL 20 (*)     BUN 7 (*)     All other components within normal limits   CBC WITH DIFF - Abnormal; Notable for the following components:    MCHC 35.7 (*)     All other components within normal limits  MAGNESIUM - Normal   TROPONIN-I - Normal   THYROID STIMULATING HORMONE WITH FREE T4 REFLEX - Normal   PHOSPHORUS - Normal   HCG, SERUM QUALITATIVE, PREGNANCY - Normal   POC BLOOD GLUCOSE (RESULTS) - Normal   CBC/DIFF    Narrative:     The following orders were created for panel order CBC/DIFF.  Procedure                               Abnormality         Status                     ---------                               -----------         ------                     CBC WITH FYBO[[id number]]                Abnormal            Final result                 Please view results for these tests on the individual orders.   EXTRA TUBES    Narrative:     The following orders were created for panel order EXTRA TUBES.  Procedure                               Abnormality         Status                     ---------                               -----------         ------                     BLUE TOP IDPO[[id number]]                                    In process                 RED TOP ERXV[[id number]]                                     In process                   Please view results for these tests on the individual orders.   BLUE TOP  TUBE   RED TOP TUBE     No orders to display     Medical Decision Making        Medical Decision Making  22 year old female presenting with concerns for heart palpitations. On initial evaluation lying in bed, no acute distress with unremarkable vitals and exam as above     Differential diagnosis including but not limited to ACS, arrhythmia, PE, electrolyte abnormality, endocrine abnormality, hypovolemia, anemia, other    Work-up: Given  history and exam presentation most concerning for  etiologies as above. Will plan to evaluate further with chest x-ray, ECG, labs. Ordered IV fluids, IV Zofran for initial symptomatic management.  Patient is low risk Wells, PERC negative.  I do not feel additional evaluation for PE is indicated at this time.    Please see below for ED course, results and disposition.         Amount and/or Complexity of Data Reviewed  Labs: ordered. Decision-making details documented in ED Course.  Radiology: ordered and independent interpretation performed. Decision-making details documented in ED Course.  ECG/medicine tests: ordered and independent interpretation performed. Decision-making details documented in ED Course.    Risk  Prescription drug management.      ED Course as of 04/28/22 0250   Thu Apr 28, 2022   0155 ECG independently reviewed) showing normal sinus rhythm with a rate of 60 and occasional sinus arrhythmia.  Has nonspecific T-wave changes in lead 3.  Otherwise no acute ST segment or T-wave change. Normal axis, normal intervals    0200 WBC: 9.9   0200 HGB: 13.5   0200 GLUCOSE, POC: 90   0216 PREGNANCY, SERUM QUALITATIVE: Negative   0222 MAGNESIUM: 2.0   0222 PHOSPHORUS: 3.3   0222 SODIUM: 139   0222 POTASSIUM: 4.0   0222 BUN(!): 7   0222 CREATININE: 0.76   0239 Chest x-ray without acute cardiopulmonary abnormality   0249 Discussed results of workup with the patient at bedside.  Counseled regarding need for close primary care follow up and strict return to ER precautions.  Placed  patient in order for outpatient monitor placement provided phone number to call.  Patient expressed understanding was discharged in stable condition         Medications Administered in the ED   NS bolus infusion 1,000 mL (0 mL Intravenous Stopped 04/28/22 0237)   ondansetron (ZOFRAN) 2 mg/mL injection (4 mg Intravenous Given 04/28/22 0206)     Clinical Impression   Palpitations (Primary)       Disposition: Discharged

## 2022-04-28 NOTE — ED Triage Notes (Signed)
Pt p/w c/o chest palpitations, shortness of breath, dizziness, & nausea that started ~2hrs ago.

## 2022-04-29 DIAGNOSIS — M25562 Pain in left knee: Secondary | ICD-10-CM

## 2022-04-30 ENCOUNTER — Encounter (HOSPITAL_COMMUNITY): Payer: Self-pay | Admitting: Medical

## 2022-05-02 ENCOUNTER — Encounter (HOSPITAL_COMMUNITY): Payer: Self-pay | Admitting: ORTHOPAEDIC SURGERY

## 2022-05-02 ENCOUNTER — Encounter (INDEPENDENT_AMBULATORY_CARE_PROVIDER_SITE_OTHER): Payer: Self-pay | Admitting: Family

## 2022-05-02 ENCOUNTER — Ambulatory Visit (INDEPENDENT_AMBULATORY_CARE_PROVIDER_SITE_OTHER): Payer: MEDICAID | Admitting: Family

## 2022-05-02 ENCOUNTER — Other Ambulatory Visit: Payer: Self-pay

## 2022-05-02 VITALS — BP 140/90 | HR 94 | Temp 97.1°F | Resp 18 | Ht 62.0 in | Wt 161.0 lb

## 2022-05-02 DIAGNOSIS — M545 Low back pain, unspecified: Secondary | ICD-10-CM

## 2022-05-02 DIAGNOSIS — R002 Palpitations: Secondary | ICD-10-CM

## 2022-05-02 DIAGNOSIS — R0602 Shortness of breath: Secondary | ICD-10-CM

## 2022-05-02 MED ORDER — DICLOFENAC 1 % TOPICAL GEL
Freq: Four times a day (QID) | CUTANEOUS | 1 refills | Status: AC
Start: 2022-05-02 — End: 2022-05-12

## 2022-05-02 NOTE — Nursing Note (Signed)
Pt is here fr Ed follow up. Pt states she is still having SOB.

## 2022-05-02 NOTE — Progress Notes (Signed)
[place]  PRIMARY CARE, [facility]  [place] [address]  [phone]    CHAELI JUDY is an 22 y.o. female who is here for an er follow up.     Subjective:    The patient presents today with an ER follow-up.  She presented to the [facility] Emergency Department on 04/28/2022 with complaints heart palpitations and shortness of breath.  In the emergency department labs including CBC, CMP, magnesium, troponin, TSH, phosphorus and HCG were within normal limits.  EKG was completed and showed normal sinus rhythm with sinus arrhythmia.  Chest x-ray showed no acute abnormality.  Patient was deemed stable and was discharged home later that night.  A 14 day Holter monitor was ordered.  She is currently wearing her Holter monitor.      Today she presents and tells me that she is still having some SOB and palpitations.  She reports that they are no better or no worse.  Reports that she is having low back pain.  She reports numbness and tingling in bilateral legs.  Denies any loss of bowel or bladder control.  Reports no injuries.  She is not taking anything for her back pain.    Allergies   Allergen Reactions    Sulfamethoxazole-Trimethoprim Hives/ Urticaria       Current Outpatient Medications:     acetaminophen (TYLENOL) 500 mg Oral Tablet, Take 1 Tablet (500 mg total) by mouth Every 4 hours as needed for Pain (Patient not taking: Reported on 05/02/2022), Disp: , Rfl:     Cholecalciferol, Vitamin D3, (VITAMIN D-3) 50 mcg (2,000 unit) Oral Capsule, Take 1 Capsule by mouth Once a day, Disp: 100 Capsule, Rfl: 3    Ibuprofen (MOTRIN) 800 mg Oral Tablet, Take 1 Tablet (800 mg total) by mouth Three times a day as needed for Pain (Patient not taking: Reported on 05/02/2022), Disp: , Rfl:     MOTEGRITY 2 mg Oral Tablet, , Disp: , Rfl:     pantoprazole (PROTONIX) 40 mg Oral Tablet, Delayed Release (E.C.), Take 1 Tablet (40 mg total) by mouth Once a day (Patient not  taking: Reported on 05/02/2022), Disp: , Rfl:   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    PONV (postoperative nausea and vomiting)     Smoker     vapes with nicotine    UTI (urinary tract infection)     Vitamin D deficiency          Past Surgical History:   Procedure Laterality Date    FOOT SURGERY Right     HX CHOLECYSTECTOMY      HX DENTAL EXTRACTION      HX FOOT SURGERY      HX  OTHER  10/30/2015    DIAG LAPARO SEPARATE PROC - fulgration of endometriotic implants    HX OTHER      ORIF Right foot    HX TONSIL AND ADENOIDECTOMY      HX TONSILLECTOMY      LAPAROSCOPIC ENDOMETRIOSIS FULGURATION      LAPAROSCOPIC ENDOMETRIOSIS FULGURATION      2nd surgery was to also drain a blood clot.         Social History     Socioeconomic History    Marital status: Single     Spouse name: Not on file    Number of children: Not on file    Years of education: Not on file    Highest education level: Not on file   Occupational History    Not on file   Tobacco Use    Smoking status: Never    Smokeless tobacco: Never   Vaping Use    Vaping Use: Every day    Substances: Nicotine    Devices: Pre-filled pod   Substance and Sexual Activity    Alcohol use: Yes     Comment: occasionally    Drug use: Yes     Types: Marijuana     Comment: has medical marijuana card; smokes marijuana  2 times a day    Sexual activity: Yes     Birth control/protection: Condom   Other Topics Concern    Ability to Walk 1 Flight of Steps without SOB/CP Not Asked    Routine Exercise Not Asked     Ability to Walk 2 Flight of Steps without SOB/CP Not Asked    Unable to Ambulate Not Asked    Total Care Not Asked    Ability To Do Own ADL's Not Asked    Uses Walker Not Asked    Other Activity Level Not Asked    Uses Cane Not Asked   Social History Narrative    Not on file     Social Determinants of Health     Financial Resource Strain: Low Risk  (12/17/2021)    Financial Resource Strain     SDOH Financial: No   Transportation Needs: Low Risk  (12/17/2021)    Transportation Needs     SDOH Transportation: No   Social Connections: Medium Risk (12/17/2021)    Social Connections     SDOH Social Isolation: 3 to 5 times a week   Intimate Partner Violence: Low Risk  (12/17/2021)    Intimate Partner Violence     SDOH Domestic Violence: No   Housing Stability: Low Risk  (12/17/2021)    Housing Stability     SDOH Housing Situation: I have housing.     SDOH Housing Worry: No         ROS:  Constitutional: Denies any fever or chills.   ENT: Denies any nasal congestion, sore throat or hearing loss.   Respiratory: Denies any Cough.  SOB with exertion at times.  Cardiovascular: Denies any Chest pain or edema.  Reports palpitations.   Gastrointestinal: Denies any abd pain, N/V/D  Genitourinary: Denies any dysuria or hematuria  Musculoskeletal: Denies any joint pain or weakness  Neurologic: Denies any memory loss or confusion  Integumentary : Denies any rashes           Objective:    Physical Exam:  BP (!) 140/90 (Site: Right, Patient Position: Sitting, Cuff Size: Adult Large)   Pulse 94   Temp 36.2 C (97.1 F)   Resp 18  Ht 1.575 m (5\' 2" )   Wt 73 kg (161 lb)   LMP  (LMP Unknown)   SpO2 98%   BMI 29.45 kg/m     General: Patient is alert and oriented and in no distress   Head: normocephalic, atraumatic  Eye: EOM intact, normal conjunctiva, PERRLA  Abdomen: Soft and non tender,  +BS in all four quadrants, no organomegaly  Respiratory: Lung fields clear bilaterally equal chest rise and expansion   Heart: Regular rate and  Rhythm, no edema   Neurologic: Alert and oriented  Skin: No rashes noted   Psychiatric: Patient is cooperative and in no acute distress.  Musculoskeletal:+ for tenderness to lumbar spine.     Assessment and Plan:  (R00.2) Palpitations  (primary encounter diagnosis)  Plan: Will await Holter monitor results.  Further recommendations based on results.  Given shortness breath will obtain PFTs as well.  Should symptoms worsen she should report back to the ER.    (R06.02) SOB (shortness of breath)  Plan: Pulmonary Function Testing - Adult        As above.    (M54.50) Low back pain  Plan: XR LUMBAR SPINE SERIES        Voltaren gel sent to the pharmacy.  Recommended x-rays as ordered.  Further recommendations based on results.    Orders Placed This Encounter    XR LUMBAR SPINE SERIES    Pulmonary Function Testing - Adult    diclofenac sodium (VOLTAREN) 1 % Gel     The patient was instructed to call office or go to the ER with any new or worsening symptoms.           , CRNP

## 2022-05-03 ENCOUNTER — Ambulatory Visit (INDEPENDENT_AMBULATORY_CARE_PROVIDER_SITE_OTHER): Payer: Self-pay | Admitting: Family Medicine

## 2022-05-03 NOTE — Telephone Encounter (Signed)
Spoke with Pt Traci [name] pt states will seek evaluation at the ER.

## 2022-05-03 NOTE — Telephone Encounter (Signed)
From: Milagros Loll  To: Cecille Po  Sent: 04/30/2022 1:56 PM EST  Subject: Work note     Traci [name] ,    I had saw Dr.Pressman Thursday , we had went over a bunch . I'm messaging you on regards of a new doctors note for work , I have to keep the excuses up to date for work .     Thank you ,  Traci [name]

## 2022-05-03 NOTE — Telephone Encounter (Signed)
Regarding: Rx/[place]  ----- Message from Laurann Montana sent at 05/03/2022  9:13 AM EST -----  Doctors Name:Rachel Shona Simpson, MD     Hello!  Medication Requested:something for nausea (Zofran is not effective for the pt)     Preferred [place], [place] [address]    Phone: [phone] Fax: [phone]    Hours: Not open 24 hours        Thanks!  Laurann Montana

## 2022-05-03 NOTE — Telephone Encounter (Signed)
The patient is continuing to experience nausea even after using Zofran and she is unable to keep fluids down would recommend she report to the emergency department for further evaluation.

## 2022-05-04 ENCOUNTER — Ambulatory Visit (HOSPITAL_COMMUNITY): Payer: Self-pay | Admitting: NUCLEAR MEDICINE

## 2022-05-04 ENCOUNTER — Ambulatory Visit (HOSPITAL_COMMUNITY): Payer: Self-pay

## 2022-05-05 ENCOUNTER — Ambulatory Visit (HOSPITAL_COMMUNITY): Payer: Self-pay | Admitting: Anesthesiology

## 2022-05-17 ENCOUNTER — Ambulatory Visit (HOSPITAL_COMMUNITY): Payer: Self-pay

## 2022-05-28 DIAGNOSIS — R002 Palpitations: Secondary | ICD-10-CM

## 2022-05-28 LAB — 14 DAY EXTENDED HOLTER MONITOR
Heart rate (average): 88 {beats}/min
Isolated SVE count: 74 episodes
Isolated VE Counts: 653 episodes
Longest trigeminy - duration: 6.2 s

## 2022-06-01 ENCOUNTER — Ambulatory Visit (INDEPENDENT_AMBULATORY_CARE_PROVIDER_SITE_OTHER): Payer: Self-pay | Admitting: Family Medicine

## 2022-06-01 NOTE — Telephone Encounter (Signed)
Regarding: Traci [name] pt // heart monitor results  ----- Message from Ronni Rumble sent at 06/01/2022 10:01 AM EST -----  [name], MD    Pt calling in requesting a call to discuss recent test results from her heart monitor.    Thanks,  [name]

## 2022-06-01 NOTE — Telephone Encounter (Signed)
Underlying rhythm is normal sinus rhythm with a HR of 88. 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 We will continue to monitor her HR and BP at this time.  Her BP was stable at her last visit at 140/90 and her HR was 94.

## 2022-06-02 ENCOUNTER — Ambulatory Visit (HOSPITAL_COMMUNITY): Payer: Self-pay

## 2022-06-02 NOTE — Telephone Encounter (Signed)
Pt aware per Wells Guiles  Underlying rhythm is normal sinus rhythm with a HR of 88. 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 We will continue to monitor her HR and BP at this time.  Her BP was stable at her last visit at 140/90 and her HR was 94.

## 2022-06-08 ENCOUNTER — Ambulatory Visit (HOSPITAL_COMMUNITY): Payer: Self-pay | Admitting: ORTHOPAEDIC SURGERY

## 2022-06-08 DIAGNOSIS — G90529 Complex regional pain syndrome I of unspecified lower limb: Secondary | ICD-10-CM

## 2022-06-08 DIAGNOSIS — S83412A Sprain of medial collateral ligament of left knee, initial encounter: Secondary | ICD-10-CM

## 2022-06-08 DIAGNOSIS — S83005A Unspecified dislocation of left patella, initial encounter: Secondary | ICD-10-CM

## 2022-06-08 DIAGNOSIS — M222X9 Patellofemoral disorders, unspecified knee: Secondary | ICD-10-CM

## 2022-06-08 DIAGNOSIS — S8002XA Contusion of left knee, initial encounter: Secondary | ICD-10-CM

## 2022-06-09 ENCOUNTER — Ambulatory Visit (INDEPENDENT_AMBULATORY_CARE_PROVIDER_SITE_OTHER): Payer: Self-pay | Admitting: Physician Assistant

## 2022-06-10 ENCOUNTER — Ambulatory Visit (INDEPENDENT_AMBULATORY_CARE_PROVIDER_SITE_OTHER): Payer: Self-pay | Admitting: Physician Assistant

## 2022-06-20 ENCOUNTER — Telehealth (INDEPENDENT_AMBULATORY_CARE_PROVIDER_SITE_OTHER): Payer: Self-pay | Admitting: Physician Assistant

## 2022-06-20 ENCOUNTER — Other Ambulatory Visit: Payer: Self-pay

## 2022-06-20 ENCOUNTER — Telehealth: Payer: MEDICAID | Admitting: Physician Assistant

## 2022-06-20 ENCOUNTER — Telehealth: Payer: MEDICAID | Admitting: PHYSICIAN ASSISTANT

## 2022-06-20 DIAGNOSIS — M549 Dorsalgia, unspecified: Secondary | ICD-10-CM

## 2022-06-20 DIAGNOSIS — B349 Viral infection, unspecified: Secondary | ICD-10-CM

## 2022-06-20 DIAGNOSIS — R062 Wheezing: Secondary | ICD-10-CM

## 2022-06-20 DIAGNOSIS — R059 Cough, unspecified: Secondary | ICD-10-CM

## 2022-06-20 DIAGNOSIS — Z029 Encounter for administrative examinations, unspecified: Secondary | ICD-10-CM

## 2022-06-20 DIAGNOSIS — J3489 Other specified disorders of nose and nasal sinuses: Secondary | ICD-10-CM

## 2022-06-20 DIAGNOSIS — H9209 Otalgia, unspecified ear: Secondary | ICD-10-CM

## 2022-06-20 DIAGNOSIS — R0981 Nasal congestion: Secondary | ICD-10-CM

## 2022-06-20 DIAGNOSIS — M542 Cervicalgia: Secondary | ICD-10-CM

## 2022-06-20 MED ORDER — FLUTICASONE PROPIONATE 50 MCG/ACTUATION NASAL SPRAY,SUSPENSION
1.0000 | Freq: Every day | NASAL | 0 refills | Status: DC
Start: 2022-06-20 — End: 2022-06-20

## 2022-06-20 MED ORDER — ALBUTEROL SULFATE HFA 90 MCG/ACTUATION AEROSOL INHALER
1.0000 | INHALATION_SPRAY | Freq: Four times a day (QID) | RESPIRATORY_TRACT | 0 refills | Status: DC | PRN
Start: 2022-06-20 — End: 2022-10-19

## 2022-06-20 MED ORDER — PROMETHAZINE-DM 6.25 MG-15 MG/5 ML ORAL SYRUP
5.0000 mL | ORAL_SOLUTION | Freq: Four times a day (QID) | ORAL | 0 refills | Status: DC | PRN
Start: 2022-06-20 — End: 2022-10-19

## 2022-06-20 MED ORDER — FLUTICASONE PROPIONATE 50 MCG/ACTUATION NASAL SPRAY,SUSPENSION
1.0000 | Freq: Every day | NASAL | 0 refills | Status: DC
Start: 2022-06-20 — End: 2022-10-19

## 2022-06-20 MED ORDER — PROMETHAZINE-DM 6.25 MG-15 MG/5 ML ORAL SYRUP
5.0000 mL | ORAL_SOLUTION | Freq: Four times a day (QID) | ORAL | 0 refills | Status: DC | PRN
Start: 2022-06-20 — End: 2022-06-20

## 2022-06-20 MED ORDER — ALBUTEROL SULFATE HFA 90 MCG/ACTUATION AEROSOL INHALER
1.0000 | INHALATION_SPRAY | Freq: Four times a day (QID) | RESPIRATORY_TRACT | 0 refills | Status: DC | PRN
Start: 2022-06-20 — End: 2022-06-20

## 2022-06-20 NOTE — Telephone Encounter (Signed)
Changed to paper scripts. Please fax.

## 2022-06-20 NOTE — Progress Notes (Signed)
I personally offered the service to the patient, and obtained verbal consent to provide this service.    Traci Fields, PA-C      PRIMARY CARE, [place]  [place] [address]    Video Visit     Name:  Traci [name] MRN: [id number]   Date:    06/20/2022 Age:  22 y.o.                                      Patient's location: [place] [address]   Patient/family aware of provider location: Yes  Patient/family consent for video visit: Yes  Interview and observation performed by: Traci Fields, PA-C    Chief Complaint: Feel Sick    History of Present Illness:  Traci [name] is a 22 y.o. female who presents via video for an acute visist with c/o feeling sick for 2 days.  She reports symptoms started on Saturday.  Symptoms include ear pain, nasal congestion, sinus pressure, cough, sputum production and wheezing.  She also reports she has been experiencing body aches including back pain and neck pain.  She did not take any at home COVID-19 viral test. Denies fevers, rashes.  She has been taking Sudafed for symptoms.        Past Medical History:  Medical History     Diagnosis Date Comment Source    Anxiety       Chronic nausea       Constipation       Depression       Endometriosis       Foot fracture  Right     Gastroesophageal reflux disease 02/19/2014      HA (headache)       Hashimoto's thyroiditis       Hemorrhage of right ovary 02/19/2015      History of anesthesia complications  takes a while to wake up     Hypothyroidism       Irritable bowel syndrome       Migraine       Obesity       Panic attack       Personal history of venous thrombosis and embolism  in stomach after   surgery     PONV (postoperative nausea and vomiting)       Smoker  vapes with nicotine     UTI (urinary tract infection)       Vitamin D deficiency            Past Surgical History:   Procedure Laterality Date    FOOT SURGERY Right     HX CHOLECYSTECTOMY      HX DENTAL EXTRACTION      HX FOOT SURGERY       HX OTHER  10/30/2015    DIAG LAPARO SEPARATE PROC - fulgration of endometriotic implants    HX OTHER      ORIF Right foot    HX TONSIL AND ADENOIDECTOMY      HX TONSILLECTOMY      LAPAROSCOPIC ENDOMETRIOSIS FULGURATION      LAPAROSCOPIC ENDOMETRIOSIS FULGURATION      2nd surgery was to also drain a blood clot.             Patient Active Problem List   Diagnosis    Anxiety    Endometriosis    Hashimoto's thyroiditis  Iron deficiency    Lisping    Overweight (BMI 25.0-29.9)    Pelvic pain    Vitamin D deficiency    Advice given about COVID-19 virus infection    Postoperative nausea and vomiting    Recurrent syncope    Palpitations    Irregular bleeding       Family Medical History:       Problem Relation (Age of Onset)    Alopecia Sister    COLITIS Brother    Hypertension (High Blood Pressure) Mother    Migraines Mother    No Known Problems Father    Thyroid Disease Mother               Social History     Tobacco Use    Smoking status: Never    Smokeless tobacco: Never   Vaping Use    Vaping status: Every Day    Substances: Nicotine    Devices: Pre-filled pod   Substance Use Topics    Alcohol use: Yes     Comment: occasionally    Drug use: Yes     Types: Marijuana     Comment: has medical marijuana card; smokes marijuana  2 times a day        Allergies   Allergen Reactions    Sulfamethoxazole-Trimethoprim Hives/ Urticaria        Medications:  none    ROS:  See HPI above.   Constitutional:  Admits body aches.  Denies fevers   ENT:  Admits nasal congestion, sinus pressure, ear pain.    Respiratory:  Admits Cough and occasional wheezing  Cardiovascular: Denies chest pain, palpitations or edema.   Gastrointestinal: Denies N/V/D/C  Integumentary : Denies rashes           Objective:  Physical Exam:  This was a video visit and no vitals were obtained.   General: Patient is alert and oriented and in no distress.  Patient appears mildly fatigued and sounds congested.  Head: normocephalic, atraumatic  Eye: EOM  intact  Respiratory: Patient appears to be breathing at a normal rate with ease, no acute respiratory distress noted. No audible wheezing or adventitious sounds noted.  Cough noted intermittently during exam.  Neurologic: Alert and oriented.   Psychiatric: Patient is cooperative and in no acute distress.        Assessment and Plan:  Orders Placed This Encounter    promethazine-dextromethorphan (PHENERGAN-DM) 6.25-15 mg/5 mL Oral Syrup    albuterol sulfate (PROVENTIL OR VENTOLIN OR PROAIR) 90 mcg/actuation Inhalation oral inhaler    fluticasone propionate (FLONASE) 50 mcg/actuation Nasal Spray, Suspension       ICD-10-CM    1. Viral illness  B34.9       2. Cough, unspecified type  R05.9       3. Nasal congestion  R09.81         Discussed with patient's symptoms are likely viral in nature.  Advised patient to take at home COVID-19 viral test rule this out as a cause of current symptoms.  Recommended patient limit public exposure for approximately 5 days to help prevent spread of illness to others.  Work note provided.  Symptomatic treatment recommended.  Phenergan DM, albuterol inhaler and Flonase sent to pharmacy to use as needed for symptoms.  Encourage rest and good fluid intake.  Patient may use Tylenol/Motrin as needed for any pain or fevers.  Contact office or report ED if symptoms persist or worsen.  Patient verbalizes understanding and is agreeable  to the above plan.    Patient seen by: Traci Fields, PA-C     The supervising/collaborating physician for this visit was Dr. Lockie Pares    Please note this report may have been transcribed using voice recognition software and could contain mild typographical errors.

## 2022-06-20 NOTE — Progress Notes (Deleted)
FAMILY MEDICINE, [place]  [place] [address]    Video Visit     Name:  Traci [name] MRN: [id number]   Date:    06/20/2022 Age:  22 y.o.                                      Patient's location: [place] [address]   Patient/family aware of provider location: Yes  Patient/family consent for video visit: Yes  Interview and observation performed by: Rocco Serene, PA-C      Chief Complaint: No chief complaint on file.      Subjective:   Traci [name] is a 22 y.o. female ***    ROS     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    PONV (postoperative nausea and vomiting)     Smoker     vapes with nicotine    UTI (urinary tract infection)     Vitamin D deficiency            Cholecalciferol, Vitamin D3, (VITAMIN D-3) 50 mcg (2,000 unit) Oral Capsule, Take 1 Capsule by mouth Once a day  MOTEGRITY 2 mg Oral Tablet,     No facility-administered medications prior to visit.      Past Surgical History:   Procedure Laterality Date    FOOT SURGERY Right     HX CHOLECYSTECTOMY      HX DENTAL EXTRACTION      HX FOOT SURGERY      HX OTHER  10/30/2015    DIAG LAPARO SEPARATE PROC - fulgration of endometriotic implants    HX OTHER      ORIF Right foot    HX TONSIL AND ADENOIDECTOMY      HX TONSILLECTOMY      LAPAROSCOPIC ENDOMETRIOSIS FULGURATION      LAPAROSCOPIC ENDOMETRIOSIS FULGURATION      2nd surgery was to also drain a blood clot.           Objective:     Please noted that vitals were not obtained as this visit  was conducted via video.     Physical Exam       Assessment/Plan:     There are no diagnoses linked to this encounter.  No orders of the defined types were placed in this encounter.      Follow Up:   No follow-ups on file.    Rocco Serene, PA-C

## 2022-06-20 NOTE — Telephone Encounter (Signed)
Received notification of possible E prescribing transmission issue on this patient.  Please reach out to pharmacy to see if they received her albuterol, Flonase and Phenergan DM prescribed this morning.  Please let me know if new scripts are needed.  Thank you.
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
## 2022-06-20 NOTE — Telephone Encounter (Signed)
faxed

## 2022-06-23 ENCOUNTER — Ambulatory Visit (HOSPITAL_COMMUNITY): Payer: Self-pay | Admitting: Anesthesiology

## 2022-06-24 NOTE — Progress Notes (Signed)
Patient was not seen for visit today. Encounter created in error.

## 2022-07-05 ENCOUNTER — Ambulatory Visit (INDEPENDENT_AMBULATORY_CARE_PROVIDER_SITE_OTHER): Payer: Self-pay | Admitting: Physician Assistant

## 2022-09-16 ENCOUNTER — Other Ambulatory Visit: Payer: Self-pay

## 2022-09-16 DIAGNOSIS — S8992XA Unspecified injury of left lower leg, initial encounter: Secondary | ICD-10-CM

## 2022-09-16 DIAGNOSIS — W010XXA Fall on same level from slipping, tripping and stumbling without subsequent striking against object, initial encounter: Secondary | ICD-10-CM

## 2022-09-17 ENCOUNTER — Emergency Department (HOSPITAL_COMMUNITY): Payer: Worker's Comp, Other unspecified

## 2022-09-17 ENCOUNTER — Emergency Department
Admission: EM | Admit: 2022-09-17 | Discharge: 2022-09-17 | Disposition: A | Discharge status: Home / Self Care | Payer: Worker's Comp, Other unspecified | Attending: Emergency Medicine | Admitting: Emergency Medicine

## 2022-09-17 ENCOUNTER — Encounter (HOSPITAL_COMMUNITY): Payer: Self-pay

## 2022-09-17 DIAGNOSIS — S8992XA Unspecified injury of left lower leg, initial encounter: Secondary | ICD-10-CM | POA: Insufficient documentation

## 2022-09-17 DIAGNOSIS — Y990 Civilian activity done for income or pay: Secondary | ICD-10-CM | POA: Insufficient documentation

## 2022-09-17 DIAGNOSIS — W010XXA Fall on same level from slipping, tripping and stumbling without subsequent striking against object, initial encounter: Secondary | ICD-10-CM | POA: Insufficient documentation

## 2022-09-17 DIAGNOSIS — Z3200 Encounter for pregnancy test, result unknown: Secondary | ICD-10-CM | POA: Insufficient documentation

## 2022-09-17 LAB — HCG, SERUM QUALITATIVE, PREGNANCY: PREGNANCY, SERUM QUALITATIVE: NEGATIVE

## 2022-09-17 LAB — HCG, URINE QUALITATIVE, PREGNANCY
HCG URINE QUALITATIVE: UNDETERMINED — AB
SPECIFIC GRAVITY: 1.01 (ref 1.000–1.030)

## 2022-09-17 MED ORDER — ETODOLAC 400 MG TABLET
400.0000 mg | ORAL_TABLET | Freq: Two times a day (BID) | ORAL | 0 refills | Status: DC
Start: 2022-09-17 — End: 2022-10-19

## 2022-09-17 MED ORDER — KETOROLAC 60 MG/2 ML INTRAMUSCULAR SOLUTION
60.0000 mg | INTRAMUSCULAR | Status: AC
Start: 2022-09-17 — End: 2022-09-17
  Administered 2022-09-17: 60 mg via INTRAMUSCULAR
  Filled 2022-09-17: qty 2

## 2022-09-17 NOTE — ED Triage Notes (Signed)
Pt states "I fell at work and my knee is really hurting. I'm under care of Dr Lyndal Rainbow for my knee right now and I think I made things even worse"  Tripped over a broom, fell on left knee

## 2022-09-17 NOTE — ED Nurses Note (Signed)
Patient discharged home with family.  AVS reviewed with patient/care giver.  A written copy of the AVS and discharge instructions was given to the patient/care giver.  Questions sufficiently answered as needed.  Patient/care giver encouraged to follow up with PCP as indicated.  In the event of an emergency, patient/care giver instructed to call 911 or go to the nearest emergency room.

## 2022-09-17 NOTE — Discharge Instructions (Signed)
We have placed you in a knee immobilizer.  I would recommend you wear this when up and active.  Also use the crutches to assist with walking.  Indication for pain and inflammation has been prescribed.  Call Dr. Present early next week to arrange follow up.  Return with any increased pain or swelling, nausea or vomiting, fever or other concern

## 2022-09-17 NOTE — ED Provider Notes (Signed)
[facility] Medicine [facility]  Primary Attending Note  I was physically present and directly supervised this patients care. Patient seen and examined with the resident/APP and history/exam reviewed. Key elements in addition to and/or correction of that documentation is below.    HPI/ED COURSE/MDM:     Traci [name], date of birth 08-05-2000, is a 22 y.o. female who presents to the Emergency Department with a CC of Fall (Left knee pain).  This is a 22 year old female presenting to the emergency department for evaluation of an injury to the left knee.  The patient has a history of left knee problems and states that she is under the care of Dr. Lyndal Rainbow currently.  She has been seeing him since last August for knee problems.  She tripped this evening and landed on the leg jarring of the knee.  She did not fall or land directly on the knee.  There is no hip or ankle pain, no head injury or LOC, no neck or back pain, no chest, abdominal pain, nausea, vomiting or other complaint.  Presents now for evaluation.      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    PONV (postoperative nausea and vomiting)     Smoker     vapes with nicotine    UTI (urinary tract infection)     Vitamin D deficiency      Social History     Socioeconomic History    Marital status: Single   Tobacco Use    Smoking status: Never    Smokeless tobacco: Never   Vaping Use    Vaping status: Every Day    Substances:  Nicotine    Devices: Pre-filled pod   Substance and Sexual Activity    Alcohol use: Yes     Comment: occasionally    Drug use: Yes     Types: Marijuana     Comment: has medical marijuana card; smokes marijuana  2 times a day    Sexual activity: Yes     Birth control/protection: Condom     Social Determinants of Health     Financial Resource Strain: Low Risk  (12/17/2021)    Financial Resource Strain     SDOH Financial: No   Transportation Needs: Low Risk  (12/17/2021)    Transportation Needs     SDOH Transportation: No   Social Connections: Medium Risk (12/17/2021)    Social Connections     SDOH Social Isolation: 3 to 5 times a week   Intimate Partner Violence: Low Risk  (12/17/2021)    Intimate Partner Violence     SDOH Domestic Violence: No   Housing Stability: Low Risk  (  12/17/2021)    Housing Stability     SDOH Housing Situation: I have housing.     SDOH Housing Worry: No     Family History   Problem Relation Name Age of Onset    Thyroid Disease Mother      Hypertension (High Blood Pressure) Mother      Migraines Mother      No Known Problems Father      Alopecia Sister      COLITIS Brother       Current Outpatient Medications   Medication Sig    albuterol sulfate (PROVENTIL OR VENTOLIN OR PROAIR) 90 mcg/actuation Inhalation oral inhaler Take 1-2 Puffs by inhalation Every 6 hours as needed    etodolac (LODINE) 400 mg Oral Tablet Take 1 Tablet (400 mg total) by mouth Twice daily    fluticasone propionate (FLONASE) 50 mcg/actuation Nasal Spray, Suspension Administer 1-2 Sprays into each nostril Once a day    promethazine-dextromethorphan (PHENERGAN-DM) 6.25-15 mg/5 mL Oral Syrup Take 5 mL by mouth Four times a day as needed for Cough     Review of Systems   Constitutional:  Negative for fever.   HENT:  Negative for congestion.    Respiratory:  Negative for shortness of breath.    Cardiovascular:  Negative for chest pain.   Gastrointestinal:  Negative for abdominal pain and vomiting.   Musculoskeletal:  Positive for  joint swelling (Left knee).   Skin:  Negative for rash and wound.   Allergic/Immunologic: Negative for environmental allergies and food allergies.   Neurological:         No head injury or loss of consciousness   Psychiatric/Behavioral:  The patient is not nervous/anxious.          Medications given during ED stay include:  Medications Administered in the ED   ketorolac (TORADOL) 60mg /2 mL IM injection (has no administration in time range)           PHYSICAL EXAM:     ED Triage Vitals [09/17/22 0004]   BP (Non-Invasive) (!) 138/111   Heart Rate 87   Respiratory Rate 18   Temperature 37.1 C (98.8 F)   SpO2 98 %   Weight 76.2 kg (168 lb 1.6 oz)   Height 1.575 m (5\' 2" )     Physical Exam  Vitals and nursing note reviewed.   Constitutional:       General: She is in acute distress (She is tearful due to the pain in the knee).      Appearance: Normal appearance. She is normal weight. She is not ill-appearing, toxic-appearing or diaphoretic.   HENT:      Head: Normocephalic and atraumatic.      Right Ear: External ear normal.      Left Ear: External ear normal.      Nose: Nose normal.      Mouth/Throat:      Mouth: Mucous membranes are moist.   Eyes:      General: No scleral icterus.     Conjunctiva/sclera: Conjunctivae normal.   Pulmonary:      Effort: Pulmonary effort is normal.   Musculoskeletal:      Cervical back: Normal range of motion and neck supple.      Comments: The right knee appears to be slightly swollen but no effusion is noted.  Decreased range of motion though can flex and extend the knee when seated on the side of the bed.  She can also lift the leg off the  bed at the hip.  2+ pulses at the dorsalis pedis and posterior tibialis areas bilaterally.  Skin warm and dry, sensory exam intact to light touch.  There is diffuse tenderness in the anterior knee.  No proximal tib-fib, ankle or foot discomfort, no hip pain.   Skin:     General: Skin is warm and dry.      Capillary Refill: Capillary refill takes less  than 2 seconds.   Neurological:      General: No focal deficit present.      Mental Status: She is alert and oriented to person, place, and time.   Psychiatric:         Mood and Affect: Mood normal.         Behavior: Behavior normal.         DIAGNOSTICS   I have personally reviewed all laboratory and imaging results for this patient.  Results are listed below.    Labs:    Labs Ordered/Reviewed   HCG, URINE QUALITATIVE, PREGNANCY - Abnormal; Notable for the following components:       Result Value    HCG URINE QUALITATIVE Indeterminate (*)     All other components within normal limits    Narrative:     The pregnancy test shows an indeterminate result, suggesting an hCG level of less than 20 mIU hCG/mL.  Repeated analysis in 48 hours is recommended.  If this is not medically feasible, a quantitative serum hCG test should be performed.   HCG, SERUM QUALITATIVE, PREGNANCY - Normal         Radiology:    No orders to display       EKG:  Reviewed and interpreted by ED physician  No results found for this visit on 09/17/22 (from the past 720 hour(s)).    ED Course        Medical Decision Making  This is a 22 year old female who jarred her left knee which was already in some discomfort due to pre-existing knee problems for which she states she is seeing Dr. Lyndal Rainbow.  At this time there is no evidence of effusion.  There is no suspicion for infectious etiology given the history of trauma.  X-rays will be performed.  She is hemodynamically stable for further evaluation and treatment.    X-rays of the left knee show no obvious fracture or dislocation.  At this time I would recommend knee immobilizer crutches and follow up with Dr. Present.  Anti-inflammatory medication will be prescribed in the patient is hemodynamically stable for discharge to home.  Strict return precautions are provided    Amount and/or Complexity of Data Reviewed  Radiology: ordered.    Risk  Prescription drug management.                  Medications  Administered in the ED   ketorolac (TORADOL) 60mg /2 mL IM injection (has no administration in time range)     Clinical Impression   Injury of left knee, initial encounter (Primary)         Procedures:   Procedures      IMPRESSION AND DISPOSITION     Clinical Impression:   Clinical Impression   Injury of left knee, initial encounter (Primary)       Disposition: Discharged      Myna Bright MPA, PA-C    Parts of this patients chart were completed in a retrospective fashion due to simultaneous direct patient care activities in the Emergency Department. This note was  partially generated using MModal Fluency Direct System, and there may be some incorrect words, spellings, and punctuation that were not noted in proofreading the note prior to saving.

## 2022-10-13 ENCOUNTER — Ambulatory Visit (INDEPENDENT_AMBULATORY_CARE_PROVIDER_SITE_OTHER): Payer: Self-pay | Admitting: Physician Assistant

## 2022-10-14 IMAGING — MR MRI KNEE LT W/O CONTRAST
5 of 6 series · 37 of 40 positions shown · non-contrast
Comparison: none

TECHNIQUE: Proton density and T2-weighted sagittal images of the knee were taken.  Proton density coronal and axial images were also obtained.

[Series 4: PD fat-sat · axial · 3.0mm · 0.47mm/px · z∈[-64,+84]mm · 10 of 38 slices shown (1 of 3)]
[im 1/38]
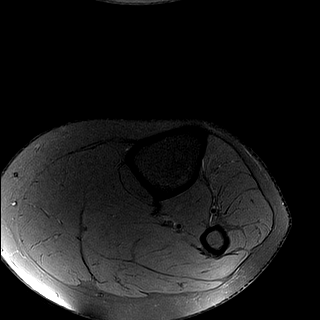
[im 5/38]
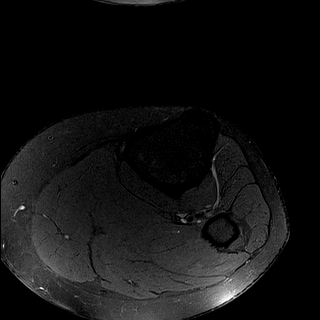
[im 9/38]
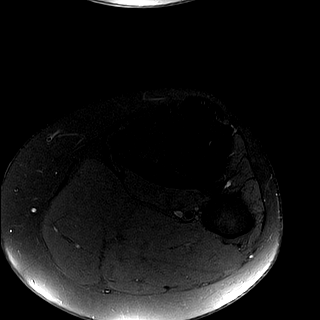
[im 13/38]
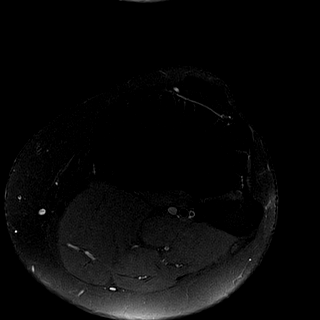
[im 17/38]
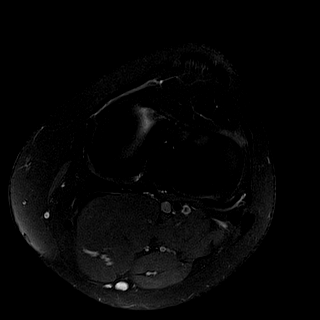
[im 21/38]
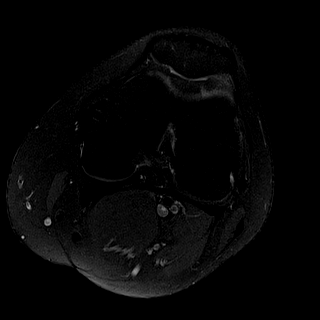
[im 25/38]
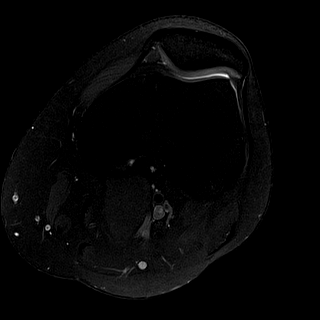
[im 29/38]
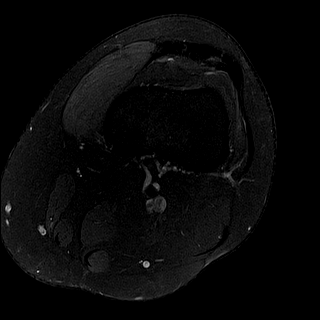
[im 33/38]
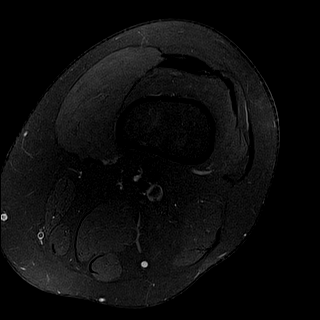
[im 38/38]
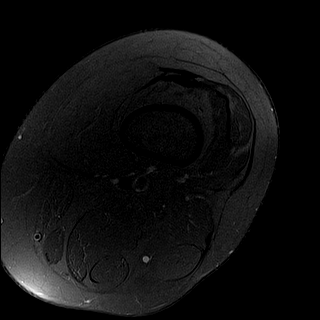

[Series 5: PD fat-sat · sagittal · 4.0mm · 0.47mm/px · 7 of 25 slices shown (2 of 3)]
[im 1/25]
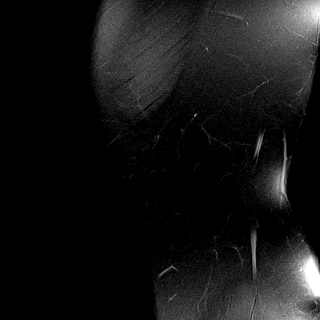
[im 5/25]
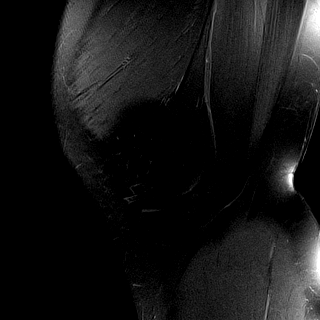
[im 9/25]
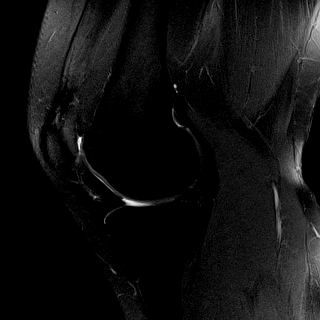
[im 13/25]
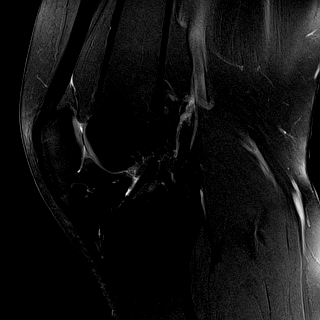
[im 17/25]
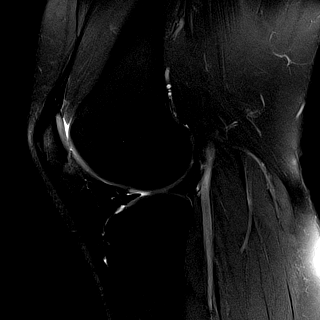
[im 21/25]
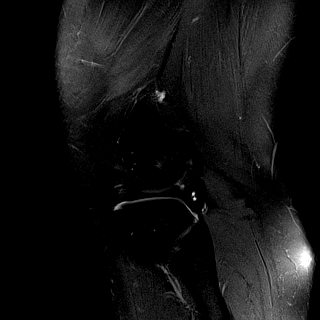
[im 25/25]
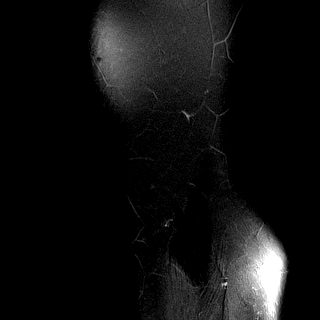

[Series 6: T2 · sagittal · 4.0mm · 0.47mm/px · 7 of 25 slices shown]
[im 1/25]
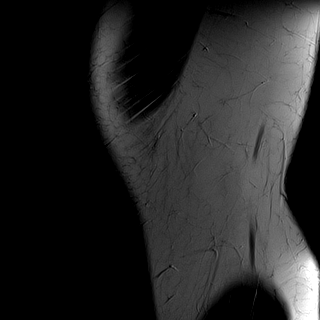
[im 5/25]
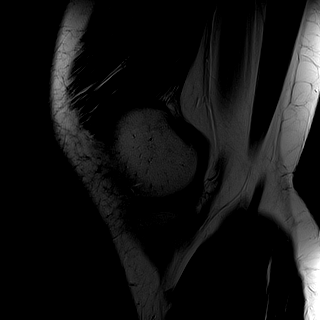
[im 9/25]
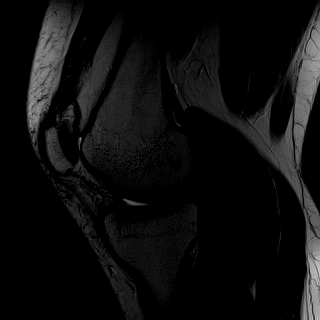
[im 13/25]
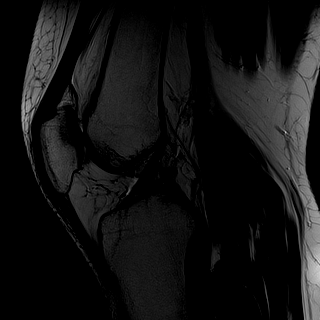
[im 17/25]
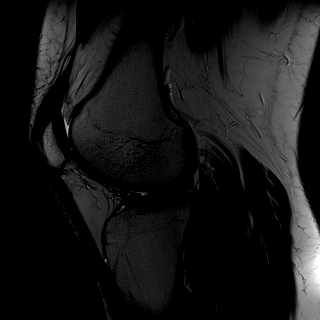
[im 21/25]
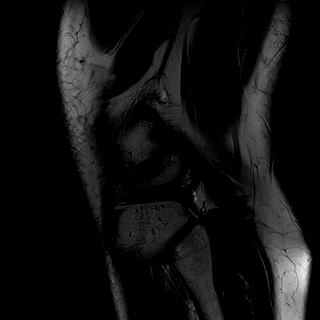
[im 25/25]
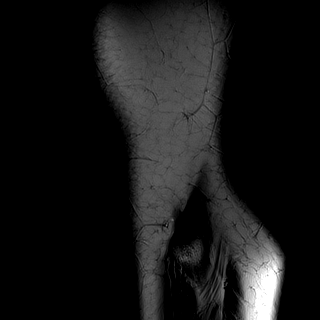

[Series 7: PD · sagittal · 4.0mm · 0.59mm/px · 7 of 25 slices shown]
[im 1/25]
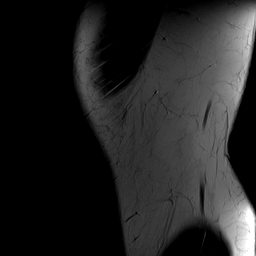
[im 5/25]
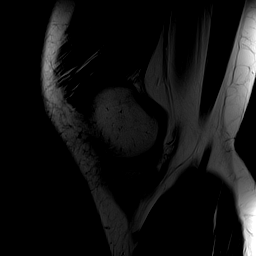
[im 9/25]
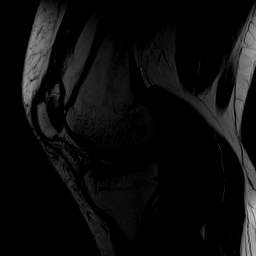
[im 13/25]
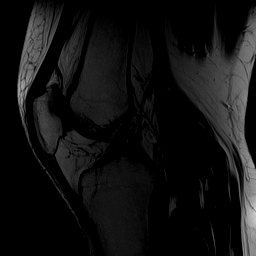
[im 17/25]
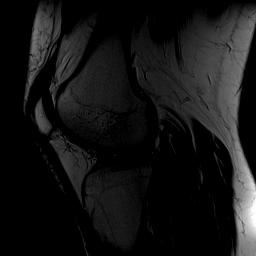
[im 21/25]
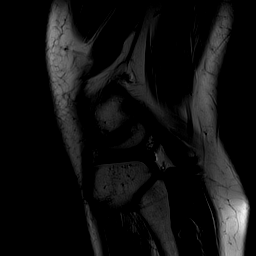
[im 25/25]
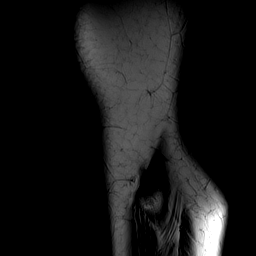

[Series 8: PD fat-sat · coronal · 4.0mm · 0.47mm/px · 6 of 24 slices shown (3 of 3)]
[im 1/24]
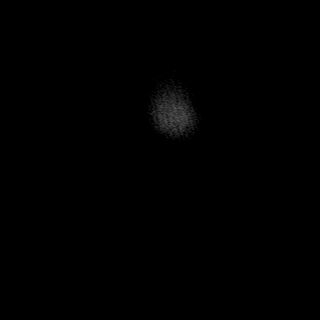
[im 5/24]
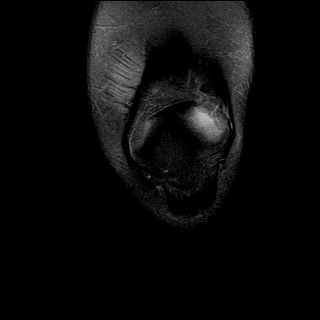
[im 10/24]
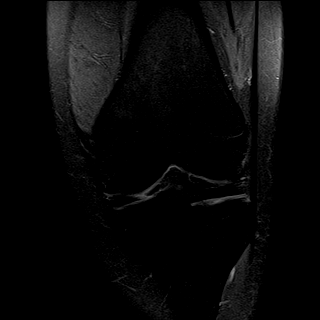
[im 14/24]
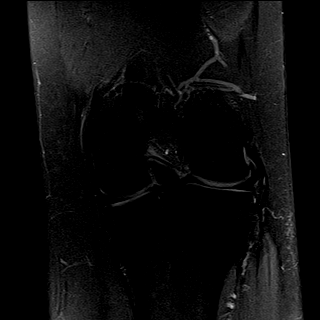
[im 19/24]
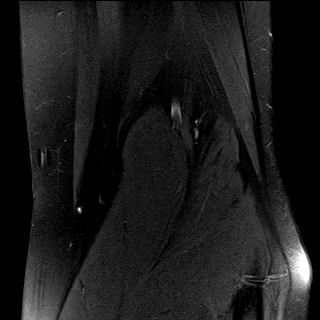
[im 24/24]
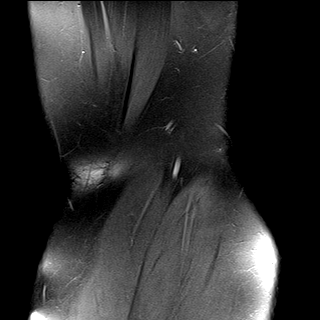

[37 of 40 positions shown; findings below may reference images not displayed]

FINDINGS: Alignment is normal.

There is no fracture.  

There is minimal joint fluid with no effusion.    

No meniscal tear identified, medially or laterally.  

Anterior and posterior cruciate ligaments are normal.  

Collateral ligaments are normal.  

No chondral abnormality identified.  Bones appear normal.  

The distal quadriceps tendon and the patellar tendon are normal.  

No muscle or tendon tear in the popliteal space identified.
IMPRESSION: Normal MRI of the left knee.  No abnormality identified.

## 2022-10-17 ENCOUNTER — Ambulatory Visit (INDEPENDENT_AMBULATORY_CARE_PROVIDER_SITE_OTHER): Payer: Self-pay | Admitting: Physician Assistant

## 2022-10-18 ENCOUNTER — Ambulatory Visit (INDEPENDENT_AMBULATORY_CARE_PROVIDER_SITE_OTHER): Payer: Self-pay | Admitting: Physician Assistant

## 2022-10-19 ENCOUNTER — Other Ambulatory Visit: Payer: Self-pay

## 2022-10-19 ENCOUNTER — Emergency Department
Admission: EM | Admit: 2022-10-19 | Discharge: 2022-10-19 | Disposition: A | Discharge status: Home / Self Care | Payer: Self-pay | Attending: Physician Assistant | Admitting: Physician Assistant

## 2022-10-19 ENCOUNTER — Ambulatory Visit (INDEPENDENT_AMBULATORY_CARE_PROVIDER_SITE_OTHER): Payer: Self-pay | Admitting: Family

## 2022-10-19 ENCOUNTER — Emergency Department (HOSPITAL_COMMUNITY): Payer: Self-pay

## 2022-10-19 ENCOUNTER — Encounter (HOSPITAL_COMMUNITY): Payer: Self-pay

## 2022-10-19 DIAGNOSIS — Z87442 Personal history of urinary calculi: Secondary | ICD-10-CM | POA: Insufficient documentation

## 2022-10-19 DIAGNOSIS — N3289 Other specified disorders of bladder: Secondary | ICD-10-CM | POA: Insufficient documentation

## 2022-10-19 DIAGNOSIS — R109 Unspecified abdominal pain: Secondary | ICD-10-CM | POA: Insufficient documentation

## 2022-10-19 DIAGNOSIS — Z87440 Personal history of urinary (tract) infections: Secondary | ICD-10-CM | POA: Insufficient documentation

## 2022-10-19 DIAGNOSIS — Z3200 Encounter for pregnancy test, result unknown: Secondary | ICD-10-CM | POA: Insufficient documentation

## 2022-10-19 DIAGNOSIS — R319 Hematuria, unspecified: Secondary | ICD-10-CM

## 2022-10-19 DIAGNOSIS — N83201 Unspecified ovarian cyst, right side: Secondary | ICD-10-CM | POA: Insufficient documentation

## 2022-10-19 LAB — URINALYSIS, MICROSCOPIC

## 2022-10-19 LAB — COMPREHENSIVE METABOLIC PANEL, NON-FASTING
ALBUMIN: 4.7 g/dL (ref 3.5–5.0)
ALKALINE PHOSPHATASE: 61 U/L (ref 40–110)
ALT (SGPT): 7 U/L — ABNORMAL LOW (ref 8–22)
ANION GAP: 8 mmol/L (ref 4–13)
AST (SGOT): 12 U/L (ref 8–45)
BILIRUBIN TOTAL: 1.2 mg/dL (ref 0.3–1.3)
BUN/CREA RATIO: 10 (ref 6–22)
BUN: 8 mg/dL (ref 8–25)
CALCIUM: 9.8 mg/dL (ref 8.6–10.2)
CHLORIDE: 107 mmol/L (ref 96–111)
CO2 TOTAL: 21 mmol/L — ABNORMAL LOW (ref 22–30)
CREATININE: 0.81 mg/dL (ref 0.60–1.05)
ESTIMATED GFR - FEMALE: >90 mL/min/BSA (ref >=60)
GLUCOSE: 89 mg/dL (ref 65–125)
POTASSIUM: 3.9 mmol/L (ref 3.5–5.1)
PROTEIN TOTAL: 8.5 g/dL — ABNORMAL HIGH (ref 6.4–8.3)
SODIUM: 136 mmol/L (ref 136–145)

## 2022-10-19 LAB — CBC WITH DIFF
BASOPHIL #: <0.1 10*3/uL (ref <=0.20)
BASOPHIL %: 0 %
EOSINOPHIL #: <0.1 10*3/uL (ref <=0.50)
EOSINOPHIL %: 0 %
HCT: 39.9 % (ref 34.8–46.0)
HGB: 14.7 g/dL (ref 11.5–16.0)
IMMATURE GRANULOCYTE #: <0.1 10*3/uL (ref <0.10)
IMMATURE GRANULOCYTE %: 1 % (ref 0.0–1.0)
LYMPHOCYTE #: 2.01 10*3/uL (ref 1.00–4.80)
LYMPHOCYTE %: 19 %
MCH: 30.1 pg (ref 26.0–32.0)
MCHC: 36.8 g/dL — ABNORMAL HIGH (ref 31.0–35.5)
MCV: 81.8 fL (ref 78.0–100.0)
MONOCYTE #: 0.71 10*3/uL (ref 0.20–1.10)
MONOCYTE %: 7 %
MPV: 11.2 fL (ref 8.7–12.5)
NEUTROPHIL #: 7.76 10*3/uL — ABNORMAL HIGH (ref 1.50–7.70)
NEUTROPHIL %: 73 %
PLATELETS: 267 10*3/uL (ref 150–400)
RBC: 4.88 10*6/uL (ref 3.85–5.22)
RDW-CV: 12.2 % (ref 11.5–15.5)
WBC: 10.6 10*3/uL (ref 3.7–11.0)

## 2022-10-19 LAB — URINALYSIS, MACRO/MICRO
BILIRUBIN: NEGATIVE mg/dL
BLOOD: NEGATIVE mg/dL
GLUCOSE: NEGATIVE mg/dL
KETONES: 15 mg/dL — AB
NITRITE: NEGATIVE
PH: 6 (ref 5.0–9.0)
PROTEIN: 100 mg/dL — AB
SPECIFIC GRAVITY: 1.029 (ref 1.001–1.030)
UROBILINOGEN: 1 mg/dL (ref 0.2–1.0)

## 2022-10-19 LAB — LIPASE: LIPASE: 18 U/L (ref 10–60)

## 2022-10-19 LAB — PTT (PARTIAL THROMBOPLASTIN TIME): APTT: 29.6 seconds (ref 23.0–32.0)

## 2022-10-19 LAB — RED TOP TUBE

## 2022-10-19 LAB — PT/INR
INR: 1.05 (ref 0.90–1.10)
PROTHROMBIN TIME: 11.4 seconds (ref 9.0–13.0)

## 2022-10-19 LAB — HCG, URINE QUALITATIVE, PREGNANCY
HCG URINE QUALITATIVE: NEGATIVE
SPECIFIC GRAVITY: 1.027 (ref 1.000–1.030)

## 2022-10-19 MED ORDER — KETOROLAC 30 MG/ML (1 ML) INJECTION SOLUTION
30.0000 mg | INTRAMUSCULAR | Status: AC
Start: 2022-10-19 — End: 2022-10-19
  Administered 2022-10-19: 30 mg via INTRAVENOUS
  Filled 2022-10-19: qty 1

## 2022-10-19 MED ORDER — IOPAMIDOL 300 MG IODINE/ML (61 %) INTRAVENOUS SOLUTION
70.0000 mL | INTRAVENOUS | Status: AC
Start: 2022-10-19 — End: 2022-10-19
  Administered 2022-10-19: 70 mL via INTRAVENOUS

## 2022-10-19 MED ORDER — SODIUM CHLORIDE 0.9 % IV BOLUS
1000.0000 mL | INJECTION | Status: AC
Start: 2022-10-19 — End: 2022-10-19
  Administered 2022-10-19: 1000 mL via INTRAVENOUS
  Administered 2022-10-19: 0 mL via INTRAVENOUS

## 2022-10-19 NOTE — ED Nurses Note (Signed)
Patient discharged home with family.  AVS reviewed with patient/care giver.  A written copy of the AVS and discharge instructions was given to the patient/care giver.  Questions sufficiently answered as needed.  Patient/care giver encouraged to follow up with PCP as indicated.  In the event of an emergency, patient/care giver instructed to call 911 or go to the nearest emergency room.

## 2022-10-19 NOTE — ED Triage Notes (Signed)
Bilateral flank pain, blood in urine since this morning, fever 102.4 pt took 1000 mg tylenol around 1045

## 2022-10-19 NOTE — ED Provider Notes (Signed)
[facility]           Name: Traci [name]  Age and Gender: 22 y.o. female  PCP: Peter Minium, MD    Chief Complaint:  Patient presents with     Chief Complaint   Patient presents with    Blood in urine       HPI    HPI     Traci [name], date of birth [birth year], is a 22 y.o. female who presents to the Emergency Department with a chief complaint of bilateral flank pain since 6:00 a.m.Marland Kitchen  She had a fever of 102.4 around 10:00 a.m.Marland Kitchen  She did take Tylenol for this.  Her pain is a 10/10 with no worsening factors.  It does improve with pressure.  She does have a history of this pain in the past at which time she would pyelonephritis and ureteral stones.  She has noted hematuria.  Denies any other urinary complaints.  Denies any chance of pregnancy.  Last menstrual cycle approximately 2 months ago.  She states she has endometriosis and irregular cycles.    Other than noted above, review of systems obtained and negative.      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    PONV (postoperative nausea and vomiting)     Smoker     vapes with nicotine    UTI (urinary tract infection)     Vitamin D deficiency        Past Surgical History:  Past Surgical History:   Procedure Laterality Date    Foot surgery Right     Hx cholecystectomy      Hx dental extraction      Hx foot surgery      Hx other  10/30/2015     Hx other      Hx tonsil and adenoidectomy      Hx tonsillectomy      Laparoscopic endometriosis fulguration      Laparoscopic endometriosis fulguration         Family History:   Family History   Problem Relation Age of Onset    Thyroid Disease Mother     Hypertension (High Blood Pressure) Mother     Migraines Mother     No Known Problems Father     Alopecia Sister     COLITIS Brother        Social History     Social History     Tobacco Use    Smoking status: Never    Smokeless tobacco: Never   Vaping Use    Vaping status: Every Day  Substances: Nicotine    Devices: Pre-filled pod   Substance Use Topics    Alcohol use: Yes     Comment: occasionally    Drug use: Yes     Types: Marijuana     Comment: has medical marijuana card; smokes marijuana  2 times a day       Social History     Substance and Sexual Activity   Drug Use Yes    Types: Marijuana    Comment: has medical marijuana card; smokes marijuana  2 times a day       Peter Minium, MD    Allergies   Allergen Reactions    Sulfamethoxazole-Trimethoprim Hives/ Urticaria       Outpatient Medications Marked as Taking for the 10/19/22 encounter [facility] Encounter)   Medication Sig    ergocalciferol, vitamin D2, (VITAMIN D2 ORAL) Take by mouth    prucalopride (MOTEGRITY) 1 mg Oral Tablet Take 50 Tablets (50 mg total) by mouth Once a day       PE:   ED Triage Vitals   BP (Non-Invasive) 10/19/22 1156 (!) 141/106   Heart Rate 10/19/22 1156 98   Respiratory Rate 10/19/22 1156 18   Temperature 10/19/22 1156 36.7 C (98.1 F)   SpO2 10/19/22 1156 98 %   Weight 10/19/22 1153 72.7 kg (160 lb 4.4 oz)   Height --      Physical Exam    Constitutional: Pt is well-developed and well-nourished Caucasian female.  Head: Normocephalic and atraumatic.   Eyes: Conjunctivae are normal. EOM are intact  Neck: Soft, supple, full range of motion.  No tenderness.  Cardiovascular: RRR. No murmur.   Pulmonary: Normal breath sounds b/l. No wheezing or rales. No respiratory  distress.  Abdominal: Soft, nontender, nondistended.  No rebound, guarding, or masses.  Moderate bilateral CVAT.  Musculoskeletal: Normal range of motion. No deformities.  Exhibits no edema and no tenderness.   Neurological: CNs 2-12 grossly intact.  No focal deficits noted. [id number]  Skin: Warm and dry. No rash or lesions  Psychiatric: Patient has a normal mood and affect.     DDx:  Differential diagnosis includes, but is not limited to ureteral stone, pyelonephritis, urinary tract infection    Initial workup:      Orders:  Orders Placed This Encounter    URINE CULTURE,ROUTINE    CANCELED: CT ABDOMEN PELVIS WO IV CONTRAST    CT ABDOMEN PELVIS W IV CONTRAST    CBC/DIFF    COMPREHENSIVE METABOLIC PANEL, NON-FASTING    URINALYSIS WITH REFLEX MICROSCOPIC AND CULTURE IF POSITIVE    HCG, URINE QUALITATIVE, PREGNANCY    LIPASE    PT/INR    PTT (PARTIAL THROMBOPLASTIN TIME)    CBC WITH DIFF    URINALYSIS, MACRO/MICRO    EXTRA TUBES    RED TOP TUBE    URINALYSIS, MICROSCOPIC    INSERT & MAINTAIN PERIPHERAL IV ACCESS    ketorolac (TORADOL) 30 mg/mL injection    iopamidol (ISOVUE-300) 61% infusion    NS bolus infusion 1,000 mL           Diagnostics:  I have personally reviewed all laboratory and imaging results for this patient.  Results are as listed below.  Labs:  Results for orders placed or performed during the hospital encounter of 10/19/22 (from the past 12 hour(s))   COMPREHENSIVE METABOLIC PANEL, NON-FASTING   Result Value Ref Range    SODIUM 136 136 - 145 mmol/L    POTASSIUM 3.9 3.5 -  5.1 mmol/L    CHLORIDE 107 96 - 111 mmol/L    CO2 TOTAL 21 (L) 22 - 30 mmol/L    ANION GAP 8 4 - 13 mmol/L    BUN 8 8 - 25 mg/dL    CREATININE 4.40 3.47 - 1.05 mg/dL    BUN/CREA RATIO 10 6 - 22    ALBUMIN 4.7 3.5 - 5.0 g/dL     CALCIUM 9.8 8.6 - 42.5 mg/dL    GLUCOSE 89 65 - 956 mg/dL    ALKALINE PHOSPHATASE 61 40 - 110 U/L    ALT (SGPT) 7 (L) 8 - 22 U/L    AST (SGOT)  12 8 - 45 U/L    BILIRUBIN TOTAL 1.2 0.3 - 1.3 mg/dL    PROTEIN TOTAL  8.5 (H) 6.4 - 8.3 g/dL    ESTIMATED GFR - FEMALE >90 >=60 mL/min/BSA   HCG, URINE QUALITATIVE, PREGNANCY   Result Value Ref Range    HCG URINE QUALITATIVE Negative Negative    SPECIFIC GRAVITY 1.027 1.000 - 1.030   LIPASE   Result Value Ref Range    LIPASE 18 10 - 60 U/L   PT/INR   Result Value Ref Range    PROTHROMBIN TIME 11.4 9.0 - 13.0 seconds    INR 1.05 0.90 - 1.10   PTT (PARTIAL THROMBOPLASTIN TIME)   Result Value Ref Range    APTT 29.6 23.0 - 32.0 seconds   CBC WITH DIFF   Result Value Ref Range    WBC 10.6 3.7 - 11.0 x10^3/uL    RBC 4.88 3.85 - 5.22 x10^6/uL    HGB 14.7 11.5 - 16.0 g/dL    HCT 38.7 56.4 - 33.2 %    MCV 81.8 78.0 - 100.0 fL    MCH 30.1 26.0 - 32.0 pg    MCHC 36.8 (H) 31.0 - 35.5 g/dL    RDW-CV 95.1 88.4 - 16.6 %    PLATELETS 267 150 - 400 x10^3/uL    MPV 11.2 8.7 - 12.5 fL    NEUTROPHIL % 73.0 %    LYMPHOCYTE % 19.0 %    MONOCYTE % 7.0 %    EOSINOPHIL % 0.0 %    BASOPHIL % 0.0 %    NEUTROPHIL # 7.76 (H) 1.50 - 7.70 x10^3/uL    LYMPHOCYTE # 2.01 1.00 - 4.80 x10^3/uL    MONOCYTE # 0.71 0.20 - 1.10 x10^3/uL    EOSINOPHIL # <0.10 <=0.50 x10^3/uL    BASOPHIL # <0.10 <=0.20 x10^3/uL    IMMATURE GRANULOCYTE % 1.0 0.0 - 1.0 %    IMMATURE GRANULOCYTE # <0.10 <0.10 x10^3/uL   URINALYSIS, MACRO/MICRO   Result Value Ref Range    COLOR Yellow Yellow    APPEARANCE Clear Clear    SPECIFIC GRAVITY 1.029 1.001 - 1.030    PH 6.0 5.0 - 9.0    PROTEIN 100 (A) Negative mg/dL    GLUCOSE Negative Negative mg/dL    KETONES 15 (A) Negative mg/dL    UROBILINOGEN 1.0 0.2 - 1.0 mg/dL    BILIRUBIN Negative Negative mg/dL    BLOOD Negative Negative mg/dL    NITRITE Negative Negative    LEUKOCYTES Trace (A) Negative WBCs/uL   URINALYSIS, MICROSCOPIC   Result Value Ref Range    SQUAMOUS EPITHELIAL Moderate (A) None /hpf    WBCS 5-10 (A) None /hpf    RBCS 0-2 (A) None /hpf    BACTERIA Rare (A) Negative /hpf    HYALINE CASTS 10-20 (A) None /  lpf     Labs reviewed and interpreted by me.    Radiology:    CT ABDOMEN PELVIS W IV  CONTRAST   Final Result by Edi, Radresults In (06/26 1426)   1. Mild diffuse urinary bladder wall thickening, although the bladder is decompressed. Correlate with urinalysis for possible cystitis.   2. Right adnexal cyst measuring 4.8 cm.         Signed by Darrol Poke, MD          EKG:  No results found for this visit on 10/19/22 (from the past 720 hour(s)).    Medical Decision Making  This is a 22 year old female who presents with bilateral flank pain.  CBC, CMP, lipase, urinalysis, urine pregnancy, PT and PTT ordered and unremarkable.  CT of the abdomen pelvis ordered and shows mild diffuse urinary bladder wall thickening correlate with urinalysis for cystitis.  Right adnexal cyst.  Urinalysis shows trace leukocytes, moderate squamous epithelia cells, 5-10 wbc's and rare bacteria.  Urinalysis was sent for culture.  Will await these results prior to treatment.  She understands and agrees to follow-up with her PCP and return if any new or worsening symptoms.  I did re-examine her prior to discharge and her pain did improve.    Amount and/or Complexity of Data Reviewed  Labs: ordered. Decision-making details documented in ED Course.  Radiology: ordered and independent interpretation performed. Decision-making details documented in ED Course.    Risk  Prescription drug management.        ED Course:    During the patient's stay in the emergency department, the above listed imaging and/or labs were performed to assist with medical decision making and were reviewed by myself when available for review.   Relevant prior external notes and tests were reviewed by myself if available.  Patient rechecked and remained stable throughout remainder of emergency department course.   All questions/concerns addressed, and patient agrees with disposition plan.      ED Course as of 10/19/22 1443   Wed Oct 19, 2022   1442 LEUKOCYTES(!): Trace   1442 SQUAMOUS EPITHELIAL(!): Moderate   1442 WBC'S(!): 5-10   1442 BACTERIA(!): Rare        Medications given during ED stay include:  Medications Administered in the ED   ketorolac (TORADOL) 30 mg/mL injection (30 mg Intravenous Given 10/19/22 1225)   iopamidol (ISOVUE-300) 61% infusion (70 mL Intravenous Given 10/19/22 1318)   NS bolus infusion 1,000 mL (0 mL Intravenous Stopped 10/19/22 1402)       Current Discharge Medication List          Clinical Impression:   Clinical Impression   Flank pain (Primary)       Disposition: Discharged        // Cyndee Brightly PA-C 10/19/2022, 12:16   [facility], [facility] Emergency Department    Parts of this chart were completed in a retrospective fashion due to simultaneous patient care activities in the Emergency Department.

## 2022-10-20 LAB — URINE CULTURE,ROUTINE: URINE CULTURE: 70000 — AB

## 2022-10-24 ENCOUNTER — Inpatient Hospital Stay (INDEPENDENT_AMBULATORY_CARE_PROVIDER_SITE_OTHER): Payer: Self-pay | Admitting: INTERNAL MEDICINE

## 2022-11-01 ENCOUNTER — Emergency Department (HOSPITAL_COMMUNITY): Payer: Self-pay

## 2022-11-01 ENCOUNTER — Encounter (HOSPITAL_COMMUNITY): Payer: Self-pay | Admitting: Emergency Medicine

## 2022-11-01 ENCOUNTER — Other Ambulatory Visit: Payer: Self-pay

## 2022-11-01 ENCOUNTER — Emergency Department
Admission: EM | Admit: 2022-11-01 | Discharge: 2022-11-01 | Disposition: A | Discharge status: Home / Self Care | Payer: Self-pay | Attending: Emergency Medicine | Admitting: Emergency Medicine

## 2022-11-01 DIAGNOSIS — Z8679 Personal history of other diseases of the circulatory system: Secondary | ICD-10-CM | POA: Insufficient documentation

## 2022-11-01 DIAGNOSIS — Z3200 Encounter for pregnancy test, result unknown: Secondary | ICD-10-CM | POA: Insufficient documentation

## 2022-11-01 DIAGNOSIS — R000 Tachycardia, unspecified: Secondary | ICD-10-CM

## 2022-11-01 DIAGNOSIS — R002 Palpitations: Secondary | ICD-10-CM | POA: Insufficient documentation

## 2022-11-01 DIAGNOSIS — Z79899 Other long term (current) drug therapy: Secondary | ICD-10-CM | POA: Insufficient documentation

## 2022-11-01 LAB — COMPREHENSIVE METABOLIC PANEL, NON-FASTING
ALBUMIN: 4.7 g/dL (ref 3.5–5.0)
ALKALINE PHOSPHATASE: 57 U/L (ref 40–110)
ALT (SGPT): 9 U/L (ref 8–22)
ANION GAP: 10 mmol/L (ref 4–13)
AST (SGOT): 14 U/L (ref 8–45)
BILIRUBIN TOTAL: 0.5 mg/dL (ref 0.3–1.3)
BUN/CREA RATIO: 9 (ref 6–22)
BUN: 8 mg/dL (ref 8–25)
CALCIUM: 10.2 mg/dL (ref 8.6–10.2)
CHLORIDE: 107 mmol/L (ref 96–111)
CO2 TOTAL: 23 mmol/L (ref 22–30)
CREATININE: 0.86 mg/dL (ref 0.60–1.05)
ESTIMATED GFR - FEMALE: >90 mL/min/BSA (ref >=60)
GLUCOSE: 92 mg/dL (ref 65–125)
POTASSIUM: 4.1 mmol/L (ref 3.5–5.1)
PROTEIN TOTAL: 8.5 g/dL — ABNORMAL HIGH (ref 6.4–8.3)
SODIUM: 140 mmol/L (ref 136–145)

## 2022-11-01 LAB — URINALYSIS, MACRO/MICRO
BILIRUBIN: NEGATIVE mg/dL
BLOOD: NEGATIVE mg/dL
GLUCOSE: NEGATIVE mg/dL
KETONES: NEGATIVE mg/dL
LEUKOCYTES: NEGATIVE WBCs/uL
NITRITE: NEGATIVE
PH: 6 (ref 5.0–8.0)
PROTEIN: NEGATIVE mg/dL
SPECIFIC GRAVITY: >=1.03 — ABNORMAL HIGH (ref 1.005–?)
UROBILINOGEN: 0.2 mg/dL (ref 0.2–1.0)

## 2022-11-01 LAB — ECG 12 LEAD
Atrial Rate: 70 {beats}/min
Calculated P Axis: 39 degrees
Calculated R Axis: 68 degrees
Calculated T Axis: 41 degrees
PR Interval: 138 ms
QRS Duration: 88 ms
QT Interval: 372 ms
QTC Calculation: 401 ms
Ventricular rate: 70 {beats}/min

## 2022-11-01 LAB — CBC WITH DIFF
BASOPHIL #: <0.1 10*3/uL (ref <=0.20)
BASOPHIL %: 0 %
EOSINOPHIL #: <0.1 10*3/uL (ref <=0.50)
EOSINOPHIL %: 1 %
HCT: 42.2 % (ref 34.8–46.0)
HGB: 14.9 g/dL (ref 11.5–16.0)
IMMATURE GRANULOCYTE #: <0.1 10*3/uL (ref <0.10)
IMMATURE GRANULOCYTE %: 1 % (ref 0.0–1.0)
LYMPHOCYTE #: 2.98 10*3/uL (ref 1.00–4.80)
LYMPHOCYTE %: 38 %
MCH: 29.6 pg (ref 26.0–32.0)
MCHC: 35.3 g/dL (ref 31.0–35.5)
MCV: 83.9 fL (ref 78.0–100.0)
MONOCYTE #: 0.48 10*3/uL (ref 0.20–1.10)
MONOCYTE %: 6 %
MPV: 10.9 fL (ref 8.7–12.5)
NEUTROPHIL #: 4.36 10*3/uL (ref 1.50–7.70)
NEUTROPHIL %: 54 %
PLATELETS: 323 10*3/uL (ref 150–400)
RBC: 5.03 10*6/uL (ref 3.85–5.22)
RDW-CV: 13 % (ref 11.5–15.5)
WBC: 7.9 10*3/uL (ref 3.7–11.0)

## 2022-11-01 LAB — PT/INR
INR: 0.94 (ref 0.90–1.10)
PROTHROMBIN TIME: 10.3 seconds (ref 9.0–13.0)

## 2022-11-01 LAB — MAGNESIUM: MAGNESIUM: 2.2 mg/dL (ref 1.8–2.6)

## 2022-11-01 LAB — PTT (PARTIAL THROMBOPLASTIN TIME): APTT: 27.4 seconds (ref 23.0–32.0)

## 2022-11-01 LAB — RED TOP TUBE

## 2022-11-01 LAB — HCG, URINE QUALITATIVE, PREGNANCY
HCG URINE QUALITATIVE: NEGATIVE
SPECIFIC GRAVITY: 1.022 (ref 1.000–1.030)

## 2022-11-01 LAB — THYROXINE, FREE (FREE T4): THYROXINE (T4), FREE: 0.96 ng/dL (ref 0.70–1.48)

## 2022-11-01 LAB — THYROID STIMULATING HORMONE WITH FREE T4 REFLEX: TSH: 11.353 u[IU]/mL — ABNORMAL HIGH (ref 0.350–4.940)

## 2022-11-01 LAB — TROPONIN-I: TROPONIN-I HS: <2.7 ng/L (ref <=14.0)

## 2022-11-01 MED ORDER — ACETAMINOPHEN 325 MG TABLET
650.0000 mg | ORAL_TABLET | ORAL | Status: AC
Start: 2022-11-01 — End: 2022-11-01
  Administered 2022-11-01: 650 mg via ORAL
  Filled 2022-11-01: qty 2

## 2022-11-01 MED ORDER — SODIUM CHLORIDE 0.9 % IV BOLUS
1000.0000 mL | INJECTION | Status: AC
Start: 2022-11-01 — End: 2022-11-01
  Administered 2022-11-01: 1000 mL via INTRAVENOUS
  Administered 2022-11-01: 0 mL via INTRAVENOUS

## 2022-11-01 MED ORDER — HYDROXYZINE PAMOATE 25 MG CAPSULE
50.0000 mg | ORAL_CAPSULE | ORAL | Status: AC
Start: 2022-11-01 — End: 2022-11-01
  Administered 2022-11-01: 50 mg via ORAL
  Filled 2022-11-01: qty 2

## 2022-11-01 MED ORDER — HYDROXYZINE PAMOATE 25 MG CAPSULE
50.0000 mg | ORAL_CAPSULE | Freq: Three times a day (TID) | ORAL | 0 refills | Status: DC | PRN
Start: 2022-11-01 — End: 2022-12-26

## 2022-11-01 NOTE — ED Triage Notes (Addendum)
Patient reports that she was awoken from rest around 4am with heart palpitation and back pains. Patient also reports "my feet freezing cold and numb". Patient reports recent UTI and stated that she completed abx as prescribed.

## 2022-11-01 NOTE — ED Attending Handoff Note (Signed)
[facility] Medicine [facility]  Emergency Department  Attending Course Note    Patient Name: Traci [name]  Age and Gender: 22 y.o. female  Date of Birth: 2001/02/15  Date of Service: 11/01/2022  MRN: [id number]  PCP: Peter Minium, MD    Care/report received from Dr. Earlene Plater, Isabelle Course, MD at  06:03.  Per report:  Palpitations that awoke her from sleep at 4am. 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 Takes Motegrity for endometriosis, recently increased.     Pending labs/imaging/consults: Labs  Plan: Pending labs, likely discharge    Course:  Patient improved after medications here.  No acute process identified.    Dispostion: Discharged    Clinical Impression:   Clinical Impression   Palpitations (Primary)     Parts of this patients chart were completed in a retrospective fashion due to simultaneous direct patient care activities in the Emergency Department.     Leana Roe, MD

## 2022-11-01 NOTE — ED Provider Notes (Signed)
[facility] Emergency Department        Chief Complaint:  Patient presents with     Chief Complaint   Patient presents with    Palpitations           HPI:   Traci [name], date of birth 06/28/2000, is a 22 y.o. female who presents to the Emergency Department via Car and is alone with a chief complaint of palpitations.  Symptoms woke her from sleep approximately an hour and a half prior to presentation.  She states it feels like her heart is racing and she states that both feet feel ice cold.  She also reports a sharp, stabbing pain in between her shoulder blades that does not radiate.  No chest pain or shortness of breath.  No lower extremity pain or edema.  She reports a history of similar palpitations twice in the past and states that both times she was found to have supraventricular tachycardia.  She states she has been evaluated by a cardiologist and her symptoms are currently being monitored.  She has not currently on any rate control medications.  She denies any dizziness or syncope.  No history of VTE.  She reports a history of anxiety with panic attacks, but states this does not feel like her typical panic attack.        History:   The following were reviewed: Medical History  Surgical History  Family History  Social History      Vital Signs:  Pre-disposition vitals:  ED Triage Vitals   BP (Non-Invasive) 11/01/22 0520 (!) 144/116   Heart Rate 11/01/22 0524 90   Respiratory Rate 11/01/22 0524 14   Temperature 11/01/22 0524 36 C (96.8 F)   SpO2 11/01/22 0524 100 %   Weight 11/01/22 0524 72.7 kg (160 lb 4.4 oz)   Height 11/01/22 0524 1.575 m (5\' 2" )         PE:   Nursing notes and vital signs reviewed.  Constitutional: 22 y.o. female appears stated age,  normal color.   HEENT:    Head:  Normocephalic and atraumatic.    Eyes:  EOMI   Mouth/Throat:  Mucous membranes moist.    Neck:  Trachea midline. Neck supple.   Cardiovascular:  RRR, no murmur appreciated. Intact distal pulses.  Pulmonary/Chest:    No respiratory distress. Breath sounds clear and equal bilaterally. No wheezes or rales. No chest wall tenderness to palpation.   Abdominal:  BS +. Abdomen soft, no tenderness, rebound or guarding.  Back:  No midline spinal tenderness, no paraspinal tenderness, no CVA tenderness.           Musculoskeletal:  No edema, tenderness or deformity noted.  Skin:  Warm and dry.   Psychiatric:  Appropriate mood and affect for situation. Behavior is normal.   Neurological:  Patient keenly alert and responsive, facies symmetric, moving all extremities equally and fully, SILT and equal bilaterally      Orders:  Orders Placed This Encounter    XR AP MOBILE CHEST    CBC/DIFF    COMPREHENSIVE METABOLIC PANEL, NON-FASTING    MAGNESIUM    PT/INR    PTT (PARTIAL THROMBOPLASTIN TIME)    TROPONIN-I    CBC WITH DIFF    EXTRA TUBES    THYROID STIMULATING HORMONE WITH FREE T4 REFLEX    URINALYSIS WITH REFLEX MICROSCOPIC AND CULTURE IF POSITIVE    HCG, URINE QUALITATIVE, PREGNANCY    RED TOP TUBE    URINALYSIS, MACRO/MICRO  THYROXINE, FREE (FREE T4)    PULSE OXIMETRY - CONTINUOUS    ECG 12 LEAD    CANCELED: ECG 12 LEAD    INSERT & MAINTAIN PERIPHERAL IV ACCESS    NS bolus infusion 1,000 mL    acetaminophen (TYLENOL) tablet         ED Course/Medical Decision Making:  Traci [name] is a 22 y.o. female who presents for evaluation of palpitations and thoracic back pain.  History of SVT.  Ddx includes, but is not limited to:  Dysrhythmia versus anemia versus anxiety versus PE  PERC negative  Appropriate labs/imaging ordered and reviewed/interpreted by myself during ED stay.  Most Recent EKG This Encounter   ECG 12 LEAD    Collection Time: 11/01/22  5:28 AM   Result Value    Ventricular rate 70    Atrial Rate 70    PR Interval 138    QRS Duration 88    QT Interval 372    QTC Calculation 401    Calculated P Axis 39    Calculated R Axis 68    Calculated T Axis 41    Narrative    Normal sinus rhythm  Normal ECG  When compared with ECG of  28-Apr-2022 01:52,  No significant change was found  Confirmed by Camille Bal ([id number]) on 11/01/2022 5:35:11 AM     ED Course as of 11/01/22 0624   Tue Nov 01, 2022   0534 Temperature: 36 C (96.8 F)   0535 BP (Non-Invasive)(!): 144/116   0535 Heart Rate: 90   0535 Respiratory Rate: 14   0535 SpO2: 100 %       Medications Administered in the ED   NS bolus infusion 1,000 mL (1,000 mL Intravenous New Bag/New Syringe 11/01/22 0539)   acetaminophen (TYLENOL) tablet (650 mg Oral Given 11/01/22 6045)     Medical Decision Making  Problems Addressed:  Palpitations: acute illness or injury that poses a threat to life or bodily functions    Amount and/or Complexity of Data Reviewed  External Data Reviewed: ECG.  Labs: ordered.  Radiology: ordered.  ECG/medicine tests: ordered and independent interpretation performed. Decision-making details documented in ED Course.            Procedures:    None.        Clinical Impression   Palpitations (Primary)         Disposition: Data Unavailable    Prior to disposition, care of [name] was checked out to Dr. Thomos Lemons following a discussion of the patient's course. They were made aware of history/physical, relevant labs/imaging and pending studies.        This chart may have been completed after the conclusion of this patient's care due to the time constraints of simultaneous responsiltibites of direct patient care activities during the clinical shift in the emergency department.     This note was partially generated using MModal Fluency Direct system, and there may be some incorrect words, spellings, and punctuation that were not noted in checking the note before saving.     Camille Bal, MD 11/01/2022, 06:24    Department of Emergency Medicine   [facility]

## 2022-11-01 NOTE — Discharge Instructions (Addendum)
Thank you for letting us care for you today. Please follow up with your PCP and return to the ED for high fevers, severe pain, nausea/vomiting not controlled at home, or other concerning symptoms.

## 2022-11-01 NOTE — ED Nurses Note (Signed)
Patient discharged home with family.  AVS reviewed with patient/care giver.  A written copy of the AVS and discharge instructions was given to the patient/care giver. Scripts handed to patient/care giver. Questions sufficiently answered as needed.  Patient/care giver encouraged to follow up with PCP as indicated.  In the event of an emergency, patient/care giver instructed to call 911 or go to the nearest emergency room.    Pt discharged to home per MD order.  VSS.

## 2022-11-02 ENCOUNTER — Telehealth (INDEPENDENT_AMBULATORY_CARE_PROVIDER_SITE_OTHER): Payer: Self-pay | Admitting: Family Medicine

## 2022-11-03 ENCOUNTER — Inpatient Hospital Stay (INDEPENDENT_AMBULATORY_CARE_PROVIDER_SITE_OTHER): Payer: Self-pay | Admitting: Physician Assistant

## 2022-11-07 ENCOUNTER — Inpatient Hospital Stay (INDEPENDENT_AMBULATORY_CARE_PROVIDER_SITE_OTHER): Payer: Self-pay | Admitting: Physician Assistant

## 2022-12-01 ENCOUNTER — Encounter (INDEPENDENT_AMBULATORY_CARE_PROVIDER_SITE_OTHER): Payer: Self-pay | Admitting: Family Medicine

## 2022-12-08 ENCOUNTER — Emergency Department
Admission: EM | Admit: 2022-12-08 | Discharge: 2022-12-08 | Disposition: A | Discharge status: Home / Self Care | Payer: Self-pay

## 2022-12-08 ENCOUNTER — Encounter (HOSPITAL_COMMUNITY): Payer: Self-pay

## 2022-12-08 ENCOUNTER — Other Ambulatory Visit: Payer: Self-pay

## 2022-12-08 DIAGNOSIS — R300 Dysuria: Secondary | ICD-10-CM | POA: Insufficient documentation

## 2022-12-08 DIAGNOSIS — Z3200 Encounter for pregnancy test, result unknown: Secondary | ICD-10-CM | POA: Insufficient documentation

## 2022-12-08 LAB — URINALYSIS, MACRO/MICRO
BILIRUBIN: NEGATIVE mg/dL
BLOOD: NEGATIVE mg/dL
GLUCOSE: NEGATIVE mg/dL
KETONES: NEGATIVE mg/dL
LEUKOCYTES: NEGATIVE WBCs/uL
NITRITE: NEGATIVE
PH: 5.5 (ref 5.0–9.0)
PROTEIN: NEGATIVE mg/dL
SPECIFIC GRAVITY: 1.027 (ref 1.001–1.030)
UROBILINOGEN: 0.2 mg/dL (ref 0.2–1.0)

## 2022-12-08 LAB — HCG, URINE QUALITATIVE, PREGNANCY
HCG URINE QUALITATIVE: NEGATIVE
SPECIFIC GRAVITY: 1.025 (ref 1.000–1.030)

## 2022-12-08 MED ORDER — NITROFURANTOIN MONOHYDRATE/MACROCRYSTALS 100 MG CAPSULE
100.0000 mg | ORAL_CAPSULE | Freq: Two times a day (BID) | ORAL | 0 refills | Status: AC
Start: 2022-12-08 — End: 2022-12-13

## 2022-12-08 MED ORDER — PHENAZOPYRIDINE 200 MG TABLET
200.0000 mg | ORAL_TABLET | Freq: Three times a day (TID) | ORAL | 0 refills | Status: DC | PRN
Start: 2022-12-08 — End: 2022-12-26

## 2022-12-08 NOTE — Discharge Instructions (Signed)
Take the Pyridium.  It will turn your urine bright orange.  Take the antibiotic for the next few days.  Contact your urologist for follow up.  Return to the ER with abdominal pain, fever, nausea, vomiting if symptoms worsen or with any other concern

## 2022-12-08 NOTE — ED Provider Notes (Signed)
[name]  Emergency Department Note      Attending ED APP:  Myna Bright PA-C  Name: Traci [name]  Age and Gender: 22 y.o. female  PCP: Traci Minium, MD      HPI/ED COURSE/MDM:     Traci [name], date of birth [birth year], is a 22 y.o. female who presents to the Emergency Department with a CC of Pain on Urination.  This is a 22 year old female with a history of recurrent urinary tract infections, cystitis and follows with [facility] currently presenting with an acute onset of back and lower abdominal pressure, dark urine.  She said she drinks about 100 oz of water daily so when her urine became dark with the symptoms she was suspicious of a possible urinary infection.  She states a low-grade fever of 100.1 last evening and 1 episode of vomiting both now resolved.  She denies chest pain or shortness of breath, diarrhea, URI symptoms cough or fever.  She presents this time for evaluation.        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    PONV (postoperative nausea and vomiting)     Smoker     vapes with nicotine    UTI (urinary tract infection)     Vitamin D deficiency      Past Surgical History:   Procedure Laterality Date    FOOT SURGERY Right     HX CHOLECYSTECTOMY      HX DENTAL EXTRACTION      HX FOOT SURGERY      HX OTHER  10/30/2015    DIAG LAPARO SEPARATE PROC - fulgration of endometriotic implants    HX OTHER      ORIF Right foot    HX TONSIL AND ADENOIDECTOMY      HX TONSILLECTOMY      LAPAROSCOPIC  ENDOMETRIOSIS FULGURATION      LAPAROSCOPIC ENDOMETRIOSIS FULGURATION      2nd surgery was to also drain a blood clot.         Social History     Socioeconomic History    Marital status: Single   Tobacco Use    Smoking status: Never    Smokeless tobacco: Never   Vaping Use    Vaping status: Every Day    Substances: Nicotine    Devices: Pre-filled pod   Substance and Sexual Activity    Alcohol use: Yes     Comment: occasionally    Drug use: Yes     Types: Marijuana     Comment: has medical marijuana card; smokes marijuana  2 times a day    Sexual activity: Yes     Birth control/protection: Condom     Social Determinants of Health  Financial Resource Strain: Low Risk  (12/17/2021)    Financial Resource Strain     SDOH Financial: No   Transportation Needs: Low Risk  (12/17/2021)    Transportation Needs     SDOH Transportation: No   Social Connections: Medium Risk (12/17/2021)    Social Connections     SDOH Social Isolation: 3 to 5 times a week   Intimate Partner Violence: Low Risk  (12/17/2021)    Intimate Partner Violence     SDOH Domestic Violence: No   Housing Stability: Low Risk  (12/17/2021)    Housing Stability     SDOH Housing Situation: I have housing.     SDOH Housing Worry: No     Family History   Problem Relation Name Age of Onset    Thyroid Disease Mother      Hypertension (High Blood Pressure) Mother      Migraines Mother      No Known Problems Father      Alopecia Sister      COLITIS Brother       Current Outpatient Medications   Medication Sig    ergocalciferol, vitamin D2, (VITAMIN D2 ORAL) Take 5,000 mg by mouth Once a day morning    hydrOXYzine pamoate (VISTARIL) 25 mg Oral Capsule Take 2 Capsules (50 mg total) by mouth Three times a day as needed for Anxiety    prucalopride (MOTEGRITY) 1 mg Oral Tablet Take 50 Tablets (50 mg total) by mouth Once a day Takes after first meal of day     Review of Systems   Constitutional:  Negative for chills and fever.   HENT:  Negative for ear pain and sore throat.     Eyes:  Negative for pain and visual disturbance.   Respiratory:  Negative for cough and shortness of breath.    Cardiovascular:  Negative for chest pain and palpitations.   Gastrointestinal:  Negative for abdominal pain and vomiting.   Genitourinary:  Positive for pelvic pain (Pressure). Negative for dysuria and hematuria.   Musculoskeletal:  Positive for back pain. Negative for arthralgias.   Skin:  Negative for color change and rash.   Allergic/Immunologic: Negative for environmental allergies and food allergies.   Neurological:  Negative for seizures and syncope.   All other systems reviewed and are negative.        Medications given during ED stay include:           PHYSICAL EXAM:     ED Triage Vitals   BP (Non-Invasive) 12/08/22 0815 128/81   Heart Rate 12/08/22 0815 78   Respiratory Rate 12/08/22 0815 18   Temperature 12/08/22 0815 36.6 C (97.9 F)   SpO2 12/08/22 0815 98 %   Weight 12/08/22 0813 72.6 kg (160 lb)   Height 12/08/22 0813 1.575 m (5\' 2" )     Physical Exam  Vitals and nursing note reviewed.   Constitutional:       General: She is not in acute distress.     Appearance: Normal appearance. She is not ill-appearing, toxic-appearing or diaphoretic.   HENT:      Head: Normocephalic and atraumatic.      Right Ear: External ear normal.      Left Ear: External ear normal.      Nose: Nose normal.      Mouth/Throat:      Mouth: Mucous membranes are moist.   Eyes:      General: No scleral icterus.     Conjunctiva/sclera: Conjunctivae normal.  Cardiovascular:      Rate and Rhythm: Normal rate and regular rhythm.      Heart sounds: No murmur heard.     No friction rub. No gallop.   Pulmonary:      Effort: Pulmonary effort is normal.      Breath sounds: Normal breath sounds. No wheezing, rhonchi or rales.   Abdominal:      General: Abdomen is flat. Bowel sounds are normal.      Palpations: Abdomen is soft. There is no mass.      Tenderness: There is abdominal tenderness in the suprapubic area. There is no  right CVA tenderness, left CVA tenderness, guarding or rebound.   Musculoskeletal:         General: Normal range of motion.      Cervical back: Normal range of motion and neck supple.   Lymphadenopathy:      Cervical: No cervical adenopathy.   Skin:     General: Skin is warm and dry.      Capillary Refill: Capillary refill takes less than 2 seconds.   Neurological:      General: No focal deficit present.      Mental Status: She is alert and oriented to person, place, and time.   Psychiatric:         Mood and Affect: Mood normal.         Behavior: Behavior normal.         DIAGNOSTICS   I have personally reviewed all laboratory and imaging results for this patient.  Results are listed below.    Labs:    Labs Ordered/Reviewed   HCG, URINE QUALITATIVE, PREGNANCY - Normal    Narrative:     Sensitivity of the qualitative pregnancy test is 20 mIU hCG/mL.  If pregnancy is strongly considered, perform quantitative hCG or repeat in 48 hours.   URINALYSIS, MACRO/MICRO - Normal   URINALYSIS WITH REFLEX MICROSCOPIC AND CULTURE IF POSITIVE    Narrative:     The following orders were created for panel order URINALYSIS WITH REFLEX MICROSCOPIC AND CULTURE IF POSITIVE.                  Procedure                               Abnormality         Status                                     ---------                               -----------         ------                                     URINALYSIS, MACRO/MICRO[[id number]]      Normal              Final result  Please view results for these tests on the individual orders.         Radiology:    No orders to display       EKG:  Reviewed and interpreted by ED physician  No results found for this visit on 12/08/22 (from the past 720 hour(s)).    ED Course   ED Course:  Relevant prior external notes and tests were reviewed by myself if available.  During the patient's stay in the emergency department, the above listed imaging and/or labs were  performed to assist with medical decision making and were reviewed by myself when available for review and attending consulted when appropriate.  Patient rechecked throughout remainder of emergency department course.   All questions/concerns addressed to the best of my ability.            Medical Decision Making  This is a 22 year old female presenting with lower abdominal pain and pressure, back pain with a history of cystitis and recurrent urinary tract infections.  Urinalysis and pregnancy test are sent.  Patient currently hemodynamically stable with no abnormal vital signs to suggest sepsis.    The patient's urine is reviewed and is normal.  I discussed the situation further with the patient and she has never had the symptoms with out urinary tract infection.  We discussed trying some Pyridium as well as a very short course of Macrobid which it sounds like she was on previously as a prophylactic.  I would encourage her to call her urologist.  She is not having symptoms otherwise of other pelvic infection.  She is hemodynamically stable for discharge.    Amount and/or Complexity of Data Reviewed  ECG/medicine tests: independent interpretation performed.      ED Course as of 12/08/22 0910   Thu Dec 08, 2022   0905 NITRITE: Negative   0905 LEUKOCYTES: Negative   0905 PREGNANCY, URINE: Negative                 Clinical Impression   Dysuria (Primary)         Procedures:   Procedures      IMPRESSION AND DISPOSITION     Clinical Impression:   Clinical Impression   Dysuria (Primary)          Disposition: Data Unavailable      No follow-up provider specified.    Current Discharge Medication List        Myna Bright MPA, PA-C    Parts of this patients chart were completed in a retrospective fashion due to simultaneous direct patient care activities in the Emergency Department. This note was partially generated using MModal Fluency Direct System, and there may be some incorrect words, spellings, and punctuation that were  not noted in proofreading the note prior to saving.

## 2022-12-08 NOTE — ED Triage Notes (Signed)
Pt c/o pain and burning with urination.

## 2022-12-16 ENCOUNTER — Inpatient Hospital Stay (INDEPENDENT_AMBULATORY_CARE_PROVIDER_SITE_OTHER): Payer: Self-pay | Admitting: Internal Medicine

## 2022-12-16 NOTE — Progress Notes (Deleted)
PRIMARY CARE, [facility]  [address]  [place] [address]      Chief Complaint:  No chief complaint on file.    HPI:  Patient presents for follow-up from an ED visit completed 12/08/2022.  Presented with complaints of dysuria with back and lower abdominal pressure.  She had low-grade fever the evening prior in 1 episode of vomiting.  Treated with Pyridium and Macrobid.  Advised to contact her urologist for follow-up.    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    PONV (postoperative nausea and vomiting)     Smoker     vapes with nicotine    UTI (urinary tract infection)     Vitamin D deficiency      Past Surgical History:   Procedure Laterality Date    FOOT SURGERY Right     HX CHOLECYSTECTOMY      HX DENTAL EXTRACTION      HX FOOT SURGERY      HX OTHER  10/30/2015    DIAG LAPARO SEPARATE PROC - fulgration of endometriotic implants    HX OTHER      ORIF Right foot    HX TONSIL AND ADENOIDECTOMY      HX TONSILLECTOMY      LAPAROSCOPIC ENDOMETRIOSIS FULGURATION      LAPAROSCOPIC ENDOMETRIOSIS FULGURATION      2nd surgery was to also drain a blood clot.     Family Medical History:       Problem Relation (Age of Onset)    Alopecia Sister    COLITIS Brother    Hypertension (High Blood Pressure) Mother    Migraines Mother    No Known Problems Father    Thyroid Disease Mother          Social History     Tobacco Use    Smoking status: Never    Smokeless tobacco: Never   Vaping Use    Vaping status: Every Day    Substances:  Nicotine    Devices: Pre-filled pod   Substance Use Topics    Alcohol use: Yes     Comment: occasionally    Drug use: Yes     Types: Marijuana     Comment: has medical marijuana card; smokes marijuana  2 times a day     Allergies   Allergen Reactions    Sulfamethoxazole-Trimethoprim Hives/ Urticaria     Current Outpatient Medications   Medication Sig    ergocalciferol, vitamin D2, (VITAMIN D2 ORAL) Take 5,000 mg by mouth Once a day morning    hydrOXYzine pamoate (VISTARIL) 25 mg Oral Capsule Take 2 Capsules (50 mg total) by mouth Three times a day as needed for Anxiety    phenazopyridine (PYRIDIUM) 200 mg Oral  Tablet Take 1 Tablet (200 mg total) by mouth Three times a day as needed for Pain    prucalopride (MOTEGRITY) 1 mg Oral Tablet Take 50 Tablets (50 mg total) by mouth Once a day Takes after first meal of day     Labs completed 12/08/2022 with urine pregnancy test negative.  UA negative.  Urine culture negative.

## 2022-12-22 ENCOUNTER — Encounter (INDEPENDENT_AMBULATORY_CARE_PROVIDER_SITE_OTHER): Payer: Self-pay | Admitting: Family Medicine

## 2022-12-26 ENCOUNTER — Emergency Department
Admission: EM | Admit: 2022-12-26 | Discharge: 2022-12-26 | Disposition: A | Discharge status: Home / Self Care | Payer: Worker's Compensation | Attending: PHYSICIAN ASSISTANT | Admitting: PHYSICIAN ASSISTANT

## 2022-12-26 ENCOUNTER — Encounter (HOSPITAL_COMMUNITY): Payer: Self-pay | Admitting: PHYSICIAN ASSISTANT

## 2022-12-26 ENCOUNTER — Other Ambulatory Visit: Payer: Self-pay

## 2022-12-26 ENCOUNTER — Emergency Department (HOSPITAL_COMMUNITY): Payer: Worker's Compensation

## 2022-12-26 DIAGNOSIS — M25562 Pain in left knee: Secondary | ICD-10-CM | POA: Insufficient documentation

## 2022-12-26 DIAGNOSIS — M7989 Other specified soft tissue disorders: Secondary | ICD-10-CM | POA: Insufficient documentation

## 2022-12-26 DIAGNOSIS — W010XXA Fall on same level from slipping, tripping and stumbling without subsequent striking against object, initial encounter: Secondary | ICD-10-CM | POA: Insufficient documentation

## 2022-12-26 DIAGNOSIS — W1830XA Fall on same level, unspecified, initial encounter: Secondary | ICD-10-CM

## 2022-12-26 DIAGNOSIS — Y990 Civilian activity done for income or pay: Secondary | ICD-10-CM

## 2022-12-26 MED ORDER — OXYCODONE-ACETAMINOPHEN 5 MG-325 MG TABLET
1.0000 | ORAL_TABLET | Freq: Four times a day (QID) | ORAL | 0 refills | Status: AC | PRN
Start: 2022-12-26 — End: 2022-12-28

## 2022-12-26 MED ORDER — OXYCODONE-ACETAMINOPHEN 5 MG-325 MG TABLET - ED TO GO MED
1.0000 | ORAL_TABLET | Freq: Four times a day (QID) | ORAL | Status: DC | PRN
Start: 2022-12-26 — End: 2022-12-26
  Administered 2022-12-26: 2 via ORAL
  Filled 2022-12-26: qty 2

## 2022-12-26 MED ORDER — IBUPROFEN 600 MG TABLET
600.0000 mg | ORAL_TABLET | Freq: Four times a day (QID) | ORAL | 0 refills | Status: DC | PRN
Start: 2022-12-26 — End: 2024-02-09

## 2022-12-26 MED ORDER — ACETAMINOPHEN 325 MG TABLET
975.0000 mg | ORAL_TABLET | ORAL | Status: AC
Start: 2022-12-26 — End: 2022-12-26
  Administered 2022-12-26: 975 mg via ORAL
  Filled 2022-12-26: qty 3

## 2022-12-26 NOTE — ED Triage Notes (Signed)
C/o left knee pain , tripped at work,

## 2022-12-26 NOTE — ED Nurses Note (Signed)
I have given crutches to the patient, adjusted them and provided complete instructions on safe use. Knee immobilizer applied to left leg.

## 2022-12-26 NOTE — Discharge Instructions (Addendum)
Thank you for letting us care for you today. Your workup today did not reveal a problem that required surgery or admission to the hospital.   Please follow up with your primary care provider and orthopedic provider. .   Please return to the ED for new or worsening symptoms including but not limited to shortness of breath, chest pain, high fevers, severe pain, nausea/vomiting not controlled at home, inability to tolerate clear liquids, or other concerning symptoms.     If you are able to take acetaminophen and ibuprofen, you may use these medications as needed for pain every 6-8 hours.  Your prescribed pain medication also has acetaminophen in it. Be careful not take seen maximum daily recommend dosages of these medications.      You should also apply ice packs 4-6 times per day to affected area for 20 minutes at a time.  Be sure to use towels or other cloth in between ice and skin to prevent skin damage.  You may also use an Ace wrap for support and compression of injury to area.  Be sure to take this off once or twice a day to inspect skin underneath.  Would recommend leaving it off at night while sleeping.    You should rest injured areas much as possible and when you are able elevate above heart level to improve swelling.    You have been prescribed opiate pain medication for your condition.     Opioids are highly addictive, even when taken as prescribed, that there is a risk of developing a physical or psychological dependence on the controlled substance, and that the risks of taking more opioids than prescribed, or mixing sedatives, benzodiazepines, or alcohol with opioids, can result in fatal respiratory depression.     These medications are known to have severe side effects please review education information provided.  Some side effects include but not limited to: excessive sleepiness/drowsiness, decreased respirations resulting in fatal lung and heart complications, and addiction.  Please only take this  medication as prescribed.  Please do not take this medication with other medications known to cause drowsiness.  Do not take this medication with other drugs or alcohol.  Do not take this medication if you need to perform tasks that require mental alertness including driving or caring for others.    You can fill the prescription for a lesser amount if you choose. If you choose to partially fill the prescription you might not be able to get an additional opoid prescription for several days if you run out of medication.

## 2022-12-26 NOTE — ED Nurses Note (Signed)

## 2022-12-26 NOTE — ED Provider Notes (Signed)
[facility]           Name: Traci [name]  Age and Gender: 22 y.o. female  PCP: Peter Minium, MD    Chief Complaint:  Patient presents with     Chief Complaint   Patient presents with    Knee Pain       HPI      Knee Pain       Traci [name], date of birth [birth year], is a 22 y.o. female who presents to the Emergency Department from work by [employer] with a chief complaint of left knee pain following slip and fall at work.  Patient was reports falling forward on flexed knee.  Patient reports pain and swelling.  No interventions tried prior to come in the emergency department.      Other than noted above, review of systems obtained and negative.      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    PONV (postoperative nausea and vomiting)     Smoker     vapes with nicotine    UTI (urinary tract infection)     Vitamin D deficiency        Past Surgical History:  Past Surgical History:   Procedure Laterality Date    Foot surgery Right     Hx cholecystectomy      Hx dental extraction      Hx foot surgery      Hx other  10/30/2015    Hx other      Hx tonsil and adenoidectomy      Hx tonsillectomy      Laparoscopic endometriosis fulguration      Laparoscopic endometriosis fulguration         Family History:   Family History   Problem Relation Age of Onset    Thyroid Disease Mother     Hypertension (High Blood  Pressure) Mother     Migraines Mother     No Known Problems Father     Alopecia Sister     COLITIS Brother        Social History     Social History     Tobacco Use    Smoking status: Never    Smokeless tobacco: Never   Vaping Use    Vaping status: Every Day    Substances: Nicotine    Devices: Pre-filled pod   Substance Use Topics    Alcohol use: Yes     Comment: occasionally    Drug use: Yes     Types: Marijuana     Comment: has medical marijuana card; smokes marijuana  2 times a day       Social History     Substance and Sexual Activity   Drug Use Yes    Types: Marijuana    Comment: has medical marijuana card; smokes marijuana  2 times a day       Peter Minium, MD    Allergies   Allergen Reactions    Sulfamethoxazole-Trimethoprim Hives/ Urticaria       Outpatient Medications Marked as Taking for the 12/26/22 encounter [facility] Encounter)   Medication Sig    ergocalciferol, vitamin D2, (VITAMIN D2 ORAL) Take 5,000 mg by mouth Once a day morning    Ibuprofen (MOTRIN) 600 mg Oral Tablet Take 1 Tablet (600 mg total) by mouth Four times a day as needed for Pain    oxyCODONE-acetaminophen (PERCOCET) 5-325 mg Oral Tablet Take 1 Tablet by mouth Every 6 hours as needed for Pain for up to 2 days    prucalopride (MOTEGRITY) 1 mg Oral Tablet Take 50 Tablets (50 mg total) by mouth Once a day Takes after first meal of day       PE:   ED Triage Vitals [12/26/22 1434]   BP (Non-Invasive) 121/89   Heart Rate 89   Resp    Temperature 36.7 C (98.1 F)   SpO2 100 %   Weight 72.6 kg (160 lb)   Height      Physical Exam  Vitals and nursing note reviewed.   Constitutional:       Appearance: Normal appearance.   Cardiovascular:      Pulses:           Dorsalis pedis pulses are 2+ on the right side and 2+ on the left side.   Musculoskeletal:      Left knee: Swelling present. No deformity, erythema, ecchymosis or lacerations. Decreased range of motion. Tenderness present over the medial joint line and patellar tendon. Normal patellar  mobility.      Instability Tests: Anterior drawer test positive.   Skin:     Capillary Refill: Capillary refill takes less than 2 seconds.   Neurological:      General: No focal deficit present.      Mental Status: She is alert and oriented to person, place, and time.      Sensory: Sensation is intact.      Motor: Motor function is intact.   Psychiatric:         Behavior: Behavior normal.           DDx:  Differential diagnosis includes, but is not limited to knee fracture, patellar dislocation, knee sprain, knee contusion    Initial workup:  Physical exam orders as below    Orders:  Orders Placed This Encounter    XR KNEE LEFT 4 OR MORE VIEWS    acetaminophen (TYLENOL) tablet    Ibuprofen (MOTRIN) 600 mg Oral Tablet    oxyCODONE-acetaminophen (PERCOCET) 5-325 mg Oral Tablet           Diagnostics:  I have personally reviewed all laboratory and imaging results for this patient.  Results are as listed below.  Labs:  No results found for this or any previous visit (from the past 12 hour(s)).  Labs reviewed and interpreted by me.    Radiology:    XR KNEE LEFT 4 OR MORE VIEWS   Final Result by Edi, Radresults In (09/02 1601)   No acute osseous findings.         Signed by Rolin Barry, MD  EKG:  No results found for this visit on 12/26/22 (from the past 720 hour(s)).    Medical Decision Making  Amount and/or Complexity of Data Reviewed  Radiology:  Decision-making details documented in ED Course.    Risk  OTC drugs.  Prescription drug management.     patient reporting follow up work landing on left knee flex and 4 position.  She denies twisting injury.  Patient was having pain with weight-bearing and range of motion limited mainly due to swelling.  Otherwise mood motor function appears intact.  Distal motor function intact distal sensation intact.  Capillary refill intact and pulse 2+.  Patient was only wanting and Tylenol for pain at this time.  We will provide medication and ice decrease pain and swelling  waiting on x-ray results.  Patient was denies chance of pregnancy menstrual cycle ended yesterday and declines pregnancy testing prior to evaluation and treatment.  Risks and benefits discussed along with medication, radiation exposure notes and NSAID use while pregnant.  Patient was verbalized understanding and declined hCG testing.    No acute findings on x-ray.  Patient was follow up with Dr. Alonza Bogus, patient recommend to follow up with orthopedic wound proximally one-week.  We will place patient in knee immobilizer and crutches, recommend nonweightbearing, rice, over-the-counter pain relievers in conjunction with prescribed medication.  Patient was verbalized understanding of discharge plan, follow up and ER return precautions.  All questions answered to patient's satisfaction.      During the patient's stay in the emergency department, the listed imaging and/or labs were performed to assist with medical decision making and were reviewed by myself when available for review.     ED Course:  ED Course as of 12/26/22 2018   Mon Dec 26, 2022   1628 XR KNEE LEFT 4 OR MORE VIEWS  IMPRESSION:  No acute osseous findings.         Medications given during ED stay include:  Medications Administered in the ED   acetaminophen (TYLENOL) tablet (975 mg Oral Given 12/26/22 1543)         During the patient's stay in the emergency department, the above listed imaging and/or labs were performed to assist with medical decision making and were reviewed by myself when available for review.   Relevant prior external notes and tests were reviewed by myself if available.  Patient rechecked and remained stable throughout remainder of emergency department course.   All questions/concerns addressed, and patient agrees with disposition plan.    Discharge Medication List as of 12/26/2022  5:01 PM        START taking these medications    Details   Ibuprofen (MOTRIN) 600 mg Oral Tablet Take 1 Tablet (600 mg total) by mouth Four times a day as  needed for Pain, Disp-30 Tablet, R-0, E-Rx      oxyCODONE-acetaminophen (PERCOCET) 5-325 mg Oral Tablet Take 1 Tablet by mouth Every 6 hours as needed for Pain for up to 2 days, Disp-8 Tablet, R-0, E-Rx             Clinical Impression:   Clinical Impression   Acute pain of left knee (Primary)       Disposition: Discharged        // Lorenza Burton, FNP-C 12/26/2022, 20:09   [facility], [facility] Emergency Department    Parts of this patients chart were completed in a retrospective fashion due to simultaneous direct patient care activities in the Emergency Department.   This note  was partially generated using MModal Fluency Direct System, and there may be some incorrect words, spellings, and punctuation that were not noted in proofreading the note prior to saving.

## 2022-12-27 ENCOUNTER — Telehealth (INDEPENDENT_AMBULATORY_CARE_PROVIDER_SITE_OTHER): Payer: Self-pay | Admitting: Family Medicine

## 2022-12-27 NOTE — Telephone Encounter (Signed)
Left message for patient to call and schedule er follow up within 10- days

## 2022-12-30 ENCOUNTER — Encounter (INDEPENDENT_AMBULATORY_CARE_PROVIDER_SITE_OTHER): Payer: Self-pay | Admitting: Family Medicine

## 2023-01-04 ENCOUNTER — Encounter (INDEPENDENT_AMBULATORY_CARE_PROVIDER_SITE_OTHER): Payer: Self-pay | Admitting: Family Medicine

## 2023-02-02 ENCOUNTER — Other Ambulatory Visit: Payer: Self-pay

## 2023-02-02 ENCOUNTER — Encounter (HOSPITAL_COMMUNITY): Payer: Self-pay

## 2023-02-02 ENCOUNTER — Emergency Department (HOSPITAL_COMMUNITY): Payer: Self-pay

## 2023-02-02 ENCOUNTER — Emergency Department
Admission: EM | Admit: 2023-02-02 | Discharge: 2023-02-02 | Disposition: A | Discharge status: Home / Self Care | Payer: Self-pay | Attending: Medical | Admitting: Medical

## 2023-02-02 DIAGNOSIS — Z9181 History of falling: Secondary | ICD-10-CM | POA: Insufficient documentation

## 2023-02-02 DIAGNOSIS — M25562 Pain in left knee: Secondary | ICD-10-CM | POA: Insufficient documentation

## 2023-02-02 NOTE — Discharge Instructions (Signed)
Call Dr Lloyd Huger for a follow up appointment

## 2023-02-02 NOTE — ED Nurses Note (Signed)
Patient discharged home with family.  AVS reviewed with patient/care giver.  A written copy of the AVS and discharge instructions was given to the patient/care giver.  Questions sufficiently answered as needed.  Patient/care giver encouraged to follow up with PCP as indicated.  In the event of an emergency, patient/care giver instructed to call 911 or go to the nearest emergency room.    Tanishia Lemaster L. Fiorela Pelzer, LPN

## 2023-02-02 NOTE — ED Triage Notes (Signed)
"  Left knee - cortisone injection done today by ortho, now pain entire left  leg, getting worse"

## 2023-02-02 NOTE — ED Provider Notes (Signed)
[facility]       Attending ED APP:  Gerhard Munch. Elmon Kirschner, [place]  Name: Traci [name]  Age and Gender: 22 y.o. female  PCP: Peter Minium, MD    History of Present Illness      Chief Complaint   Patient presents with    Knee Pain       Traci [name], date of birth 07-Dec-2000, is a 22 y.o. female who arrived by Car    HPI:  Larey Seat at work several months ago and has been following with Dr Lloyd Huger.  She received a cortisone injection today, went to work 2 hours later and developed excruciating pain to the knee and had to leave work.  Has taken Motrin and applied ice with no relief.  This was her 4th injection and no prior side effects.  Ambulating with no difficulty, no sob, CP, or difficulty breathing.    Review of Systems   All other systems reviewed and are negative.         Historical Data    History Reviewed This Encounter: Medical History  Surgical History  Family History  Social History     Past Medical History:  Diagnosis  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    PONV (postoperative nausea and vomiting)     Smoker     vapes with nicotine    UTI (urinary tract infection)     Vitamin D deficiency        Past Surgical History:  Past Surgical History:   Procedure Laterality Date    Foot surgery Right     Hx cholecystectomy      Hx dental extraction      Hx foot surgery      Hx other  10/30/2015    Hx other      Hx tonsil and adenoidectomy      Hx tonsillectomy      Laparoscopic endometriosis fulguration      Laparoscopic  endometriosis fulguration         Family History:   Family History   Problem Relation Age of Onset    Thyroid Disease Mother     Hypertension (High Blood Pressure) Mother     Migraines Mother     No Known Problems Father     Alopecia Sister     COLITIS Brother        Social History  Social History     Tobacco Use    Smoking status: Never    Smokeless tobacco: Never   Vaping Use    Vaping status: Every Day    Substances: Nicotine    Devices: Pre-filled pod   Substance Use Topics    Alcohol use: Yes     Comment: occasionally  Drug use: Not Currently     Types: Marijuana     Comment: has medical marijuana card; smokes marijuana  2 times a day       Social History     Substance and Sexual Activity   Drug Use Not Currently    Types: Marijuana    Comment: has medical marijuana card; smokes marijuana  2 times a day       Allergies   Allergen Reactions    Sulfamethoxazole-Trimethoprim Hives/ Urticaria       No outpatient medications have been marked as taking for the 02/02/23 encounter [facility] Encounter).           Physical Exam    ED Triage Vitals   BP (Non-Invasive) 02/02/23 2122 (!) 151/97   Heart Rate 02/02/23 2121 95   Respiratory Rate 02/02/23 2121 (!) 22   Temperature 02/02/23 2121 36.8 C (98.2 F)   SpO2 02/02/23 2121 100 %   Weight 02/02/23 2121 70.3 kg (155 lb)   Height 02/02/23 2121 1.575 m (5\' 2" )       Physical Exam  Constitutional:       Appearance: Normal appearance. She is obese.   Cardiovascular:      Pulses: Normal pulses.   Musculoskeletal:         General: Tenderness and signs of injury present. No swelling. Normal range of motion.        Legs:       Comments: Injection site with no swelling, erythema, warmth or TTP.   Skin:     General: Skin is warm.      Capillary Refill: Capillary refill takes less than 2 seconds.      Findings: No bruising or erythema.   Neurological:      General: No focal deficit present.      Mental Status: She is alert and oriented to person, place, and time.      Cranial  Nerves: No cranial nerve deficit.      Sensory: No sensory deficit.   Psychiatric:         Mood and Affect: Mood normal.         Behavior: Behavior normal.            DDx:  Differential diagnosis includes, but is not limited to side effect, effusion, cellulitis, septic joint      Orders Placed This Encounter    XR KNEE LEFT 2 VIEW         Patient Data    Labs and x-rays reviewed by myself  No results found for this or any previous visit (from the past 12 hour(s)).     XR KNEE LEFT 2 VIEW   Final Result by Edi, Radresults In (10/10 2215)   No acute bony abnormalities.  There is no definite change when compared to the prior exam..         Signed by Lysle Morales, MD              EKG:  No results found for this visit on 02/02/23 (from the past 720 hour(s)).      ED Course:  Relevant prior external notes and tests were reviewed by myself if available.  During the patient's stay in the emergency department, the above listed imaging and/or labs were performed to assist with medical decision making and were reviewed by myself when available for review and attending consulted when appropriate.  Patient rechecked throughout remainder of emergency department course.   All questions/concerns  addressed to the best of my ability.    ED Course as of 02/03/23 0129   Thu Feb 02, 2023   2219 IMPRESSION:  No acute bony abnormalities.  There is no definite change when compared to the prior exam..        Signed by Lysle Morales, MD           Medical Decision Making         Medical Decision Making  22 yo WF A&O x 3 NAD, VSS.  Physical exam not impressive no changes in skin color, effusion, FROM.  Xray ordered in Triage neg per Radiologist.  Needs a work slip.  To continue ice and motrin, return to ED with any new or worsening symptoms.    Amount and/or Complexity of Data Reviewed  Radiology: ordered. Decision-making details documented in ED Course.            Clinical Impression:  Clinical Impression   Left knee pain (Primary)           Disposition:  Discharged       Marye Round, DO  [address]  [place] [address]  [phone]            Discharge Medication List as of 02/02/2023 11:11 PM             //  Cala Bradford B. Athena Masse , 22:31     [facility], [facility] Emergency Department     The co-signing faculty was physically present in the emergency department and available for consultation and did not participate in the care of this patient.    This chart may have been completed after the conclusion of this patient's care due to the time constraints of simultaneous responsibilities of direct patient care activities during my clinical shift in the emergency department.     This note was partially generated using MModal Fluency Direct system, and there may be some incorrect words, spellings, and punctuation's that were not noted in checking the note before saving.    54}

## 2023-02-03 ENCOUNTER — Encounter (HOSPITAL_COMMUNITY): Payer: Self-pay | Admitting: Medical

## 2023-02-06 ENCOUNTER — Telehealth (INDEPENDENT_AMBULATORY_CARE_PROVIDER_SITE_OTHER): Payer: Self-pay | Admitting: Family Medicine

## 2023-03-31 ENCOUNTER — Telehealth: Payer: Self-pay | Admitting: Family

## 2023-03-31 DIAGNOSIS — K581 Irritable bowel syndrome with constipation: Secondary | ICD-10-CM

## 2023-03-31 DIAGNOSIS — K5900 Constipation, unspecified: Secondary | ICD-10-CM

## 2023-03-31 MED ORDER — LACTULOSE 10 GRAM/15 ML ORAL SOLUTION
30.0000 mL | Freq: Every day | ORAL | 0 refills | Status: DC
Start: 2023-03-31 — End: 2023-04-05

## 2023-03-31 NOTE — Progress Notes (Signed)
PRIMARY CARE, [facility]  [address] [place] [address]    Progress Note    Encounter Date: 03/31/2023    Patient ID:  Traci [name]  SVX:[id number]    DOB: March 10, 2001  Age: 22 y.o. female    PRIMARY CARE, [facility]  [address] [place] [address]    Video Visit     Name: Traci [name]  MRN: [id number]    Date: 03/31/2023  Age: 22 y.o.                            Patient's location: Home Traci [name] [address]   Patient/family aware of provider location: Yes  Patient/family consent for video visit: Yes  Interview and observation performed by: Paschal Dopp, CRNP        Subjective   Subjective: acute with constipation.    No chief complaint on file.    HPI    The patient is doing a video visit for constipation.  Reports that she has not moved her bowels in about 11 days. Reports she does have IBS with constipation.  Has tried Miralax and OTC suppository without any relief.  Reports some nausea but no vomiting. She is drinking. She is getting full fast when eating.  Denies any abdominal pain.    Current Outpatient Medications   Medication Sig    ergocalciferol, vitamin D2, (VITAMIN D2 ORAL) Take 5,000 mg by mouth Once a day morning    Ibuprofen (MOTRIN) 600 mg Oral Tablet Take 1 Tablet (600 mg total) by mouth Four times a day as needed for Pain    prucalopride (MOTEGRITY) 1 mg Oral Tablet Take 50 Tablets (50 mg total) by mouth Once a day Takes after first meal of day     Allergies   Allergen Reactions    Sulfamethoxazole-Trimethoprim Hives/ Urticaria     Past Medical History:   Diagnosis Date    Anxiety     Chronic nausea     Constipation     Depression     Endometriosis     Foot fracture     Right    Gastroesophageal reflux disease 02/19/2014    HA (headache)     Hashimoto's thyroiditis     Hemorrhage of right ovary 02/19/2015    History of anesthesia complications     takes a while to wake up    Hypothyroidism     Irritable bowel syndrome     Migraine      Obesity     Panic attack     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 after surgery    PONV (postoperative nausea and vomiting)     Smoker     vapes with nicotine    UTI (urinary tract infection)     Vitamin D deficiency          Past Surgical History:   Procedure Laterality Date    FOOT SURGERY Right     HX CHOLECYSTECTOMY      HX DENTAL EXTRACTION      HX FOOT SURGERY      HX OTHER  10/30/2015    DIAG LAPARO SEPARATE PROC - fulgration of endometriotic implants    HX OTHER      ORIF Right foot    HX TONSIL AND ADENOIDECTOMY      HX TONSILLECTOMY  LAPAROSCOPIC ENDOMETRIOSIS FULGURATION      LAPAROSCOPIC ENDOMETRIOSIS FULGURATION      2nd surgery was to also drain a blood clot.         Family Medical History:       Problem Relation (Age of Onset)    Alopecia Sister    COLITIS Brother    Hypertension (High Blood Pressure) Mother    Migraines Mother    No Known Problems Father    Thyroid Disease Mother            Social History     Tobacco Use    Smoking status: Never    Smokeless tobacco: Never   Vaping Use    Vaping status: Every Day    Substances: Nicotine    Devices: Pre-filled pod   Substance Use Topics    Alcohol use: Yes     Comment: occasionally    Drug use: Not Currently     Types: Marijuana     Comment: has medical marijuana card; smokes marijuana  2 times a day       Review of Systems   Constitutional:  Negative for fever.   Respiratory:  Negative for shortness of breath.    Gastrointestinal:  Positive for constipation and nausea. Negative for abdominal distention, diarrhea and vomiting.   Neurological:  Negative for dizziness and headaches.        Objective   Objective:   Vitals: There were no vitals taken for this visit.        Physical Exam  Constitutional:       Appearance: Normal appearance.   Neurological:      Mental Status: She is alert.   Psychiatric:         Attention and Perception: Attention and perception normal.          Assessment & Plan:   (K59.00) Constipation,  unspecified constipation type  (primary encounter diagnosis)  Plan: XR KUB AND UPRIGHT ABDOMEN [IMG1960]          Abdominal x-ray as ordered.  Recommend she continue MiraLax.  Recommended increasing water and fiber intake.  Recommend lactulose.  May also continue to use Dulcolax over-the-counter suppositories.  If no better or symptoms are worsening she needs to report directly to the ER.  Needs follow up with her GI specialist  (K58.1) Irritable bowel syndrome with constipation  Plan: XR KUB AND UPRIGHT ABDOMEN [IMG1960]        As above.     Orders Placed This Encounter    XR KUB AND UPRIGHT ABDOMEN [IMG1960]    lactulose (ENULOSE) 10 gram/15 mL Oral Solution     The patient was instructed to call office or go to the ER with any new or worsening symptoms.           Paschal Dopp, CRNP

## 2023-04-03 ENCOUNTER — Encounter (INDEPENDENT_AMBULATORY_CARE_PROVIDER_SITE_OTHER): Payer: Self-pay | Admitting: Family

## 2023-04-04 ENCOUNTER — Encounter (INDEPENDENT_AMBULATORY_CARE_PROVIDER_SITE_OTHER): Payer: Self-pay | Admitting: Family

## 2023-04-05 ENCOUNTER — Encounter (INDEPENDENT_AMBULATORY_CARE_PROVIDER_SITE_OTHER): Payer: Self-pay | Admitting: Family

## 2023-04-05 ENCOUNTER — Other Ambulatory Visit: Payer: Self-pay

## 2023-04-05 ENCOUNTER — Ambulatory Visit (INDEPENDENT_AMBULATORY_CARE_PROVIDER_SITE_OTHER): Payer: Self-pay | Admitting: Family

## 2023-04-05 VITALS — BP 120/80 | HR 98 | Temp 97.8°F | Resp 17 | Ht 62.01 in | Wt 180.0 lb

## 2023-04-05 DIAGNOSIS — R21 Rash and other nonspecific skin eruption: Secondary | ICD-10-CM

## 2023-04-05 DIAGNOSIS — E039 Hypothyroidism, unspecified: Secondary | ICD-10-CM

## 2023-04-05 DIAGNOSIS — F32A Depression, unspecified: Secondary | ICD-10-CM

## 2023-04-05 DIAGNOSIS — F419 Anxiety disorder, unspecified: Secondary | ICD-10-CM

## 2023-04-05 MED ORDER — VENLAFAXINE ER 37.5 MG CAPSULE,EXTENDED RELEASE 24 HR
37.5000 mg | ORAL_CAPSULE | Freq: Every day | ORAL | 1 refills | Status: DC
Start: 2023-04-05 — End: 2023-07-04

## 2023-04-05 MED ORDER — TRIAMCINOLONE ACETONIDE 0.025 % TOPICAL OINTMENT
TOPICAL_OINTMENT | Freq: Two times a day (BID) | CUTANEOUS | 0 refills | Status: AC
Start: 2023-04-05 — End: 2023-04-15

## 2023-04-05 NOTE — Progress Notes (Signed)
PRIMARY CARE, [facility]  [address] [place] [address]    Progress Note    Encounter Date: 04/05/2023    Patient ID:  Traci [name]  YSA:[id number]    DOB: January 08, 2001  Age: 22 y.o. female  Subjective   Subjective: acute with anxiety and rash.      Chief Complaint   Patient presents with    Anxiety    Rash    Constipation     HPI    The patient presents today with c/o anxiety.  Has a history of anxiety and has tried multiple medications in the past without much relief.  Has tried Buspar, Zoloft, Paxil, Lexapro and atarax without relief.  Reports had some relief with Prozac but it caused her weight gain.  Reports that there is more anxiety than depression.  Reports it is worse at night.  Sometimes she sleeps to much and sometimes she doesn't sleep enough.  Denies any SI or HI. Does have a history of hypothyroidism. Not currently on medication.      The patient also has c/o a rash on her neck.  Has not changed lotions, detergents, soaps or any other house hold chemicals.  Rash present for about two weeks. Is itchy sometimes.  Very dry.  Has used moisturizing lotions without much relief.    Current Outpatient Medications   Medication Sig    Ibuprofen (MOTRIN) 600 mg Oral Tablet Take 1 Tablet (600 mg total) by mouth Four times a day as needed for Pain     Allergies   Allergen Reactions    Sulfamethoxazole-Trimethoprim Hives/ Urticaria     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    PONV (postoperative nausea and  vomiting)     Smoker     vapes with nicotine    UTI (urinary tract infection)     Vitamin D deficiency          Past Surgical History:   Procedure Laterality Date    FOOT SURGERY Right     HX CHOLECYSTECTOMY      HX DENTAL EXTRACTION      HX FOOT SURGERY      HX OTHER  10/30/2015    DIAG LAPARO SEPARATE PROC - fulgration of endometriotic implants    HX OTHER      ORIF Right foot    HX TONSIL AND ADENOIDECTOMY      HX TONSILLECTOMY      LAPAROSCOPIC ENDOMETRIOSIS FULGURATION      LAPAROSCOPIC ENDOMETRIOSIS FULGURATION      2nd surgery was to also drain a blood clot.         Family Medical History:       Problem Relation (Age of Onset)  Alopecia Sister    COLITIS Brother    Hypertension (High Blood Pressure) Mother    Migraines Mother    No Known Problems Father    Thyroid Disease Mother            Social History     Tobacco Use    Smoking status: Never    Smokeless tobacco: Never   Vaping Use    Vaping status: Every Day    Substances: Nicotine    Devices: Pre-filled pod   Substance Use Topics    Alcohol use: Yes     Comment: occasionally    Drug use: Not Currently     Types: Marijuana     Comment: has medical marijuana card; smokes marijuana  2 times a day       Review of Systems   Constitutional:  Negative for fatigue and fever.   Respiratory:  Negative for cough, shortness of breath and wheezing.    Cardiovascular:  Negative for chest pain and palpitations.   Gastrointestinal:  Negative for nausea and vomiting.   Skin:  Positive for rash.   Neurological:  Negative for dizziness and headaches.        Objective   Objective:   Vitals: BP 120/80 (Site: Left Arm, Patient Position: Sitting, Cuff Size: Adult)   Pulse 98   Temp 36.6 C (97.8 F)   Resp 17   Ht 1.575 m (5' 2.01")   Wt 81.6 kg (180 lb)   SpO2 99%   BMI 32.91 kg/m         Physical Exam  Constitutional:       Appearance: Normal appearance.   Cardiovascular:      Rate and Rhythm: Normal rate and regular rhythm.   Abdominal:      General: Abdomen is  protuberant.      Tenderness: There is no abdominal tenderness.   Skin:     Findings: Rash present.   Neurological:      Mental Status: She is alert.   Psychiatric:         Attention and Perception: Attention and perception normal.          Assessment & Plan:   (F41.9,  F32.A) Anxiety and depression  (primary encounter diagnosis)  Plan: Refer to External Provider, THYROID STIMULATING        HORMONE WITH FREE T4 REFLEX        Start Effexor 37.5 mg.  Side effects and expected outcomes explained at length with patient.  Recommended one-month follow-up.  Given elevation in TSH at last check recommend repeating TSH as soon as possible.  Ordered.    (R21) Rash  Plan:  Triamcinolone cream sent to pharmacy if no better please call the office    (E03.9) Hypothyroidism, unspecified type  Plan:  As above repeat TSH    Orders Placed This Encounter    THYROID STIMULATING HORMONE WITH FREE T4 REFLEX    Refer to External Provider    venlafaxine (EFFEXOR XR) 37.5 mg Oral Capsule, Sust. Release 24 hr    triamcinolone acetonide (KENALOG) 0.025 % Ointment     The patient was instructed to call office or go to the ER with any new or worsening symptoms.              Paschal Dopp, CRNP

## 2023-04-05 NOTE — Nursing Note (Signed)
Follow up on Anxiety and rash

## 2023-04-07 ENCOUNTER — Other Ambulatory Visit: Payer: Self-pay

## 2023-04-07 ENCOUNTER — Ambulatory Visit: Payer: Self-pay | Attending: Family

## 2023-04-07 DIAGNOSIS — F419 Anxiety disorder, unspecified: Secondary | ICD-10-CM | POA: Insufficient documentation

## 2023-04-07 DIAGNOSIS — F32A Depression, unspecified: Secondary | ICD-10-CM | POA: Insufficient documentation

## 2023-04-07 LAB — THYROID STIMULATING HORMONE WITH FREE T4 REFLEX: TSH: 1.899 u[IU]/mL (ref 0.350–4.940)

## 2023-04-10 ENCOUNTER — Encounter (INDEPENDENT_AMBULATORY_CARE_PROVIDER_SITE_OTHER): Payer: Self-pay | Admitting: Family

## 2023-04-12 ENCOUNTER — Ambulatory Visit (INDEPENDENT_AMBULATORY_CARE_PROVIDER_SITE_OTHER): Payer: Self-pay | Admitting: Family Medicine

## 2023-04-12 MED ORDER — LACTULOSE 10 GRAM/15 ML ORAL SOLUTION
30.0000 mL | Freq: Every day | ORAL | 0 refills | Status: AC
Start: 2023-04-12 — End: 2023-04-15

## 2023-04-12 NOTE — Telephone Encounter (Signed)
Regarding: Returning Call  ----- Message from Orbie Hurst sent at 04/12/2023 12:02 PM EST -----  Copied From CRM #[id number].Tacker, Rhylee L is returning a call they received from the staff   Pt is returning a call from Myda Detwiler. And is asking for a return call .

## 2023-04-12 NOTE — Telephone Encounter (Signed)
Sent.  If no better or worsening please report to er

## 2023-04-12 NOTE — Telephone Encounter (Signed)
Regarding: Clinical Request  ----- Message from Orbie Hurst sent at 04/12/2023 10:57 AM EST -----  Copied From CRM #[id number].[name], Traci L called with a new medication request.   Pt is calling states she is impacted again and is requesting     actulose (ENULOSE) 10 gram/15 mL Oral Solution (Discontinued) 90 mL 0 03/31/2023 04/05/2023   Sig - Route: Take 30 mL by mouth Once a day for 3 days - Oral   Sent to pharmacy as: lactulose 10 gram/15 mL oral solution (ENULOSE)   Class: E-Rx   Non-formulary Exception Code: RXHUB/No Formulary Info Available   E-Prescribing Status: Receipt confirmed by pharmacy (03/31/2023  1:14 PM EST)   E-Cancel Status: Transmission to pharmacy in progress (04/05/2023  4:35 PM EST)        Sent to :  [id number] - [name], [place] - [address]  [address] [place] [address] [place] [address]  Phone: [phone] Fax: [phone]  Hours: Not open 24 hours

## 2023-04-12 NOTE — Telephone Encounter (Signed)
RTC to pt. Left vm to call office.

## 2023-04-12 NOTE — Telephone Encounter (Signed)
Pt states that she has not had a BM in 8 days. She denies n/v. Denies abd pain. States that she is taking Miralax 2x/week as per recommendations of GI. States she tried suppository on Saturday with no relief. States that she called GI for f/u. She can't get in until middle of January. Did not have abd xray completed. Asking for refill of lactulose.

## 2023-04-12 NOTE — Telephone Encounter (Signed)
[facility]  Phone: [phone]    Fax: [phone]   Patient is aware of this, and verbally understood the message.   Nona Dell, [place]  04/12/2023, 12:59

## 2023-05-08 ENCOUNTER — Encounter (INDEPENDENT_AMBULATORY_CARE_PROVIDER_SITE_OTHER): Payer: Self-pay | Admitting: Family

## 2023-05-09 ENCOUNTER — Encounter (INDEPENDENT_AMBULATORY_CARE_PROVIDER_SITE_OTHER): Payer: PRIVATE HEALTH INSURANCE | Admitting: Family

## 2023-05-11 ENCOUNTER — Encounter (INDEPENDENT_AMBULATORY_CARE_PROVIDER_SITE_OTHER): Payer: PRIVATE HEALTH INSURANCE | Admitting: Family

## 2023-05-17 ENCOUNTER — Encounter (INDEPENDENT_AMBULATORY_CARE_PROVIDER_SITE_OTHER): Payer: PRIVATE HEALTH INSURANCE | Admitting: Family

## 2023-06-05 ENCOUNTER — Ambulatory Visit (INDEPENDENT_AMBULATORY_CARE_PROVIDER_SITE_OTHER): Payer: PRIVATE HEALTH INSURANCE | Admitting: Family

## 2023-06-13 ENCOUNTER — Encounter (INDEPENDENT_AMBULATORY_CARE_PROVIDER_SITE_OTHER): Payer: Self-pay | Admitting: Internal Medicine

## 2023-06-15 ENCOUNTER — Encounter (INDEPENDENT_AMBULATORY_CARE_PROVIDER_SITE_OTHER): Payer: Self-pay | Admitting: Internal Medicine

## 2023-06-16 ENCOUNTER — Encounter (INDEPENDENT_AMBULATORY_CARE_PROVIDER_SITE_OTHER): Payer: Self-pay | Admitting: Internal Medicine

## 2023-06-23 ENCOUNTER — Encounter (INDEPENDENT_AMBULATORY_CARE_PROVIDER_SITE_OTHER): Payer: Self-pay | Admitting: Internal Medicine

## 2023-07-04 ENCOUNTER — Other Ambulatory Visit: Payer: Self-pay

## 2023-07-04 ENCOUNTER — Ambulatory Visit (INDEPENDENT_AMBULATORY_CARE_PROVIDER_SITE_OTHER): Payer: Self-pay | Admitting: Family

## 2023-07-04 VITALS — BP 124/82 | HR 72 | Temp 97.6°F | Resp 18 | Ht 62.01 in | Wt 170.0 lb

## 2023-07-04 DIAGNOSIS — F32A Depression, unspecified: Secondary | ICD-10-CM

## 2023-07-04 DIAGNOSIS — R059 Cough, unspecified: Secondary | ICD-10-CM

## 2023-07-04 DIAGNOSIS — H6691 Otitis media, unspecified, right ear: Secondary | ICD-10-CM

## 2023-07-04 DIAGNOSIS — F419 Anxiety disorder, unspecified: Secondary | ICD-10-CM

## 2023-07-04 MED ORDER — BENZONATATE 200 MG CAPSULE
200.0000 mg | ORAL_CAPSULE | Freq: Three times a day (TID) | ORAL | 0 refills | Status: DC | PRN
Start: 2023-07-04 — End: 2024-02-09

## 2023-07-04 MED ORDER — AMOXICILLIN 875 MG TABLET
875.0000 mg | ORAL_TABLET | Freq: Two times a day (BID) | ORAL | 0 refills | Status: AC
Start: 2023-07-04 — End: 2023-07-14

## 2023-07-04 MED ORDER — VENLAFAXINE ER 75 MG CAPSULE,EXTENDED RELEASE 24 HR
75.0000 mg | ORAL_CAPSULE | Freq: Every day | ORAL | 1 refills | Status: DC
Start: 2023-07-04 — End: 2023-09-05

## 2023-07-04 NOTE — Progress Notes (Signed)
 [facility] PRIMARY CARE  PRIMARY CARE, [facility]  [address] [place] [address]  [phone]    ICESS [name] is an 23 y.o. female who is here for a routine follow up.     Subjective:  The patient has a history of anxiety and depression.  The patient takes Effexor  and reports good compliance and control of symptoms. Wonders if dosage could be increased.  The patient denies any changes in sleep patterns.  Denies any SI or HI. Denies any CP, SOB or palpitations.    The patient presents today with c/o ear pain, cough, congestion.  Reports that home covid tests are negative.  Denies fever.  Taking OTC without relief.  Reports she does have SOB at night. Symptoms started 4 days ago.   Allergies   Allergen Reactions    Sulfamethoxazole-Trimethoprim Hives/ Urticaria       Current Outpatient Medications:     Ibuprofen (MOTRIN) 600 mg Oral Tablet, Take 1 Tablet (600 mg total) by mouth Four times a day as needed for Pain, Disp: 30 Tablet, Rfl: 0    venlafaxine (EFFEXOR XR) 37.5 mg Oral Capsule, Sust. Release 24 hr, Take 1 Capsule (37.5 mg total) by mouth Once a day, Disp: 30 Capsule, Rfl: 1  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    PONV (postoperative nausea and vomiting)     Smoker     vapes with nicotine    UTI (urinary tract infection)     Vitamin D deficiency          Past Surgical History:   Procedure Laterality Date    FOOT SURGERY Right     HX CHOLECYSTECTOMY      HX DENTAL EXTRACTION      HX FOOT  SURGERY      HX OTHER  10/30/2015    DIAG LAPARO SEPARATE PROC - fulgration of endometriotic implants    HX OTHER      ORIF Right foot    HX TONSIL AND ADENOIDECTOMY      HX TONSILLECTOMY      LAPAROSCOPIC ENDOMETRIOSIS FULGURATION      LAPAROSCOPIC ENDOMETRIOSIS FULGURATION      2nd surgery was to also drain a blood clot.         Social History     Socioeconomic History    Marital status: Single     Spouse name: Not on file    Number of children: Not on file    Years of education: Not on file    Highest education level: Not on file   Occupational History    Not on file   Tobacco Use    Smoking status: Never    Smokeless tobacco: Never  Vaping Use    Vaping status: Every Day    Substances: Nicotine    Devices: Pre-filled pod   Substance and Sexual Activity    Alcohol use: Yes     Comment: occasionally    Drug use: Not Currently     Types: Marijuana     Comment: has medical marijuana card; smokes marijuana  2 times a day    Sexual activity: Yes     Birth control/protection: Condom   Other Topics Concern    Ability to Walk 1 Flight of Steps without SOB/CP Not Asked    Routine Exercise Not Asked    Ability to Walk 2 Flight of Steps without SOB/CP Not Asked    Unable to Ambulate Not Asked    Total Care Not Asked    Ability To Do Own ADL's Not Asked    Uses Walker Not Asked    Other Activity Level Not Asked    Uses Cane Not Asked   Social History Narrative    Not on file     Social Determinants of Health     Financial Resource Strain: Low Risk  (12/17/2021)    Financial Resource Strain     SDOH Financial: No   Transportation Needs: Low Risk (04/21/2022)    [facility])    Transportation     Has a lack of transportation kept you from medical appointments, meetings, work, or from getting things needed for daily living?: Not on file   Social Connections: Medium Risk (12/17/2021)    Social Connections     SDOH Social Isolation: 3 to 5 times a week   Intimate  Partner Violence: Low Risk  (12/17/2021)    Intimate Partner Violence     SDOH Domestic Violence: No   Housing Stability: Low Risk  (12/17/2021)    Housing Stability     SDOH Housing Situation: I have housing.     SDOH Housing Worry: No         ROS:  Constitutional: Denies any fever or chills.   ENT: + for nasal congestion and ear pain   Respiratory: + for cough.   Cardiovascular: Denies any Chest pain  palpitations or edema.   Gastrointestinal: Denies any abd pain, N/V/D  Genitourinary: Denies any dysuria or hematuria  Musculoskeletal: Denies any joint pain or weakness  Neurologic: Denies any memory loss or confusion  Integumentary : Denies any rashes           Objective:    Physical Exam:  BP 124/82   Pulse 72   Temp 36.4 C (97.6 F)   Resp 18   Ht 1.575 m (5' 2.01")   Wt 77.1 kg (170 lb)   SpO2 98%   BMI 31.08 kg/m     General: Patient is alert and oriented and in no distress   Head: normocephalic, atraumatic  ENT: EOM intact, normal conjunctiva, PERRLA, right tm with bulging tm.   Abdomen: Soft and non tender,  +BS in all four quadrants, no organomegaly  Respiratory: Lung fields clear bilaterally equal chest rise and expansion   Heart: Regular rate and Rhythm, no edema   Neurologic: Alert and oriented  Skin: No rashes noted   Psychiatric: Patient is cooperative and in no acute distress.      Assessment and Plan:  (F41.9,  F32.A) Anxiety and depression  (primary encounter diagnosis)  Plan: May increase Effexor to 75mg  daily.  Follow up 3 months and sooner  with problems.      (R05.9) Cough, unspecified type  Plan: tessalon sent to pharmacy. Declines covid, influenza and rsv testing.      (H66.91) Right acute otitis media  Plan: Abx sent to pharmacy.  Please call office or go to the ER with any new or worsening symptoms.    Orders Placed This Encounter    venlafaxine (EFFEXOR XR) 75 mg Oral Capsule, Sust. Release 24 hr    Benzonatate (TESSALON) 200 mg Oral Capsule    amoxicillin (AMOXIL) 875 mg Oral Tablet      The patient was instructed to call office or go to the ER with any new or worsening symptoms.           Paschal Dopp, CRNP

## 2023-07-04 NOTE — Nursing Note (Signed)
 Follow up. Pt requesting refills

## 2023-09-05 ENCOUNTER — Other Ambulatory Visit (INDEPENDENT_AMBULATORY_CARE_PROVIDER_SITE_OTHER): Payer: Self-pay | Admitting: Family Medicine

## 2023-09-05 MED ORDER — VENLAFAXINE ER 75 MG CAPSULE,EXTENDED RELEASE 24 HR
75.0000 mg | ORAL_CAPSULE | Freq: Every day | ORAL | 1 refills | Status: DC
Start: 2023-09-05 — End: 2023-11-14

## 2023-09-05 NOTE — Telephone Encounter (Signed)
 Last Visit:07/04/2023     Upcoming appointments: Visit date not found           Kandis Ormond, RN  09/05/2023, 11:58     Summary: Refill Request    Copied From CRM [id number].  Traci [name] L () called to request a prescription refill.    venlafaxine  (EFFEXOR  XR) 75 mg Oral Capsule, Sust. Release 24 hr    Preferred Pharmacy     [id number] - [place], [place] - [address]   [address] [place] [address]   Phone: [phone] Fax: [phone]   Hours: Not open 24 hours

## 2023-10-25 ENCOUNTER — Ambulatory Visit (INDEPENDENT_AMBULATORY_CARE_PROVIDER_SITE_OTHER): Payer: Self-pay | Admitting: Family Medicine

## 2023-11-14 ENCOUNTER — Other Ambulatory Visit (INDEPENDENT_AMBULATORY_CARE_PROVIDER_SITE_OTHER): Payer: Self-pay | Admitting: Family Medicine

## 2023-11-14 NOTE — Telephone Encounter (Signed)
 Last Visit:07/04/2023     Upcoming appointments: Visit date not found           Traci [name], [place]  11/14/2023, 07:45

## 2024-01-04 ENCOUNTER — Telehealth (INDEPENDENT_AMBULATORY_CARE_PROVIDER_SITE_OTHER): Payer: Self-pay | Admitting: Family Medicine

## 2024-01-04 MED ORDER — VENLAFAXINE ER 75 MG CAPSULE,EXTENDED RELEASE 24 HR
75.0000 mg | ORAL_CAPSULE | Freq: Every day | ORAL | 0 refills | Status: DC
Start: 2024-01-04 — End: 2024-02-09

## 2024-01-04 NOTE — Telephone Encounter (Signed)
 Last Visit:07/04/2023     Upcoming appointments: Visit date not found           Marsa Durand, RN  01/04/2024, 13:43       Copied From CRM [id number].  Traci [name] (Self) called to request a prescription refill. Pt is down to 4 pills.    --venlafaxine  (EFFEXOR  XR) 75 mg Oral Capsule, Sust. Release 24 hr  Medication  Date: 11/14/2023 Department: Primary Care, [facility] Ordering/Authorizing: Lucinda Millman, MD    Order Providers    Prescribing Provider Encounter Provider  Lucinda Millman, MD Lucinda Millman, MD    Outpatient Medication Detail    Disp Refills Start End  venlafaxine  (EFFEXOR  XR) 75 mg Oral Capsule, Sust. Release 24 hr 30 Capsule 0 11/14/2023 -  Sig - Route: TAKE ONE CAPSULE (75 MG TOTAL) BY MOUTH DAILY - Oral  Sent to pharmacy as: venlafaxine  ER 75 mg capsule,extended release 24 hr (EFFEXOR  XR)  Non-formulary Exception Code: RXHUB/No Formulary Info Available  E-Prescribing Status: Receipt confirmed by pharmacy (11/14/2023  5:27 PM EDT)    Pharmacy    [facility] #[id number] - [place], [place] - [address]

## 2024-01-04 NOTE — Progress Notes (Signed)
Please schedule follow-up visit this year. Thank you.

## 2024-01-04 NOTE — Telephone Encounter (Signed)
 Lucinda Millman, MD      01/04/24  2:22 PM  Unsigned Note      Please schedule follow-up visit this year. Thank you

## 2024-01-04 NOTE — Telephone Encounter (Signed)
Left vm to schedule rx refill

## 2024-02-09 ENCOUNTER — Ambulatory Visit (INDEPENDENT_AMBULATORY_CARE_PROVIDER_SITE_OTHER): Payer: PRIVATE HEALTH INSURANCE | Admitting: Family Medicine

## 2024-02-09 ENCOUNTER — Other Ambulatory Visit: Payer: Self-pay

## 2024-02-09 ENCOUNTER — Other Ambulatory Visit: Payer: Self-pay | Attending: Family Medicine | Admitting: Family Medicine

## 2024-02-09 ENCOUNTER — Encounter (INDEPENDENT_AMBULATORY_CARE_PROVIDER_SITE_OTHER): Payer: Self-pay | Admitting: Family Medicine

## 2024-02-09 VITALS — BP 124/84 | HR 82 | Temp 97.0°F | Resp 16 | Ht 62.01 in | Wt 170.0 lb

## 2024-02-09 DIAGNOSIS — R10A3 Flank pain, bilateral: Secondary | ICD-10-CM | POA: Insufficient documentation

## 2024-02-09 DIAGNOSIS — R82998 Other abnormal findings in urine: Secondary | ICD-10-CM

## 2024-02-09 DIAGNOSIS — F5104 Psychophysiologic insomnia: Secondary | ICD-10-CM

## 2024-02-09 DIAGNOSIS — R3989 Other symptoms and signs involving the genitourinary system: Secondary | ICD-10-CM

## 2024-02-09 DIAGNOSIS — Z23 Encounter for immunization: Secondary | ICD-10-CM

## 2024-02-09 DIAGNOSIS — Z7189 Other specified counseling: Secondary | ICD-10-CM

## 2024-02-09 DIAGNOSIS — Z720 Tobacco use: Secondary | ICD-10-CM

## 2024-02-09 DIAGNOSIS — N390 Urinary tract infection, site not specified: Secondary | ICD-10-CM

## 2024-02-09 DIAGNOSIS — E66811 Obesity, class 1: Secondary | ICD-10-CM

## 2024-02-09 DIAGNOSIS — Z13220 Encounter for screening for lipoid disorders: Secondary | ICD-10-CM

## 2024-02-09 DIAGNOSIS — Z131 Encounter for screening for diabetes mellitus: Secondary | ICD-10-CM

## 2024-02-09 DIAGNOSIS — F419 Anxiety disorder, unspecified: Secondary | ICD-10-CM

## 2024-02-09 DIAGNOSIS — L709 Acne, unspecified: Secondary | ICD-10-CM

## 2024-02-09 DIAGNOSIS — Z1159 Encounter for screening for other viral diseases: Secondary | ICD-10-CM

## 2024-02-09 LAB — URINALYSIS, MACRO/MICRO
BILIRUBIN: NEGATIVE mg/dL
BLOOD: NEGATIVE mg/dL
GLUCOSE: NEGATIVE mg/dL
KETONES: NEGATIVE mg/dL
LEUKOCYTES: NEGATIVE WBCs/uL
NITRITE: NEGATIVE
PH: 6.5 (ref 5.0–8.0)
PROTEIN: NEGATIVE mg/dL
SPECIFIC GRAVITY: 1.03 (ref 1.005–1.030)
UROBILINOGEN: NEGATIVE mg/dL

## 2024-02-09 LAB — POCT URINE DIPSTICK
BILIRUBIN: 1
BLOOD: NEGATIVE
GLUCOSE: NORMAL
KETONE: NEGATIVE
NITRITE: NEGATIVE
PH: 8
SPECIFIC GRAVITY: 1.015
UROBILINOGEN: 0.2

## 2024-02-09 MED ORDER — NITROFURANTOIN MONOHYDRATE/MACROCRYSTALS 100 MG CAPSULE
100.0000 mg | ORAL_CAPSULE | Freq: Two times a day (BID) | ORAL | 0 refills | Status: AC
Start: 2024-02-09 — End: 2024-02-14

## 2024-02-09 MED ORDER — MELATONIN 10 MG TABLET: 10.0000 mg | ORAL_TABLET | Freq: Every evening | ORAL | 1 refills | Status: DC | PRN

## 2024-02-09 MED ORDER — VENLAFAXINE ER 75 MG CAPSULE,EXTENDED RELEASE 24 HR
75.0000 mg | ORAL_CAPSULE | Freq: Every day | ORAL | 1 refills | Status: AC
Start: 2024-02-09 — End: ?

## 2024-02-09 MED ORDER — CLINDAMYCIN 1 %-BENZOYL PEROXIDE 5 % TOPICAL GEL: Freq: Two times a day (BID) | CUTANEOUS | 3 refills | Status: DC

## 2024-02-09 NOTE — Nursing Note (Signed)
 02/09/24 1504   Housing Stability   What is your housing situation? Has Housing   Are you worried about losing your housing? No   Health Education and Literacy   How often do you have a problem understanding what is told to you about your medical condition?  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  No   Transportation   Has lack of transportation kept you from medical appointments, meetings, work, or from getting things needed for daily living?  No   Social Connections   How often do you see or talk to people that you care about and feel close to? (For example: talking to friends on the phone, visiting friends or family, going to church or club meetings) 3-5x a wk   Intimate Partner Violence   In the past year, have you been afraid of your partner or ex-partner? No   Do you feel physically safe and emotionally safe where you currently live? Yes   Help Needed   Would you like help with any of these needs? No   Are any of these needs urgent? No

## 2024-02-09 NOTE — Nursing Note (Signed)
 02/09/24 1500   Urine Test   Manufacturer Siemens   Urine Test - Siemens   Color (Ref Range: Yellow) (!) Light Yellow   Clarity (Ref Range: Clear) Clear   Glucose (Ref Range: Negative mg/dL) Negative   Bilirubin (Ref Range: Negative mg/dL) (!) 1+   Ketones (Ref Range: Negative mg/dL) Negative   Urine Specific Gravity (Ref Range: 1.005 - 1.030) 1.015   Blood (urine) (Ref Range: Negative mg/dL) Negative   pH (Ref Range: 5.0 - 8.0) 8.0   Protein (Ref Range: Negative mg/dL) (!) Trace   Urobilinogen (Ref Range: Normal) 0.2mg /dL (Normal)   Nitrite (Ref Range: Negative) Negative   Leukocytes (Ref Range: Negative WBC's/uL) (!) 2+   Lot # [id number]   Expiration Date 09/22/24   Culture Sent yes   Initials nh

## 2024-02-09 NOTE — Nursing Note (Signed)
 C/o urinary frequency and urgency, back pain, bladder pressure, no burning. Symptoms since last night. Last pap was done within 3 years at [facility].

## 2024-02-09 NOTE — Progress Notes (Signed)
 PRIMARY CARE, [facility]  [address] [place] [address]           Name: Traci [name]  MRN: [id number]  Date of Birth: 2000/07/14    Encounter Date: 02/09/2024    Chief Complaint   Patient presents with    Urinary Tract Infection     urinary frequency and urgency, back pain, bladder pressure, no burning. Symptoms since last night. Last pap was done within 3 years at [facility].    General    Medication Refill     Last office visit: 04/05/2023    OUTPATIENT PROGRESS NOTE    Subjective:   Patient ID:  Ms. Tantillo is a pleasant 23 y.o. female.    History of Present Illness:  Pt presents for adult exam.  She does not want to get flu shot but will get tetanus vaccine.  She states she has problems with fluctuating thyroid  function.    Diet:  healthy diet  in general with more fruits and vegetables.  She states she does not eat red meat.  Activity level: No Weakness   Sleep: Not good about 3-4 hours  Compliant with taking medications: Yes    Acute issues:  complaints above.  She states she is drinking enough water and has no possibility of pregnancy.    The history is provided by the patient.    Allergies:   Allergies[1]      Medications:   lactobacillus rhamnosus, GG, (CULTURELLE) 10 billion cell Oral Capsule, Take 1 Capsule by mouth Twice daily with food  venlafaxine  (EFFEXOR  XR) 75 mg Oral Capsule, Sust. Release 24 hr, Take 1 Capsule (75 mg total) by mouth Daily    Immunization History:     Immunization History   Administered Date(s) Administered    Diptheria/Tetanus/Pertussis 6wk to <48yrs 11/13/1999, 12/01/1999, 03/07/2000, 12/20/2000, 12/12/2002    HAEMOPHILUS B CONJUGATE VACCINE 11/13/1999, 12/01/1999, 03/07/2000, 12/20/2000    HEPATITIS B RECOMB VACCINE-PED/ADOL 1998-12-09, 01/06/1999, 11/13/1999    HPV VACCINE (QUADRIVALENT)(GARDASIL) 06/29/2010, 08/30/2010, 02/02/2011    Influenza Vaccine, 6 month-adult 02/25/2016    MEASLES/MUMPS/RUBELLA VIRUS VACCINE 12/01/1999, 12/12/2002    Meningococcal  Vaccine 06/29/2010, 10/20/2015, 10/20/2015    POLIOMYELITIS VACCINE 11/19/1999, 12/01/1999, 03/07/2000, 12/12/2002    PPD(ADMIN) 09/25/2015, 10/07/2015, 01/08/2019    Pneumovax 01/23/2002, 12/12/2002    Tetanus Toxoid/Diphtheria Toxoid/Acellular Pertussis Vaccine, Adsorbed 06/29/2010    VARICELLA VACCINE LIVE 03/07/2000, 06/10/2005     Past Medical History:     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    PONV (postoperative nausea and vomiting)     Smoker     vapes with nicotine    UTI (urinary tract infection)     Vitamin D  deficiency      Past Surgical  History:     Past Surgical History:   Procedure Laterality Date    FOOT SURGERY Right     HX CHOLECYSTECTOMY      HX DENTAL EXTRACTION      HX FOOT SURGERY      HX OTHER  10/30/2015    DIAG LAPARO SEPARATE PROC - fulgration of endometriotic implants    HX OTHER      ORIF Right foot    HX TONSIL AND ADENOIDECTOMY      HX TONSILLECTOMY      LAPAROSCOPIC ENDOMETRIOSIS FULGURATION      2nd surgery was to also drain a blood clot.     Family History:     Family Medical History:       Problem Relation (Age of Onset)    Alopecia Sister    COLITIS Brother    Hypertension (High Blood Pressure) Mother    Migraines Mother    No Known Problems Father    Thyroid  Disease Mother          Social History:   CASSADY STANCZAK  reports that she has never smoked. She has never used smokeless tobacco. She reports current alcohol use of about 0 glasses of wine, 0 cans of beer, 0 shots of liquor, and 0 standard drinks or  equivalent per week. She reports that she does not currently use drugs after having used the following drugs: Marijuana. She reports being sexually active. She reports using the following method of birth control/protection: Condom.    Review of Systems: Other than ROS in HPI, all other systems are negative.    Objective:   Vitals:    Vitals:    02/09/24 1504   BP: 124/84   Pulse: 82   Resp: 16   Temp: 36.1 C (97 F)   TempSrc: Temporal   SpO2: 100%   Weight: 77.1 kg (170 lb)   Height: 1.575 m (5' 2.01)   BMI: 31.08      Body mass index is 31.08 kg/m.    Constitutional: Alert, well developed, well nourished  HEENT:  Head: NC/AT    Eyes: Sclera anicteric, conjunctiva not injected    Ears: No pain or discharge    Nose: No discharge    Throat: Moist oral mucosa  Neck:   Supple with normal ROM  Cardiovascular: RRR, normal S1/S2, no murmurs  Pulmonary:  CTAB, equal air entry, nonlabored, no wheezes/crackles/rhonchi  Abdomen:   Soft, mild pelvic pain  Musculoskeletal:  No injury, no edema  Neurological:   Alert, oriented x 3, no abnormal tone  Skin:     Warm, pink, dry, acne, no jaundice, no pallor, no cyanosis  Psychiatric:  Normal judgment, and thought content    ASSESSMENT AND PLAN:     ICD-10-CM    1. Counseling on health promotion and disease prevention  Z71.89 COMPREHENSIVE METABOLIC PNL, FASTING     CBC     LIPID PANEL     HEPATITIS C ANTIBODY SCREEN WITH REFLEX TO HCV PCR     HIV1/HIV2 Screen, Combined Antigen and Antibody  Health screening guidelines provided.  Continue healthy diet, regular exercise as tolerated, quit nicotine on gradual weight loss recommended.  Will obtain last Pap smear report.      2. Nicotine vapor product user  Z72.0 Cessation counseling was done for 4 minutes. Education included the effects of nicotine.  Treatments for cessation: Will power.  Patient response: patient verbalizes understanding of counseling and has agreed to treatment and  cessation plan.      3. Dark urine  R82.998 POCT  Urine Dipstick     URINALYSIS WITH REFLEX MICROSCOPIC AND CULTURE IF POSITIVE  Maintain good hydration.      4. Bilateral flank pain  R10.A3 POCT Urine Dipstick     URINALYSIS WITH REFLEX MICROSCOPIC AND CULTURE IF POSITIVE  Maintain good hydration.  She states she has history of kidney stone.      5. Sensation of pressure in bladder area  R39.89 POCT Urine Dipstick     URINALYSIS WITH REFLEX MICROSCOPIC AND CULTURE IF POSITIVE  Maintain good hydration.      6. Bacterial UTI  N39.0 nitrofurantoin  monohyd/m-cryst (MACROBID ) 100 mg Oral Capsule - new    A49.9 Added antibiotic twice a day for 5 days and will call if not better.  Maintain good hydration.      7. Chronic anxiety  F41.9 COMPREHENSIVE METABOLIC PNL, FASTING     THYROID  STIMULATING HORMONE WITH FREE T4 REFLEX     venlafaxine  (EFFEXOR  XR) 75 mg Oral Capsule, Sust. Release 24 hr  Improving on Effexor .      8. Obesity (BMI 30.0-34.9)  E66.811 COMPREHENSIVE METABOLIC PNL, FASTING     CBC     LIPID PANEL     THYROID  STIMULATING HORMONE WITH FREE T4 REFLEX     HGA1C (HEMOGLOBIN A1C WITH EST AVG GLUCOSE)  Continue lifestyle changes to lose weight.      9. Lipid screening  Z13.220 LIPID PANEL      10. Need for hepatitis C screening test  Z11.59 HEPATITIS C ANTIBODY SCREEN WITH REFLEX TO HCV PCR      11. Screening for viral disease  Z11.59 HIV1/HIV2 Screen, Combined Antigen and Antibody      12. Need for Tdap vaccination  Z23 Tdap Vaccine (>10 years)- Boostrix      13. Screening for diabetes mellitus  Z13.1 HGA1C (HEMOGLOBIN A1C WITH EST AVG GLUCOSE)      14. Psychophysiological insomnia  F51.04 melatonin 10 mg Oral Tablet - new  She may take melatonin at night as needed for sleep.      15. Mild acne  L70.9 Clindamycin-Benzoyl Peroxide 1-5 % Gel - new  She may apply the gel twice a day as needed.        Tobacco cessation counseling performed.     Data reviewed:   Lab Results   Component Value Date/Time    BUN 8 11/01/2022 05:27 AM    CREATININE 0.86 11/01/2022 05:27  AM    GFR >90 11/01/2022 05:27 AM    CALCIUM 10.2 11/01/2022 05:27 AM    SODIUM 140 11/01/2022 05:27 AM    POTASSIUM 4.1 11/01/2022 05:27 AM    WBC 7.9 11/01/2022 05:27 AM    HGB 14.9 11/01/2022 05:27 AM    HCT 42.2 11/01/2022 05:27 AM    PLTCNT 323 11/01/2022 05:27 AM    AST 14 11/01/2022 05:27 AM    ALT 9 11/01/2022 05:27 AM    ALKPHOS 57 11/01/2022 05:27 AM    TSH 1.899 04/07/2023 03:18 PM    MAGNESIUM  2.2 11/01/2022 05:27 AM     Lab Results   Component Value Date/Time    COLOR Light Yellow 02/09/2024 12:00 AM    PROTEIN trace 02/09/2024 12:00 AM    GLUCOSE normal 02/09/2024 12:00 AM    UROBILINOGEN 0.2 02/09/2024 12:00 AM    LEUKOCYTES ++ 02/09/2024 12:00 AM    NITRITE neg 02/09/2024 12:00 AM    BILIRUBIN  1 02/09/2024 12:00 AM    BLOOD neg 02/09/2024 12:00 AM     Orders Placed This Encounter    Tdap Vaccine (>10 years)- Boostrix    URINALYSIS WITH REFLEX MICROSCOPIC AND CULTURE IF POSITIVE    COMPREHENSIVE METABOLIC PNL, FASTING    CBC    LIPID PANEL    HEPATITIS C ANTIBODY SCREEN WITH REFLEX TO HCV PCR    THYROID  STIMULATING HORMONE WITH FREE T4 REFLEX    HIV1/HIV2 Screen, Combined Antigen and Antibody    HGA1C (HEMOGLOBIN A1C WITH EST AVG GLUCOSE)    URINALYSIS, MACRO/MICRO    POCT Urine Dipstick    nitrofurantoin  monohyd/m-cryst (MACROBID ) 100 mg Oral Capsule    melatonin 10 mg Oral Tablet    venlafaxine  (EFFEXOR  XR) 75 mg Oral Capsule, Sust. Release 24 hr    Clindamycin-Benzoyl Peroxide 1-5 % Gel     Recheck blood work this year. Above plan was discussed with the patient and patient verbalized understanding. Patient agreed with above treatment and plan. Questions answered to patient's satisfaction. Risks, benefits, and alternatives to above treatment discussed with patient.   Follow-up in 6 months, sooner should new symptoms or problems arise.   She  was advised to contact the office if with any questions or concerns.    Vernell PARAS. Lucinda, MD  DEPT PHONE: [phone]  DEPT FAX: [phone]  Note:  This chart was transcribed using voice recognition software and may contain unintended word substitution or minor typographical errors.         [1]   Allergies  Allergen Reactions    Sulfamethoxazole-Trimethoprim Hives/ Urticaria

## 2024-02-14 ENCOUNTER — Ambulatory Visit (INDEPENDENT_AMBULATORY_CARE_PROVIDER_SITE_OTHER): Payer: Self-pay | Admitting: Family Medicine

## 2024-02-14 NOTE — Telephone Encounter (Signed)
 Regarding: Clinical Question  ----- Message from Alan BRAVO sent at 02/14/2024  3:33 PM EDT -----  Copied From CRM #[id number].Jama Best L (Self) called with a clinical question.   Stated melatonin isn't helping them sleep and are requesting to know if there is anything else they can have/use....  Please advise.

## 2024-02-14 NOTE — Telephone Encounter (Signed)
 Looks like patient was just started on melatonin.  She should give melatonin at least 2 weeks.  If not effective please call the office

## 2024-02-15 NOTE — Telephone Encounter (Signed)
 Left vm waiting on call back

## 2024-02-15 NOTE — Telephone Encounter (Signed)
 Pt made aware.

## 2024-03-15 ENCOUNTER — Encounter (INDEPENDENT_AMBULATORY_CARE_PROVIDER_SITE_OTHER): Payer: PRIVATE HEALTH INSURANCE | Admitting: Physician Assistant

## 2024-03-15 ENCOUNTER — Other Ambulatory Visit: Payer: Self-pay

## 2024-03-15 ENCOUNTER — Ambulatory Visit (INDEPENDENT_AMBULATORY_CARE_PROVIDER_SITE_OTHER): Payer: PRIVATE HEALTH INSURANCE | Admitting: Physician Assistant

## 2024-03-15 ENCOUNTER — Encounter (INDEPENDENT_AMBULATORY_CARE_PROVIDER_SITE_OTHER): Payer: Self-pay | Admitting: Physician Assistant

## 2024-03-15 VITALS — BP 128/82 | HR 86 | Temp 97.2°F | Resp 18 | Ht 62.01 in | Wt 169.0 lb

## 2024-03-15 DIAGNOSIS — M549 Dorsalgia, unspecified: Secondary | ICD-10-CM

## 2024-03-15 DIAGNOSIS — F17290 Nicotine dependence, other tobacco product, uncomplicated: Secondary | ICD-10-CM

## 2024-03-15 MED ORDER — METHOCARBAMOL 750 MG TABLET: 750.0000 mg | ORAL_TABLET | Freq: Four times a day (QID) | ORAL | 0 refills | Status: DC | PRN

## 2024-03-15 MED ORDER — DICLOFENAC 1 % TOPICAL GEL: CUTANEOUS | 0 refills | Status: DC

## 2024-03-15 NOTE — Nursing Note (Signed)
 Back pain and lightheadedness

## 2024-03-15 NOTE — Progress Notes (Signed)
 PRIMARY CARE, [facility]  [address] [place] [address]       Name: Traci [name] MRN:  [id number]   Date: 03/15/2024 Age: 23 y.o.      Chief Complaint   Patient presents with    Back Pain      Subjective:  Traci [name] is an 23 y.o. female who presents for an acute visit with c/o pain present admit back since Monday.  She states she does work in [employer] and tends to do a lot of "heavy labor".  She denies any known falls or injuries to back prior to symptomatic onset.  She describes pain is located in the mid back region, tender to touch.  She has been taking some ibuprofen  last dose last night, no OTC medications were taken today.  She notes she does have chronic GI issues and therefore has been taking about 400 mg of ibuprofen  as needed and tolerating this well.  She has also been utilizing heating pad which does seem to help.  She does feel little bit stiff in her back as well.  She denies any changes in bowel or bladder habitus, no rashes.      Allergies[1]  Current Medications[2]  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    PONV (postoperative nausea and vomiting)     Smoker     vapes with nicotine    UTI (urinary tract infection)     Vitamin D  deficiency          Past Surgical History:   Procedure Laterality Date    FOOT SURGERY Right     HX CHOLECYSTECTOMY      HX DENTAL EXTRACTION      HX FOOT SURGERY      HX OTHER  10/30/2015    DIAG LAPARO SEPARATE PROC - fulgration of endometriotic implants    HX  OTHER      ORIF Right foot    HX TONSIL AND ADENOIDECTOMY      HX TONSILLECTOMY      LAPAROSCOPIC ENDOMETRIOSIS FULGURATION      LAPAROSCOPIC ENDOMETRIOSIS FULGURATION      2nd surgery was to also drain a blood clot.         Social History[3]     ROS:  See HPI Above.   Constitutional: Denies fevers, chills or fatigue.   Respiratory: Denies Cough, SOB, or wheezing.  Cardiovascular: Denies chest pain, palpitations or edema.   Gastrointestinal: Denies stool incontinence  Genitourinary: Denies urinary incontinence   Musculoskeletal:  Admits mid back pain, muscle stiffness.  Denies falls or injury.  Neurologic: Denies new onset numbness or tingling    Integumentary : Denies rashes or lesions.      Objective:  Physical Exam:  BP 128/82 (Site: Left Arm, Patient Position: Sitting, Cuff Size: Adult)   Pulse 86   Temp 36.2 C (97.2 F) (Temporal)   Resp 18   Ht 1.575 m (5' 2.01)   Wt 76.7 kg (169 lb)   SpO2 99%   BMI 30.90 kg/m     General: Patient is alert and oriented and in no distress   Head: normocephalic, atraumatic  Eye: EOM intact  Respiratory: Lung fields CTAB, equal chest rise and expansion.  No tachypnea or respiratory distress.  Heart: Regular rate and rhythm, no edema noted  Neurologic: Alert and oriented. No focal neurological deficits noted.   Musculoskeletal: Gait is grossly normal. Pt ambulates without the aid of assistive devices.  Patient does have some slight restriction with anterior spinal flexion, posterior spinal flexion and lateral spinal bending due to reported pain.  There is diffuse tenderness on palpation of mid back muscles.  Skin: No rashes noted. Skin is warm and dry to touch.   Psychiatric: Patient is pleasant, cooperative and in no acute distress.      Assessment and Plan:  Orders Placed This Encounter    methocarbamoL  (ROBAXIN ) 750 mg Oral Tablet    diclofenac  sodium (VOLTAREN) 1 % Gel         ICD-10-CM    1. Mid back pain  M54.9         Symptoms do appear musculoskeletal in nature.   I am hopeful with symptomatic/supportive treatment symptoms will self resolve in the next 1-2 weeks.  She was encouraged to continue to utilize ice/heat application as tolerated.  She may also continue oral NSAIDs.  As she does report history of GI issues and she is currently tolerating oral NSAID dosing well, will defer increasing oral NSAID dose at this time.  She may utilize topical Voltaren gel for improved symptomatic relief.  May also utilize Robaxin  as needed.  Contact office or report to ED if symptoms persist or worsen.  Patient verbalizes understanding and is agreeable to the above plan.    Patient seen by: Dalton Sables, PA-C    The patient was seen independently however a supervising/collaborating physician was readily available for consult if needed.  The supervising/collaborating physician on-site for this visit was Dr. Marylee.     Please note this report may have been transcribed using voice recognition software and could contain mild typographical errors.         [1]   Allergies  Allergen Reactions    Sulfamethoxazole-Trimethoprim Hives/ Urticaria   [2]   Current Outpatient Medications:     Clindamycin -Benzoyl Peroxide  1-5 % Gel, Apply topically Twice daily Apply to affected area after the skin has been cleansed and dried. Indications: acne, Disp: 50 g, Rfl: 3    diclofenac  sodium (VOLTAREN) 1 % Gel, Apply 2g topically up to 4 times daily as needed to upper extremity joint pains, apply 4 mg topically up to 4 times daily as needed for lower extremity joint pains.  Max 32 mg/day on all joints., Disp: 1 Each, Rfl: 0    lactobacillus rhamnosus, GG, (CULTURELLE) 10 billion cell Oral Capsule, Take 1 Capsule by mouth Twice daily with food, Disp: , Rfl:     melatonin 10 mg Oral Tablet, Take 1 Tablet (10 mg total) by mouth Every night as needed Indications: difficulty sleeping, Disp: 90 Tablet, Rfl: 1    methocarbamoL  (ROBAXIN ) 750 mg Oral Tablet, Take 1 Tablet (750 mg total) by mouth Four times a day  as  needed, Disp: 30 Tablet, Rfl: 0    venlafaxine  (EFFEXOR  XR) 75 mg Oral Capsule, Sust. Release 24 hr, Take 1 Capsule (75 mg total) by mouth Daily Indications: repeated episodes of anxiety, Disp: 90 Capsule, Rfl: 1  [3]   Social History  Tobacco Use    Smoking status: Never    Smokeless tobacco: Never   Vaping Use    Vaping status: Every Day    Substances: Nicotine    Devices: Pre-filled pod   Substance Use Topics    Alcohol use: Yes     Comment: occasionally    Drug use: Not Currently     Types: Marijuana     Comment: has medical marijuana card; smokes marijuana  2 times a day

## 2024-04-16 ENCOUNTER — Emergency Department
Admission: EM | Admit: 2024-04-16 | Discharge: 2024-04-16 | Disposition: A | Discharge status: Home / Self Care | Payer: PRIVATE HEALTH INSURANCE | Attending: Nurse Practitioner | Admitting: Nurse Practitioner

## 2024-04-16 ENCOUNTER — Emergency Department (HOSPITAL_COMMUNITY): Payer: PRIVATE HEALTH INSURANCE | Admitting: Ultrasound

## 2024-04-16 ENCOUNTER — Other Ambulatory Visit: Payer: Self-pay

## 2024-04-16 ENCOUNTER — Encounter (HOSPITAL_COMMUNITY): Payer: Self-pay

## 2024-04-16 DIAGNOSIS — R1084 Generalized abdominal pain: Secondary | ICD-10-CM

## 2024-04-16 DIAGNOSIS — N809 Endometriosis, unspecified: Secondary | ICD-10-CM | POA: Insufficient documentation

## 2024-04-16 DIAGNOSIS — N946 Dysmenorrhea, unspecified: Secondary | ICD-10-CM | POA: Insufficient documentation

## 2024-04-16 DIAGNOSIS — Z8742 Personal history of other diseases of the female genital tract: Secondary | ICD-10-CM

## 2024-04-16 DIAGNOSIS — N83202 Unspecified ovarian cyst, left side: Secondary | ICD-10-CM | POA: Insufficient documentation

## 2024-04-16 DIAGNOSIS — N944 Primary dysmenorrhea: Secondary | ICD-10-CM

## 2024-04-16 LAB — COMPREHENSIVE METABOLIC PANEL, NON-FASTING
ALBUMIN: 4.5 g/dL (ref 3.5–5.0)
ALKALINE PHOSPHATASE: 48 U/L (ref 40–110)
ALT (SGPT): 21 U/L (ref <31)
ANION GAP: 9 mmol/L (ref 4–13)
AST (SGOT): 25 U/L (ref 11–34)
BILIRUBIN TOTAL: 0.3 mg/dL (ref 0.3–1.3)
BUN/CREA RATIO: 13 (ref 6–22)
BUN: 10 mg/dL (ref 8–25)
CALCIUM: 9.1 mg/dL (ref 8.6–10.2)
CHLORIDE: 108 mmol/L (ref 96–111)
CO2 TOTAL: 24 mmol/L (ref 22–30)
CREATININE: 0.78 mg/dL (ref 0.60–1.05)
GLUCOSE: 87 mg/dL (ref 65–125)
POTASSIUM: 3.5 mmol/L (ref 3.5–5.1)
PROTEIN TOTAL: 7.6 g/dL (ref 6.4–8.3)
SODIUM: 141 mmol/L (ref 136–145)
eGFRcr - FEMALE: >90 mL/min/1.73mˆ2 (ref >=60)

## 2024-04-16 LAB — URINALYSIS, MICROSCOPIC
RBCS: 7 /HPF — ABNORMAL HIGH (ref <6.0)
WBCS: 1 /HPF (ref <11.0)

## 2024-04-16 LAB — URINALYSIS, MACROSCOPIC
BILIRUBIN: NEGATIVE mg/dL
GLUCOSE: NEGATIVE mg/dL
KETONES: NEGATIVE mg/dL
LEUKOCYTES: NEGATIVE WBCs/uL
NITRITE: NEGATIVE
PH: 7 (ref 5.0–8.0)
PROTEIN: NEGATIVE mg/dL
SPECIFIC GRAVITY: 1.03 (ref 1.005–1.030)
UROBILINOGEN: NEGATIVE mg/dL

## 2024-04-16 LAB — CBC WITH DIFF
BASOPHIL #: <0.1 x10ˆ3/uL (ref <=0.20)
BASOPHIL %: 0.4 %
EOSINOPHIL #: 0.1 x10ˆ3/uL (ref <=0.50)
EOSINOPHIL %: 1.3 %
HCT: 40.3 % (ref 34.8–46.0)
HGB: 13.5 g/dL (ref 11.5–16.0)
IMMATURE GRANULOCYTE #: <0.1 x10ˆ3/uL (ref <0.10)
IMMATURE GRANULOCYTE %: 0.4 % (ref 0.0–1.0)
LYMPHOCYTE #: 3.41 x10ˆ3/uL (ref 1.00–4.80)
LYMPHOCYTE %: 43.3 %
MCH: 28.4 pg (ref 26.0–32.0)
MCHC: 33.5 g/dL (ref 31.0–35.5)
MCV: 84.7 fL (ref 78.0–100.0)
MONOCYTE #: 0.41 x10ˆ3/uL (ref 0.20–1.10)
MONOCYTE %: 5.2 %
MPV: 11.2 fL (ref 8.7–12.5)
NEUTROPHIL #: 3.9 x10ˆ3/uL (ref 1.50–7.70)
NEUTROPHIL %: 49.4 %
PLATELETS: 288 x10ˆ3/uL (ref 150–400)
RBC: 4.76 x10ˆ6/uL (ref 3.85–5.22)
RDW-CV: 12.9 % (ref 11.5–15.5)
WBC: 7.9 x10ˆ3/uL (ref 3.7–11.0)

## 2024-04-16 LAB — HCG, SERUM QUALITATIVE, PREGNANCY: PREGNANCY, SERUM QUALITATIVE: NEGATIVE

## 2024-04-16 MED ORDER — ACETAMINOPHEN 1,000 MG/100 ML (10 MG/ML) INTRAVENOUS SOLUTION
1000.0000 mg | Freq: Four times a day (QID) | INTRAVENOUS | Status: DC | PRN
  Administered 2024-04-16: 1000 mg via INTRAVENOUS
  Administered 2024-04-16: 0 mg via INTRAVENOUS
  Filled 2024-04-16: qty 100

## 2024-04-16 MED ORDER — SODIUM CHLORIDE 0.9 % IV BOLUS
1000.0000 mL | INJECTION | Status: AC
  Administered 2024-04-16: 1000 mL via INTRAVENOUS
  Administered 2024-04-16: 0 mL via INTRAVENOUS

## 2024-04-16 NOTE — ED Provider Notes (Signed)
 [facility]           Name: Traci [name]  Age and Gender: 23 y.o. female  PCP: Vernell Sauce, MD            Chief Complaint:  Patient presents with     Chief Complaint   Patient presents with    Abdominal Pain     Period started Sunday, has endometriosis, having bad period cramps         The patient arrived by private car and is alone.      HPI      Abdominal Pain       Traci [name], date of birth 12-10-2000, is a 23 y.o. female who presents to the Emergency Department with a chief complaint of severe abdominal cramping.  States menses started two days ago. History of endometriosis, follows with Gyne at [facility].  States she has taken Ibuprofen  and Midol  without relief. States bleeding is not heavier than normal    Other than noted above, review of systems obtained and negative.      History reviewed This Encounter:     Past Medical History:  Diagnosis     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    PONV (postoperative nausea and vomiting)     Smoker     vapes with nicotine    UTI (urinary tract infection)     Vitamin D  deficiency        Past Surgical History:  Past Surgical History:   Procedure Laterality Date    Foot surgery Right     Hx cholecystectomy      Hx dental extraction      Hx foot surgery      Hx other  10/30/2015    Hx other      Hx tonsil and adenoidectomy      Hx tonsillectomy      Laparoscopic endometriosis fulguration      Laparoscopic endometriosis fulguration         Family History:    Family History   Problem Relation Age of Onset    Thyroid  Disease Mother     Hypertension (High Blood Pressure) Mother     Migraines Mother     No Known Problems Father     Alopecia Sister     COLITIS Brother        Social History     Social History[1]    Social History     Substance and Sexual Activity   Drug Use Not Currently    Types: Marijuana    Comment: has medical marijuana card; smokes marijuana  2 times a day       Vernell Sauce, MD    Allergies[2]    Outpatient  Medications Marked as Taking for the 04/16/24 encounter [facility] Encounter)   Medication Sig    cholecalciferol , vitamin D3, 25 mcg (1,000 unit) Oral Tablet Take 5 Tablets (5,000 Units total) by mouth Daily       PE:   ED Triage Vitals [04/16/24 0753]   BP (Non-Invasive) (!) 153/90   Heart Rate 77   Respiratory Rate 20   Temperature 37 C (98.6 F)   SpO2 97 %   Weight 72.6 kg (160 lb)   Height 1.6 m (5' 3)     Physical Exam  HENT:      Mouth/Throat:      Mouth: Mucous membranes are moist.   Cardiovascular:      Rate and Rhythm: Normal rate.   Pulmonary:      Effort: Pulmonary effort is normal.   Abdominal:      Palpations: Abdomen is soft.      Tenderness: There is no abdominal tenderness. There is no guarding or rebound.   Musculoskeletal:         General: Normal range of motion.   Skin:     General: Skin is warm and dry.   Neurological:      Mental Status: She is alert and oriented to person, place, and time.   Psychiatric:         Mood and Affect: Mood normal.         DDx:  Differential diagnosis includes, but is not limited to dysmenorrhea,    Initial workup:      Orders:  Orders Placed This Encounter    US  PELVIS    CBC/DIFF    COMPREHENSIVE METABOLIC PANEL, NON-FASTING    HCG, SERUM QUALITATIVE, PREGNANCY    URINALYSIS, MACROSCOPIC AND MICROSCOPIC W/CULTURE REFLEX    CBC WITH DIFF    URINALYSIS, MACROSCOPIC    URINALYSIS, MICROSCOPIC    EXTRA TUBES    BLUE TOP TUBE    NS bolus infusion 1,000 mL    acetaminophen  (OFIRMEV ) 1,000 mg  (10 mg/mL) IV 100 mL (tot vol)           Diagnostics:  I have personally reviewed all laboratory and imaging results for this patient.  Results are as listed below.  Labs:  Results for orders placed or performed during the hospital encounter of 04/16/24 (from the past 12 hours)   COMPREHENSIVE METABOLIC PANEL, NON-FASTING   Result Value Ref Range    SODIUM 141 136 - 145 mmol/L    POTASSIUM 3.5 3.5 - 5.1 mmol/L    CHLORIDE 108 96 - 111 mmol/L    CO2 TOTAL 24 22 - 30 mmol/L    ANION GAP 9 4 - 13 mmol/L    BUN 10 8 - 25 mg/dL    CREATININE 9.21 9.39 - 1.05 mg/dL    BUN/CREA RATIO 13 6 - 22    eGFRcr - FEMALE >90 >=60 mL/min/1.5m2    ALBUMIN 4.5 3.5 - 5.0 g/dL    CALCIUM 9.1 8.6 - 89.7 mg/dL    GLUCOSE 87 65 - 874 mg/dL    ALKALINE PHOSPHATASE 48 40 - 110 U/L    ALT (SGPT) 21 <31 U/L    AST (SGOT)  25 11 - 34 U/L    BILIRUBIN TOTAL 0.3 0.3 - 1.3 mg/dL    PROTEIN TOTAL 7.6 6.4 - 8.3 g/dL   HCG, SERUM QUALITATIVE, PREGNANCY   Result Value Ref Range    PREGNANCY, SERUM QUALITATIVE Negative Negative   CBC WITH DIFF  Result Value Ref Range    WBC 7.9 3.7 - 11.0 x103/uL    RBC 4.76 3.85 - 5.22 x106/uL    HGB 13.5 11.5 - 16.0 g/dL    HCT 59.6 65.1 - 53.9 %    MCV 84.7 78.0 - 100.0 fL    MCH 28.4 26.0 - 32.0 pg    MCHC 33.5 31.0 - 35.5 g/dL    RDW-CV 87.0 88.4 - 84.4 %    PLATELETS 288 150 - 400 x103/uL    MPV 11.2 8.7 - 12.5 fL    NEUTROPHIL % 49.4 %    LYMPHOCYTE % 43.3 %    MONOCYTE % 5.2 %    EOSINOPHIL % 1.3 %    BASOPHIL % 0.4 %    NEUTROPHIL # 3.90 1.50 - 7.70 x103/uL    LYMPHOCYTE # 3.41 1.00 - 4.80 x103/uL    MONOCYTE # 0.41 0.20 - 1.10 x103/uL    EOSINOPHIL # 0.10 <=0.50 x103/uL    BASOPHIL # <0.10 <=0.20 x103/uL    IMMATURE GRANULOCYTE % 0.4 0.0 - 1.0 %    IMMATURE GRANULOCYTE # <0.10 <0.10 x103/uL   URINALYSIS, MACROSCOPIC   Result Value Ref Range    COLOR Yellow Yellow, Colorless    APPEARANCE Clear Clear    SPECIFIC GRAVITY 1.030 1.005 - 1.030    PH 7.0 5.0 - 8.0    PROTEIN Negative Negative mg/dL     GLUCOSE Negative Negative mg/dL    KETONES Negative Negative mg/dL    UROBILINOGEN Negative Negative mg/dL    BILIRUBIN Negative Negative mg/dL    BLOOD Moderate (A) Negative mg/dL    NITRITE Negative Negative    LEUKOCYTES Negative Negative WBCs/uL   URINALYSIS, MICROSCOPIC   Result Value Ref Range    RBCS 7.0 (H) <6.0 /hpf    WBCS 1.0 <11.0 /hpf    BACTERIA Occasional or less Occasional or less /hpf    AMORPHOUS SEDIMENT Light Light /hpf    MUCOUS Light Light /hpf    SQUAMOUS EPITHELIAL Occasional or less Occasional or less /hpf     Labs reviewed and interpreted by me.    Radiology:    US  PELVIS   Final Result by Edi, Radresults In (12/23 9061)      1. Endometrium measures 2 mm.   2. Small amount of anechoic fluid or possible paraovarian cystic structure adjacent to the left adnexa.            Radiologist location ID: [id number]             EKG:  No results found for this visit on 04/16/24 (from the past 720 hours).    Medical Decision Making  Presents for severe menstrual cramping. She reports history of endometriosis and follows with specialist at [facility].  She reports taking Ibuprofen  and Midol  without relief. She denies fever, denies N/V.  On exam she has no tenderness, guarding or rebound.  Labs unremarkable.  US  shows left ovarian cyst.  She was medicated with Ofirmev  for relief of pain.  Instructed to follow up with her specialist at [facility]    Amount and/or Complexity of Data Reviewed  Labs: ordered. Decision-making details documented in ED Course.  Radiology:  Decision-making details documented in ED Course.    Risk  Prescription drug management.        ED Course:    During the patient's stay in the emergency department, the above listed imaging and/or labs were performed to assist with medical decision  making and were reviewed by myself when available for review.   Relevant prior external notes and tests were reviewed by myself if available.  Patient rechecked and remained stable throughout  remainder of emergency department course.   All questions/concerns addressed, and patient agrees with disposition plan.      ED Course as of 04/16/24 1009   Tue Apr 16, 2024   1003 IMPRESSION:     1.Endometrium measures 2 mm.  2.Small amount of anechoic fluid or possible paraovarian cystic structure adjacent to the left adnexa.     1007 Patient reports pain is relieved.  Instructed to take Tylenol  and Ibuprofen  at the same time for pain relief.       Medications given during ED stay include:  Medications Ordered/Administered in the ED   acetaminophen  (OFIRMEV ) 1,000 mg (10 mg/mL) IV 100 mL (tot vol) (0 mg Intravenous Stopped 04/16/24 0844)   NS bolus infusion 1,000 mL (0 mL Intravenous Stopped 04/16/24 0859)       Current Discharge Medication List          Clinical Impression:   Clinical Impression   Dysmenorrhea (Primary)       Disposition: Discharged        // Channing Hand, CRNP 04/16/2024, 08:13   [facility], [facility] Emergency Department    Parts of this chart were completed in a retrospective fashion due to simultaneous patient care activities in the Emergency Department.             [1]   Social History  Tobacco Use    Smoking status: Never    Smokeless tobacco: Never   Vaping Use    Vaping status: Every Day    Substances: Nicotine    Devices: Pre-filled pod   Substance Use Topics    Alcohol use: Not Currently     Comment: occasionally    Drug use: Not Currently     Types: Marijuana     Comment: has medical marijuana card; smokes marijuana  2 times a day   [2]   Allergies  Allergen Reactions    Sulfamethoxazole-Trimethoprim Hives/ Urticaria

## 2024-04-16 NOTE — ED Nurses Note (Signed)

## 2024-08-12 ENCOUNTER — Encounter (INDEPENDENT_AMBULATORY_CARE_PROVIDER_SITE_OTHER): Payer: Self-pay | Admitting: Family Medicine
